# Patient Record
Sex: Female | Born: 1935
Health system: Southern US, Community
[De-identification: ages and names within clinical notes are randomized; demographics above are authoritative.]

## PROBLEM LIST (undated history)

## (undated) DIAGNOSIS — M810 Age-related osteoporosis without current pathological fracture: Secondary | ICD-10-CM

## (undated) DIAGNOSIS — E785 Hyperlipidemia, unspecified: Secondary | ICD-10-CM

## (undated) DIAGNOSIS — Z9889 Other specified postprocedural states: Secondary | ICD-10-CM

## (undated) DIAGNOSIS — E039 Hypothyroidism, unspecified: Secondary | ICD-10-CM

## (undated) DIAGNOSIS — E0590 Thyrotoxicosis, unspecified without thyrotoxic crisis or storm: Secondary | ICD-10-CM

## (undated) DIAGNOSIS — H3530 Unspecified macular degeneration: Secondary | ICD-10-CM

## (undated) DIAGNOSIS — I350 Nonrheumatic aortic (valve) stenosis: Secondary | ICD-10-CM

## (undated) DIAGNOSIS — B029 Zoster without complications: Secondary | ICD-10-CM

## (undated) DIAGNOSIS — M1990 Unspecified osteoarthritis, unspecified site: Secondary | ICD-10-CM

## (undated) DIAGNOSIS — R112 Nausea with vomiting, unspecified: Secondary | ICD-10-CM

## (undated) DIAGNOSIS — K219 Gastro-esophageal reflux disease without esophagitis: Secondary | ICD-10-CM

## (undated) DIAGNOSIS — J309 Allergic rhinitis, unspecified: Secondary | ICD-10-CM

## (undated) HISTORY — DX: Hyperlipidemia, unspecified: E78.5

## (undated) HISTORY — DX: Gastro-esophageal reflux disease without esophagitis: K21.9

## (undated) HISTORY — PX: TONSILLECTOMY: SUR1361

## (undated) HISTORY — DX: Age-related osteoporosis without current pathological fracture: M81.0

## (undated) HISTORY — DX: Unspecified macular degeneration: H35.30

## (undated) HISTORY — DX: Allergic rhinitis, unspecified: J30.9

## (undated) HISTORY — PX: OTHER SURGICAL HISTORY: SHX169

## (undated) HISTORY — DX: Nonrheumatic aortic (valve) stenosis: I35.0

## (undated) HISTORY — DX: Thyrotoxicosis, unspecified without thyrotoxic crisis or storm: E05.90

## (undated) HISTORY — PX: DILATION AND CURETTAGE OF UTERUS: SHX78

## (undated) HISTORY — DX: Hypothyroidism, unspecified: E03.9

---

## 1982-02-07 HISTORY — PX: ABDOMINAL HYSTERECTOMY: SHX81

## 1988-02-08 HISTORY — PX: JOINT REPLACEMENT: SHX530

## 1999-02-11 ENCOUNTER — Encounter: Admission: RE | Admit: 1999-02-11 | Discharge: 1999-02-11 | Payer: Self-pay | Admitting: Rheumatology

## 1999-02-11 ENCOUNTER — Encounter: Payer: Self-pay | Admitting: Rheumatology

## 2001-07-03 ENCOUNTER — Encounter: Admission: RE | Admit: 2001-07-03 | Discharge: 2001-07-03 | Payer: Self-pay | Admitting: Endocrinology

## 2001-07-03 ENCOUNTER — Encounter: Payer: Self-pay | Admitting: Surgery

## 2001-07-03 ENCOUNTER — Encounter: Payer: Self-pay | Admitting: Endocrinology

## 2001-07-03 ENCOUNTER — Encounter (INDEPENDENT_AMBULATORY_CARE_PROVIDER_SITE_OTHER): Payer: Self-pay | Admitting: Specialist

## 2001-07-03 ENCOUNTER — Inpatient Hospital Stay (HOSPITAL_COMMUNITY): Admission: AD | Admit: 2001-07-03 | Discharge: 2001-07-06 | Payer: Self-pay | Admitting: Surgery

## 2001-07-04 ENCOUNTER — Encounter: Payer: Self-pay | Admitting: Surgery

## 2003-02-08 HISTORY — PX: CHOLECYSTECTOMY: SHX55

## 2008-05-06 ENCOUNTER — Ambulatory Visit (HOSPITAL_COMMUNITY): Admission: RE | Admit: 2008-05-06 | Discharge: 2008-05-06 | Payer: Self-pay | Admitting: Pediatrics

## 2008-09-03 ENCOUNTER — Encounter: Admission: RE | Admit: 2008-09-03 | Discharge: 2008-09-03 | Payer: Self-pay | Admitting: Endocrinology

## 2009-12-14 ENCOUNTER — Inpatient Hospital Stay (HOSPITAL_COMMUNITY)
Admission: RE | Admit: 2009-12-14 | Discharge: 2009-12-18 | Payer: Self-pay | Source: Home / Self Care | Admitting: Orthopedic Surgery

## 2010-01-06 ENCOUNTER — Inpatient Hospital Stay (HOSPITAL_COMMUNITY)
Admission: EM | Admit: 2010-01-06 | Discharge: 2010-01-10 | Payer: Self-pay | Source: Home / Self Care | Admitting: Emergency Medicine

## 2010-02-07 HISTORY — PX: OTHER SURGICAL HISTORY: SHX169

## 2010-03-02 ENCOUNTER — Inpatient Hospital Stay (HOSPITAL_COMMUNITY)
Admission: RE | Admit: 2010-03-02 | Discharge: 2010-03-04 | Payer: Self-pay | Source: Home / Self Care | Attending: Orthopedic Surgery | Admitting: Orthopedic Surgery

## 2010-03-03 LAB — COMPREHENSIVE METABOLIC PANEL
ALT: 11 U/L (ref 0–35)
AST: 14 U/L (ref 0–37)
CO2: 27 mEq/L (ref 19–32)
Calcium: 8.9 mg/dL (ref 8.4–10.5)
Chloride: 104 mEq/L (ref 96–112)
Creatinine, Ser: 0.58 mg/dL (ref 0.4–1.2)
GFR calc non Af Amer: >60 mL/min (ref >60)
Glucose, Bld: 103 mg/dL — ABNORMAL HIGH (ref 70–99)
Total Bilirubin: 0.4 mg/dL (ref 0.3–1.2)

## 2010-03-03 LAB — CBC
MCH: 31.5 pg (ref 26.0–34.0)
MCHC: 32.4 g/dL (ref 30.0–36.0)
MCV: 97.3 fL (ref 78.0–100.0)
Platelets: 305 10*3/uL (ref 150–400)
RDW: 13.6 % (ref 11.5–15.5)

## 2010-03-03 LAB — URINALYSIS, ROUTINE W REFLEX MICROSCOPIC
Bilirubin Urine: NEGATIVE
Hgb urine dipstick: NEGATIVE
Ketones, ur: NEGATIVE mg/dL
Nitrite: NEGATIVE
Protein, ur: NEGATIVE mg/dL
Specific Gravity, Urine: 1.003 — ABNORMAL LOW (ref 1.005–1.030)
Urobilinogen, UA: 0.2 mg/dL (ref 0.0–1.0)

## 2010-03-03 LAB — SURGICAL PCR SCREEN: Staphylococcus aureus: NEGATIVE

## 2010-03-03 LAB — PROTIME-INR: Prothrombin Time: 13.7 seconds (ref 11.6–15.2)

## 2010-03-10 NOTE — Op Note (Signed)
[name], [name]              ACCOUNT NO.:  [id number]  MEDICAL RECORD NO.:  [id number]          PATIENT TYPE:  AMB  LOCATION:  DAY                          FACILITY:  [facility]  PHYSICIAN:  Ollen Gross, M.D.    DATE OF BIRTH:  May 01, 1935  DATE OF PROCEDURE:  03/02/2010 DATE OF DISCHARGE:                              OPERATIVE REPORT   PREOPERATIVE DIAGNOSIS:  Left patella periprosthetic fracture.  POSTOPERATIVE DIAGNOSIS:  Left patella periprosthetic fracture.  PROCEDURE:  Left patellar reconstruction.  SURGEON:  Ollen Gross, M.D.  ASSISTANT:  Avel Peace, PA-C  ANESTHESIA:  General.  ESTIMATED BLOOD LOSS:  Minimal.  DRAINS:  None.  TOURNIQUET TIME:  35 minutes for 300 mmHg.  COMPLICATIONS:  None.  CONDITION:  Stable to recovery.  BRIEF CLINICAL NOTE:  [name] is a 75 year old female, who had a left total knee arthroplasty done a couple of months ago.  She sustained a periprosthetic patella fracture about 2-3 weeks postoperative, history of open reduction and internal fixation, recently she noticed increased discomfort and inability to raise the leg.  It was noted that the repair disrupted and the superior fragment had migrated proximally. Unfortunately, this was a situation that could not the rectified without further operative treatment.  She presents now for repeat ORIF of the patella.  PROCEDURE IN DETAIL:  After successful administration of general anesthetic, a tourniquet was placed on her left thigh and left lower extremity was prepped and draped in the usual sterile fashion. Extremities were wrapped in Esmarch and tourniquet inflated to 300 mmHg. Previous incisions were reutilized, skin cut with 10 blade through subcutaneous tissue to the level of the extensor mechanism.  It was noted that there was a transverse tear in the retinaculum.  This was at the level of the fracture and coursing medial and lateral.  This was all tissue that had been  repaired previously.  We inspected the joint and thoroughly irrigated the joint to remove any hematoma.  I then removed the previous hardware.  We freshened up the edges of the bone on both sides.  I felt that due to the configuration of the fracture, any of the metal hardware was not going to stay given a small inferior fragment.  I decided to place suture anchors into the bone and then used the sutures to approximate the superior and inferior fragments.  We placed a Mitek super anchor in the superior fragment as well as on the inferior fragment.  We then passed the sutures through drill holes with the inferior sutures going through the holes in the superior fragment and the superior sutures going through the holes in the inferior fragment. We then reduced the two bony fragments.  There was fair amount of comminution involved, thus it was not perfectly imposed on the anterior side.  On the posterior aspect towards the joint, there is bone posing up and gap fully closed.  We then tied the sutures off and then tied them into each other with a very stable repair.  It was flexed down pass 90 and it was not gapping at all.  I then oversewed all of the  retinaculum with interrupted #1 Ethibond, again with flexion well pass 90 without this gapping at all.  The tourniquet was released total time of 35 minutes.  The subcutaneous tissues then closed with interrupted 2- 0 Vicryl subcuticular running 4-0 Monocryl.  The incision was then cleaned and dried and Steri-Strips and bulky sterile dressing were applied, placed in a knee immobilizer, awakened and transported to recovery in stable condition.     Ollen Gross, M.D.     FA/MEDQ  D:  03/02/2010  T:  03/02/2010  Job:  [id number]  Electronically Signed by Ollen Gross M.D. on 03/10/2010 03:36:55 PM

## 2010-04-20 LAB — CBC
HCT: 27 % — ABNORMAL LOW (ref 36.0–46.0)
HCT: 28.4 % — ABNORMAL LOW (ref 36.0–46.0)
HCT: 36.7 % (ref 36.0–46.0)
Hemoglobin: 11.5 g/dL — ABNORMAL LOW (ref 12.0–15.0)
Hemoglobin: 12.2 g/dL (ref 12.0–15.0)
MCH: 34.1 pg — ABNORMAL HIGH (ref 26.0–34.0)
MCH: 34.7 pg — ABNORMAL HIGH (ref 26.0–34.0)
MCH: 34.7 pg — ABNORMAL HIGH (ref 26.0–34.0)
MCHC: 33.5 g/dL (ref 30.0–36.0)
MCHC: 34.6 g/dL (ref 30.0–36.0)
MCV: 100.4 fL — ABNORMAL HIGH (ref 78.0–100.0)
MCV: 100.9 fL — ABNORMAL HIGH (ref 78.0–100.0)
MCV: 101.7 fL — ABNORMAL HIGH (ref 78.0–100.0)
RBC: 3.37 MIL/uL — ABNORMAL LOW (ref 3.87–5.11)
RDW: 13.1 % (ref 11.5–15.5)
RDW: 13.3 % (ref 11.5–15.5)
WBC: 7.8 10*3/uL (ref 4.0–10.5)
WBC: 8 10*3/uL (ref 4.0–10.5)
WBC: 9.1 10*3/uL (ref 4.0–10.5)

## 2010-04-20 LAB — DIFFERENTIAL
Lymphocytes Relative: 14 % (ref 12–46)
Monocytes Absolute: 0.7 10*3/uL (ref 0.1–1.0)
Monocytes Relative: 9 % (ref 3–12)
Neutro Abs: 6.2 10*3/uL (ref 1.7–7.7)

## 2010-04-20 LAB — BASIC METABOLIC PANEL
BUN: 8 mg/dL (ref 6–23)
BUN: 8 mg/dL (ref 6–23)
CO2: 25 mEq/L (ref 19–32)
CO2: 29 mEq/L (ref 19–32)
CO2: 30 mEq/L (ref 19–32)
Calcium: 8.3 mg/dL — ABNORMAL LOW (ref 8.4–10.5)
Chloride: 102 mEq/L (ref 96–112)
Chloride: 103 mEq/L (ref 96–112)
Chloride: 104 mEq/L (ref 96–112)
Chloride: 108 mEq/L (ref 96–112)
Creatinine, Ser: 0.56 mg/dL (ref 0.4–1.2)
GFR calc Af Amer: >60 mL/min (ref >60)
GFR calc non Af Amer: >60 mL/min (ref >60)
Glucose, Bld: 122 mg/dL — ABNORMAL HIGH (ref 70–99)
Glucose, Bld: 144 mg/dL — ABNORMAL HIGH (ref 70–99)
Glucose, Bld: 173 mg/dL — ABNORMAL HIGH (ref 70–99)
Glucose, Bld: 208 mg/dL — ABNORMAL HIGH (ref 70–99)
Potassium: 4 mEq/L (ref 3.5–5.1)
Potassium: 4.1 mEq/L (ref 3.5–5.1)
Potassium: 4.3 mEq/L (ref 3.5–5.1)
Sodium: 137 mEq/L (ref 135–145)
Sodium: 139 mEq/L (ref 135–145)
Sodium: 139 mEq/L (ref 135–145)

## 2010-04-20 LAB — TYPE AND SCREEN
ABO/RH(D): A POS
Antibody Screen: NEGATIVE

## 2010-04-20 LAB — PROTIME-INR: Prothrombin Time: 22.2 seconds — ABNORMAL HIGH (ref 11.6–15.2)

## 2010-04-21 LAB — CBC
HCT: 38.1 % (ref 36.0–46.0)
MCH: 34.4 pg — ABNORMAL HIGH (ref 26.0–34.0)
MCV: 100.7 fL — ABNORMAL HIGH (ref 78.0–100.0)
Platelets: 247 10*3/uL (ref 150–400)
RBC: 3.79 MIL/uL — ABNORMAL LOW (ref 3.87–5.11)
RDW: 12.9 % (ref 11.5–15.5)

## 2010-04-21 LAB — COMPREHENSIVE METABOLIC PANEL
ALT: 11 U/L (ref 0–35)
AST: 17 U/L (ref 0–37)
Calcium: 9.3 mg/dL (ref 8.4–10.5)
GFR calc Af Amer: >60 mL/min (ref >60)
Glucose, Bld: 101 mg/dL — ABNORMAL HIGH (ref 70–99)
Sodium: 143 mEq/L (ref 135–145)
Total Protein: 7 g/dL (ref 6.0–8.3)

## 2010-04-21 LAB — URINALYSIS, ROUTINE W REFLEX MICROSCOPIC
Glucose, UA: NEGATIVE mg/dL
Specific Gravity, Urine: 1.005 (ref 1.005–1.030)
pH: 6.5 (ref 5.0–8.0)

## 2010-04-21 LAB — SURGICAL PCR SCREEN
MRSA, PCR: NEGATIVE
Staphylococcus aureus: NEGATIVE

## 2010-04-21 LAB — PROTIME-INR: Prothrombin Time: 13.3 seconds (ref 11.6–15.2)

## 2010-06-25 NOTE — H&P (Signed)
[facility]  Patient:    [name], [name] Visit Number: [id number] MRN: [id number]          Service Type: MED Location: [phone] Attending Physician:  Katha Cabal Dictated by:   Thornton Park Daphine Deutscher, M.D. Admit Date:  07/03/2001   CC:         Alfonse Alpers. Dagoberto Ligas, M.D.  Aundra Dubin, M.D.   History and Physical  DATE OF BIRTH:  1935/05/02  CHIEF COMPLAINT:  Right upper quadrant pain.  HISTORY OF PRESENT ILLNESS:  The patient is a 75 year old lady who started having pain in the back on last Thursday before the [facility] Day weekend. This progressed to a stomach ache and abdominal pain.  She saw Dr. Dagoberto Ligas this morning, Tuesday, May 73, 2003, and was sent for a gallbladder ultrasound. This showed thickened gallbladder wall with a dilated gallbladder. Arrangements were made for her to be seen in the urgent office, and she is seen at about 6 p.m. today, and is felt to be in need for admission this evening for surgery in the morning.  At the present time, she is still having some tenderness in the right upper quadrant, but does not appear to be otherwise toxic.  A book explaining laparoscopic as well as open cholecystectomy was given to her.  Arrangements were made for her to be placed on the surgery schedule for in the morning at 8:30.  ALLERGIES:  The patient is allergic to SULFA DRUGS in childhood and cannot take CELEBREX.  CURRENT MEDICATIONS:  Premarin daily, Nexium daily, prednisone 2.5 mg twice a week, methotrexate seven pills once a week on Thursday.  PAST SURGICAL HISTORY:  Prior surgery includes a laparoscopy, vaginal hysterectomy, a right knee joint replacement, and wrist surgeries on both wrists.  PAST MEDICAL HISTORY:  Medical conditions include rheumatoid arthritis for which she occasionally takes steroids and is followed by Dr. Kellie Simmering.  She has a history of hyperthyroidism treated in 1983 with PTU and with  normal thyroid function studies.  Mild obesity with current weight of 211.  She has a dyslipidemia with an LDL of 167 for which she has been started on Lipitor.  PHYSICAL EXAMINATION:  GENERAL:  A pleasant lady in no acute distress, but is uncomfortable.  HEENT:  Unremarkable and there is no scleral icterus.  Pupils are equal, round and reactive to light.  NECK:  Supple.  CHEST:  Clear.  HEART:  Sinus rhythm without murmurs or gallops.  ABDOMEN:  Exam reveals the presence of a laparoscopy scar from the previous laparoscopy, but no prior laparotomy.  EXTREMITIES:  Full range of motion with prior knee surgery as mentioned.  LABORATORY DATA:  This morning showed a hemoglobin of 13.6 with a white count of 11,400.  Total bilirubin 0.5 with normal LFTs.  Cholesterol mildly elevated at 217.  IMPRESSION:  Cholecystitis in a patient with rheumatoid arthritis who also takes steroids.  PLAN:  Admit this evening for IV therapy including IV antibiotics, IV pain medications, hydration for laparoscopic cholecystectomy at 0830 hours on Wednesday, Jul 04, 2001. Dictated by:   Thornton Park Daphine Deutscher, M.D. Attending Physician:  Katha Cabal DD:  07/03/01 TD:  07/03/01 Job: [id number]

## 2010-06-25 NOTE — Discharge Summary (Signed)
[facility]  Patient:    [name], [name] Visit Number: [id number] MRN: [id number]          Service Type: MED Location: [phone] Attending Physician:  Katha Cabal Dictated by:   Thornton Park Daphine Deutscher, M.D. Admit Date:  07/03/2001 Discharge Date: 07/06/2001                             Discharge Summary  ADMITTING DIAGNOSIS:  Acute cholecystitis.  DISCHARGE DIAGNOSIS:  Acute and chronic cholecystitis.  PROCEDURE:  Laparoscopic cholecystectomy with intraoperative cholangiogram on Jul 04, 2001.  HOSPITAL COURSE:  [name] is a 75 year old lady with multiple comorbid conditions not limited to steroid-dependent rheumatoid arthritis.  She presented with a four-day history of abdominal pain and was felt to have acute and subacute cholecystitis.  She was admitted the evening before her surgery the next morning to the office with acute cholecystitis.  She was begun on IV antibiotics preop and carried on those postoperatively.  She underwent a successful laparoscopic cholecystectomy with intraoperative cholangiogram on May 28.  She had a modest amount of drainage postop that was all serosanguineous.  She was ready for discharge on Jul 06, 2001.  She was given Vicodin to take for pain and asked to return to the office in two to three weeks for routine follow-up.  FINAL DIAGNOSIS:  Acute and chronic cholecystitis. Dictated by:   Thornton Park Daphine Deutscher, M.D. Attending Physician:  Katha Cabal DD:  07/06/01 TD:  07/09/01 Job: [id number]

## 2010-06-25 NOTE — Op Note (Signed)
[facility]  Patient:    [name], [name] Visit Number: [id number] MRN: [id number]          Service Type: MED Location: [phone] Attending Physician:  Katha Cabal Dictated by:   Thornton Park Daphine Deutscher, M.D. Admit Date:  07/03/2001   CC:         Alfonse Alpers. Dagoberto Ligas, M.D.  Aundra Dubin, M.D.   Operative Report  PREOPERATIVE DIAGNOSIS:  Acute cholecystitis.  POSTOPERATIVE DIAGNOSIS:  Acute and subacute cholecystitis.  OPERATION:  Laparoscopic cholecystectomy with intraoperative cholangiogram.  SURGEON:  Thornton Park. Daphine Deutscher, M.D.  ASSISTANT:  Gita Kudo, M.D.  FINDINGS:  Markedly inflamed gallbladder with multiple stones and impacted stone in neck, cholangiogram showed good filling of the common bile duct and intrahepatic radicles with free flow into the duodenum.  Marked inflammatory changes and friable gallbladder and gallbladder bed.  DRAINS:  One Blake drain left in the subhepatic space.  INDICATION FOR PROCEDURE:   Mrs. Sallie is a 75 year old lady who takes steroids for rheumatoid arthritis who was admitted with a four day history of unrelenting right upper quadrant and abdominal pain and an ultrasound which showed gallbladder wall thickness.  She was placed on IV antibiotics and hydration and scheduled for cholecystectomy at the first available room on Jul 04, 2001.  The patient had an 8:30 time.  DESCRIPTION OF PROCEDURE:  She was taken to room one and given general anesthesia.  She received Rocephin preoperatively.  The abdomen was prepped with Betadine and draped sterilely.  A transverse incision was used going through an old laparoscopy incision in the infraumbilical area.  So we made a little longitudinal incision in the fascia and then through a pursestring suture inserted the Hasson cannula.  The abdomen was insufflated and three ports were placed in the upper abdomen.  A large mass of omentum was swept down  off the liver and was stuck to the gallbladder.  This was carefully peeled away from the gallbladder revealing a very ashen gray edematous gallbladder.  There was no frank necrosis or perforation and so I initially inserted the suction device into the fundus and decompressed this.  It had been quite tense and ungraspable before but with decompression I was able to grasp it and elevate it.  I then found the infundibulum to be fairly edematous and inflamed and somewhat fatty infiltrated.  I incised the peritoneum and was able to delineate the infundibulum, the cystic duct and the cystic duct junction with the gallbladder and the rest of Calots triangle.  Clipped was placed upon the gallbladder and I incised the cystic duct and milked out the impacted stone. These appeared to be cholesterol stones and the cystic duct was thus clear.  Reddick catheter was inserted percutaneously and placed into the common duct.  This was about 2 cm in the cystic duct and with the balloon up I did a dynamic cholangiogram which showed filling of the intrahepatic radicles as well as free flow into the duodenum.  The cystic duct was then triple clipped and divided. The cystic arteries were identified, doubly clipped and divided.  Then using a combination of the hook and the spatula, I took out this very edematous gallbladder.  The liver bed itself was quite viable and she did have probably more bleeding in that area than most but certainly went along with the inflammatory changes we encountered.  The gallbladder in the posterior wall was attenuated and entered and I used the scoop  to remove about 20 stones to prevent spillage.  We were then able to get the gallbladder out and place it in a bag which was hence brought out through the umbilicus without difficulty.  The gallbladder bed was reinspected and irrigated.  There was an oozing area up in the top of the fundus which I cauterized with the electrocautery and  then placed some Surgicel.  It was not actively bleeding.  No bowel leaks were noted.  The [place] drain was brought in through the lateral most port and placed in the gallbladder bed and along the liver.  Umbilical port was tied down under direct vision and a good secure closure of this was present. All the port sites were injected with 0.5% Sensorcaine and all irrigant was withdrawn.  The remaining portion of the abdominal contents appeared to be unaffected.  The omentum was placed back up in the gallbladder bed.  A Jackson-Pratt drain was secured with 3-0 Nylon and placed to drainage.  The patient seemed to tolerate the procedure well.  She did receive some Decadron preoperatively since she has been on chronic steroids.  Skin was closed with 4-0 Vicryl subcuticularly with Benzoin and Steri-Strips. The patient seemed to tolerate the procedure well and was taken to the recovery room prior to return back to her room.  FINAL DIAGNOSIS:  Acute and subacute cholecystitis, status post laparoscopic cholecystectomy with intraoperative cholangiogram. Dictated by:   Thornton Park Daphine Deutscher, M.D. Attending Physician:  Katha Cabal DD:  07/04/01 TD:  07/05/01 Job: [id number]

## 2010-08-20 ENCOUNTER — Other Ambulatory Visit: Payer: Self-pay | Admitting: Orthopedic Surgery

## 2010-08-20 ENCOUNTER — Encounter (HOSPITAL_COMMUNITY): Payer: BC Managed Care – PPO

## 2010-08-20 LAB — URINALYSIS, ROUTINE W REFLEX MICROSCOPIC
Glucose, UA: NEGATIVE mg/dL
Hgb urine dipstick: NEGATIVE
Ketones, ur: NEGATIVE mg/dL
Protein, ur: NEGATIVE mg/dL
Urobilinogen, UA: 0.2 mg/dL (ref 0.0–1.0)

## 2010-08-20 LAB — SURGICAL PCR SCREEN: MRSA, PCR: NEGATIVE

## 2010-08-20 LAB — CBC
HCT: 40 % (ref 36.0–46.0)
Hemoglobin: 12.6 g/dL (ref 12.0–15.0)
MCH: 31.7 pg (ref 26.0–34.0)
MCHC: 31.5 g/dL (ref 30.0–36.0)
RDW: 14.2 % (ref 11.5–15.5)

## 2010-08-20 LAB — COMPREHENSIVE METABOLIC PANEL
ALT: 8 U/L (ref 0–35)
Alkaline Phosphatase: 60 U/L (ref 39–117)
BUN: 10 mg/dL (ref 6–23)
CO2: 30 mEq/L (ref 19–32)
Chloride: 102 mEq/L (ref 96–112)
Glucose, Bld: 95 mg/dL (ref 70–99)
Potassium: 4.1 mEq/L (ref 3.5–5.1)
Sodium: 139 mEq/L (ref 135–145)
Total Bilirubin: 0.3 mg/dL (ref 0.3–1.2)

## 2010-08-20 LAB — PROTIME-INR: Prothrombin Time: 12.7 seconds (ref 11.6–15.2)

## 2010-08-27 ENCOUNTER — Inpatient Hospital Stay (HOSPITAL_COMMUNITY)
Admission: RE | Admit: 2010-08-27 | Discharge: 2010-08-29 | DRG: 227 | Disposition: A | Discharge status: Home Health | Payer: BC Managed Care – PPO | Source: Ambulatory Visit | Attending: Orthopedic Surgery | Admitting: Orthopedic Surgery

## 2010-08-27 DIAGNOSIS — M069 Rheumatoid arthritis, unspecified: Secondary | ICD-10-CM | POA: Diagnosis present

## 2010-08-27 DIAGNOSIS — Z01812 Encounter for preprocedural laboratory examination: Secondary | ICD-10-CM

## 2010-08-27 DIAGNOSIS — M66269 Spontaneous rupture of extensor tendons, unspecified lower leg: Principal | ICD-10-CM | POA: Diagnosis present

## 2010-08-27 DIAGNOSIS — Z96659 Presence of unspecified artificial knee joint: Secondary | ICD-10-CM

## 2010-08-27 DIAGNOSIS — K219 Gastro-esophageal reflux disease without esophagitis: Secondary | ICD-10-CM | POA: Diagnosis present

## 2010-08-30 NOTE — Op Note (Signed)
[name], [name]              ACCOUNT NO.:  [id number]  MEDICAL RECORD NO.:  [id number]  LOCATION:  1620                         FACILITY:  [facility]  PHYSICIAN:  Ollen Gross, M.D.    DATE OF BIRTH:  November 21, 1935  DATE OF PROCEDURE:  08/27/2010 DATE OF DISCHARGE:                              OPERATIVE REPORT   PREOPERATIVE DIAGNOSIS:  Chronic left patellar tendon rupture.  POSTOPERATIVE DIAGNOSIS:  Chronic left patellar tendon rupture.  PROCEDURE:  Left extensor mechanism repair.  SURGEON:  Ollen Gross, MD  ASSISTANT:  Alexzandrew L. Julien Girt, PAC  ANESTHESIA:  General.  ESTIMATED BLOOD LOSS:  Minimal.  DRAINS:  None.  TOURNIQUET TIME:  37 minutes at 300 mmHg.  COMPLICATIONS:  None.  CONDITION:  Stable to Recovery.  BRIEF CLINICAL NOTE:  [name] is a 75 year old female with long complex history in regard to left knee.  She had a primary total knee arthroplasty, then ended up having a patella fracture.  We attempted repair x2.  She went on to develop a secondary rupture of the extensor mechanism.  She presents now for attempted repair versus patellectomy.  PROCEDURE IN DETAIL:  After successful administration of general anesthetic, a tourniquet was placed high on the left thigh, and left lower extremity prepped and draped in usual sterile fashion.  Extremity was wrapped in an Esmarch, tourniquet inflated to 300 mmHg.  Previous midline incision was utilized to skin cut with #10 blade through subcutaneous tissue to the extensor mechanism.  There was obvious defect there.  Patella was riding high.  There was separation from a smaller inferior pole fragment.  I removed that smaller fragment.  There was a previous suture anchor in place which was also removed.  I then thoroughly irrigated the joint with saline.  With Metzenbaum scissors were used to lyse any adhesions in the joint.  I was then able to mobilize the patellar button down to its appropriate position in  the trochlear groove of the femoral component.  There was plenty of redundant tissue inferiorly overlying the tendon.  I mobilized the tendon and redundant tissue.  I felt that given the good mobility of the quadriceps mechanism and the patellar component that I will be able to repair this to the tissue inferiorly.  I felt there was enough tissue there also to do the primary repair and then reinforce it with tissue as opposed to repairing under tension.  We then took FiberWire suture #2 and did a Bunnell type weave, starting one suture from superomedial and one suture superolateral.  We took this through the end of the superior tissues and then sewed that into the inferior tissue applying tension, so that we would hold the patella down in a reduced position.  This effectively reduced it down to its anatomic position.  We tied the sutures then oversewed the tissue and then tied the remaining sutured to each other.  We then took redundant tissue and created a flap from inferior to superior.  This was sewed with the #2 Ethibond.  I felt we had a very stable repair.  I was able to flex against gravity 70 degrees without having any tension on the suture line.  I did not go beyond that given that this was a revision repair. We did further irrigation with saline.  The tourniquet was then released at total time of 37 minutes.  Subcutaneous was closed with interrupted 2- 0 Vicryl and subcuticular with running 4-0 Monocryl.  Incision was cleaned and dried and Steri-Strips and a bulky sterile dressing applied. She was then awakened and transported to Recovery in stable condition.     Ollen Gross, M.D.     FA/MEDQ  D:  08/27/2010  T:  08/28/2010  Job:  [id number]  Electronically Signed by Ollen Gross M.D. on 08/30/2010 04:42:28 PM

## 2010-09-20 NOTE — Discharge Summary (Signed)
  NAMEWILHELMINE, [name]              ACCOUNT NO.:  [id number]  MEDICAL RECORD NO.:  [id number]  LOCATION:  1620                         FACILITY:  [facility]  PHYSICIAN:  Ollen Gross, M.D.    DATE OF BIRTH:  [birth year]  DATE OF ADMISSION:  08/27/2010 DATE OF DISCHARGE:  08/29/2010                              DISCHARGE SUMMARY   ADMITTING DIAGNOSES: 1. Chronic left patellar tendon rupture. 2. History of tobacco use. 3. Hiatal hernia. 4. Reflux disease. 5. Chronic steroids. 6. Rheumatoid arthritis. 7. Postmenopausal. 8. Hyperlipidemia.  DISCHARGE DIAGNOSES: 1. Chronic left patellar tendon rupture, status post left extensor     mechanism repair. 2. History of tobacco use. 3. Hiatal hernia. 4. Reflux disease. 5. Chronic steroids. 6. Rheumatoid arthritis. 7. Postmenopausal. 8. Hyperlipidemia.  PROCEDURE:  On August 27, 2010, left knee extensor mechanism repair. Surgeon - Dr. Lequita Halt.  Assistant - Alexzandrew Julien Girt, P.A.C. Anesthesia - General.  Tourniquet Time - 37 minutes.  CONSULTS:  None.  BRIEF HISTORY:  Ms. [name] is a 75 year old female with a long complex history with regard to her left knee, had a primary total knee and then had a patellar fracture attempted repair twice.  Now, presents for a repair versus patellectomy.  LABORATORY DATA:  No labs taken.  HOSPITAL COURSE:  The patient was admitted to [facility], taken to OR, underwent above-stated procedure without complication.  The patient tolerated the procedure well.  Later, transferred to recovery room in orthopedic floor, started on p.o. and IV analgesics.  She has actually done pretty well on the morning of day #1, encouraged p.o. meds, and allowed to be up out of bed, weightbearing as tolerated with knee immobilizer at all times.  By day #2, she was doing well.  She had been up walking with PT.  We changed the dressing, incision looked good, and she did well with therapy and was later  discharged home that day.  DISCHARGE/PLAN: 1. The patient was discharged home on August 29, 2010. 2. Discharge diagnoses, please see above. 3. Discharge meds:  OxyIR, Robaxin.  Continue home meds. 4. Follow up in 2 weeks. 5. Activity:  Weightbearing as tolerated.  No motion.  Knee     immobilizer at all times.  DISPOSITION:  Home.  CONDITION UPON DISCHARGE:  Improved.     Alexzandrew L. Julien Girt, P.A.C.   ______________________________ Ollen Gross, M.D.    ALP/MEDQ  D:  09/09/2010  T:  09/10/2010  Job:  [id number]  Electronically Signed by Patrica Duel P.A.C. on 09/10/2010 09:41:29 AM Electronically Signed by Ollen Gross M.D. on 09/20/2010 11:36:01 AM

## 2010-09-20 NOTE — H&P (Signed)
  [name], [name]              ACCOUNT NO.:  [id number]  MEDICAL RECORD NO.:  [id number]  LOCATION:  1620                         FACILITY:  [facility]  PHYSICIAN:  Ollen Gross, M.D.    DATE OF BIRTH:  Jan 08, 1936  DATE OF ADMISSION:  08/27/2010 DATE OF DISCHARGE:  08/29/2010                             HISTORY & PHYSICAL   CHIEF COMPLAINT:  Left knee pain.  HISTORY OF PRESENT ILLNESS:  The patient is a 75 year old female with long complex history in regards to her left knee.  She had a primary total knee and had a patella fracture.  We attempted to repair twice. She has had a secondary rupture of the extensor mechanism and now is admitted for repair versus patellectomy.  ALLERGIES:  SULFA DRUGS, MORPHINE, DILAUDID; nausea, causes insomnia.  CURRENT MEDICATIONS:  Folic acid, Nexium, Crestor, methotrexate, Enbrel.  PAST MEDICAL HISTORY.:  Past history of tobacco use, hiatal hernia, reflux disease, chronic steroids, rheumatoid arthritis, postmenopausal hyperlipidemia.  PAST SURGICAL HISTORY:  Right total knee arthroplasty, hysterectomy, left total knee arthroplasty, right wrist surgery, left hand surgery, cholecystectomy, and attempted repair of patella tendon ruptures.  FAMILY HISTORY:  Father passed at 44 of colon cancer.  Mother passed at 47 of throat cancer.  SOCIAL HISTORY:  Divorced.  Previous smoker, infrequent alcohol, 3 children.  REVIEW OF SYSTEMS:  GENERAL:  No fevers, chills, or night sweats. NEURO:  No seizures or paralysis.  RESPIRATORY:  No shortness breath, productive cough, or hemoptysis.  CARDIOVASCULAR:  No chest pain or orthopnea.  GI: No nausea, vomiting, diarrhea, or constipation.  GU: No dysuria, hematuria, or discharge.  MUSCULOSKELETAL:  Knee pain.  PHYSICAL EXAM:  VITAL SIGNS:  Temperature 97.3, pulse 81, respirations 16, blood pressure 155/88. GENERAL:  The patient is 75 year old white female, well-nourished, well- developed, no acute distress,  alert, cooperative, but overweight. HEENT:  Normocephalic, atraumatic.  Pupils are reactive.  EOMs intact. NECK:  Supple. CHEST:  Clear. HEART:  Regular rate and rhythm. ABDOMEN:  Round, soft, protuberant abdomen. Bowel sounds present. RESPIRATORY EXAM:  Not done per the patient. EXTREMITIES:  Left knee; no effusion. Anterior incision well healed. Lacks extensor mechanism strength.  IMPRESSION:  Ruptured left knee extensor mechanism.  PLAN:  Admit for a patellectomy versus tendon repair.     Ollen Gross, M.D.     FA/MEDQ  D:  09/09/2010  T:  09/10/2010  Job:  [id number]  Electronically Signed by Ollen Gross M.D. on 09/20/2010 11:35:58 AM

## 2011-04-28 ENCOUNTER — Other Ambulatory Visit: Payer: Self-pay | Admitting: Orthopedic Surgery

## 2011-07-14 ENCOUNTER — Encounter (HOSPITAL_COMMUNITY): Payer: Self-pay | Admitting: Pharmacy Technician

## 2011-07-15 ENCOUNTER — Encounter (HOSPITAL_COMMUNITY)
Admission: RE | Admit: 2011-07-15 | Discharge: 2011-07-15 | Disposition: A | Discharge status: Home / Self Care | Payer: BC Managed Care – PPO | Source: Ambulatory Visit | Attending: Orthopedic Surgery | Admitting: Orthopedic Surgery

## 2011-07-15 ENCOUNTER — Encounter (HOSPITAL_COMMUNITY): Payer: Self-pay

## 2011-07-15 HISTORY — DX: Unspecified osteoarthritis, unspecified site: M19.90

## 2011-07-15 HISTORY — DX: Nausea with vomiting, unspecified: R11.2

## 2011-07-15 HISTORY — DX: Other specified postprocedural states: Z98.890

## 2011-07-15 LAB — URINALYSIS, ROUTINE W REFLEX MICROSCOPIC
Leukocytes, UA: NEGATIVE
Protein, ur: NEGATIVE mg/dL
Urobilinogen, UA: 0.2 mg/dL (ref 0.0–1.0)

## 2011-07-15 LAB — CBC
HCT: 40.9 % (ref 36.0–46.0)
Hemoglobin: 13.2 g/dL (ref 12.0–15.0)
MCH: 32.4 pg (ref 26.0–34.0)
MCHC: 32.3 g/dL (ref 30.0–36.0)

## 2011-07-15 LAB — COMPREHENSIVE METABOLIC PANEL
BUN: 14 mg/dL (ref 6–23)
Calcium: 9.7 mg/dL (ref 8.4–10.5)
GFR calc Af Amer: >90 mL/min (ref >90)
Glucose, Bld: 97 mg/dL (ref 70–99)
Sodium: 136 mEq/L (ref 135–145)
Total Protein: 7 g/dL (ref 6.0–8.3)

## 2011-07-15 LAB — PROTIME-INR: Prothrombin Time: 12.8 seconds (ref 11.6–15.2)

## 2011-07-15 LAB — SURGICAL PCR SCREEN: Staphylococcus aureus: NEGATIVE

## 2011-07-15 NOTE — Pre-Procedure Instructions (Signed)
Reviewed pre-op instructions using Teach back method. 

## 2011-07-15 NOTE — Patient Instructions (Signed)
[address] FE [place]  07/15/2011   Your procedure is scheduled on:  Thursday 07/21/2011 at 1230pm  Report to [facility] at 1000 AM.  Call this number if you have problems the morning of surgery: [phone]   Remember:   Do not eat food:After Midnight.  May have clear liquids:until Midnight .    Take these medicines the morning of surgery with A SIP OF WATER: Crestor   Do not wear jewelry, make-up or nail polish.  Do not wear lotions, powders, or perfumes.   Do not shave 48 hours prior to surgery. Men may shave face and neck.  Do not bring valuables to the hospital.  Contacts, dentures or bridgework may not be worn into surgery.  Leave suitcase in the car. After surgery it may be brought to your room.  For patients admitted to the hospital, checkout time is 11:00 AM the day of discharge.      Special Instructions: CHG Shower Use Special Wash: 1/2 bottle night before surgery and 1/2 bottle morning of surgery.   Please read over the following fact sheets that you were given: MRSA Information, sleep apnea sheet, incentive spirometry sheet Blood transfusion sheet                 IF you have any questions, please call Telford Nab.Davette Nugent,RN,BSN at [phone]

## 2011-07-21 ENCOUNTER — Inpatient Hospital Stay (HOSPITAL_COMMUNITY)
Admission: AD | Admit: 2011-07-21 | Discharge: 2011-07-23 | DRG: 222 | Disposition: A | Discharge status: Home Health | Payer: BC Managed Care – PPO | Source: Ambulatory Visit | Attending: Orthopedic Surgery | Admitting: Orthopedic Surgery

## 2011-07-21 ENCOUNTER — Encounter (HOSPITAL_COMMUNITY): Payer: Self-pay | Admitting: *Deleted

## 2011-07-21 ENCOUNTER — Ambulatory Visit (HOSPITAL_COMMUNITY): Payer: BC Managed Care – PPO | Admitting: Anesthesiology

## 2011-07-21 ENCOUNTER — Encounter (HOSPITAL_COMMUNITY): Payer: Self-pay | Admitting: Anesthesiology

## 2011-07-21 ENCOUNTER — Encounter (HOSPITAL_COMMUNITY)
Admission: AD | Disposition: A | Discharge status: Home Health | Payer: Self-pay | Source: Ambulatory Visit | Attending: Orthopedic Surgery

## 2011-07-21 DIAGNOSIS — Z79899 Other long term (current) drug therapy: Secondary | ICD-10-CM

## 2011-07-21 DIAGNOSIS — Z87891 Personal history of nicotine dependence: Secondary | ICD-10-CM

## 2011-07-21 DIAGNOSIS — M069 Rheumatoid arthritis, unspecified: Secondary | ICD-10-CM | POA: Diagnosis present

## 2011-07-21 DIAGNOSIS — S86819A Strain of other muscle(s) and tendon(s) at lower leg level, unspecified leg, initial encounter: Secondary | ICD-10-CM | POA: Diagnosis present

## 2011-07-21 DIAGNOSIS — Z01812 Encounter for preprocedural laboratory examination: Secondary | ICD-10-CM

## 2011-07-21 DIAGNOSIS — M66269 Spontaneous rupture of extensor tendons, unspecified lower leg: Secondary | ICD-10-CM | POA: Diagnosis present

## 2011-07-21 DIAGNOSIS — Z96659 Presence of unspecified artificial knee joint: Secondary | ICD-10-CM

## 2011-07-21 HISTORY — PX: PATELLAR TENDON REPAIR: SHX737

## 2011-07-21 LAB — TYPE AND SCREEN: ABO/RH(D): A POS

## 2011-07-21 SURGERY — REPAIR, TENDON, PATELLAR
Anesthesia: General | Site: Knee | Laterality: Left | Wound class: Clean

## 2011-07-21 MED ORDER — METHOCARBAMOL 500 MG PO TABS
500.0000 mg | ORAL_TABLET | Freq: Four times a day (QID) | ORAL | Status: DC | PRN
  Administered 2011-07-21 – 2011-07-23 (×5): 500 mg via ORAL
  Filled 2011-07-21 (×5): qty 1

## 2011-07-21 MED ORDER — CEFAZOLIN SODIUM 1-5 GM-% IV SOLN
1.0000 g | Freq: Four times a day (QID) | INTRAVENOUS | Status: AC
  Administered 2011-07-21 – 2011-07-22 (×3): 1 g via INTRAVENOUS
  Filled 2011-07-21 (×3): qty 50

## 2011-07-21 MED ORDER — CEFAZOLIN SODIUM-DEXTROSE 2-3 GM-% IV SOLR
2.0000 g | INTRAVENOUS | Status: AC
  Administered 2011-07-21: 2 g via INTRAVENOUS

## 2011-07-21 MED ORDER — ATORVASTATIN CALCIUM 20 MG PO TABS
20.0000 mg | ORAL_TABLET | Freq: Every day | ORAL | Status: DC
  Filled 2011-07-21 (×3): qty 1

## 2011-07-21 MED ORDER — PROMETHAZINE HCL 25 MG/ML IJ SOLN
INTRAMUSCULAR | Status: AC
  Filled 2011-07-21: qty 1

## 2011-07-21 MED ORDER — LACTATED RINGERS IV SOLN: INTRAVENOUS | Status: DC

## 2011-07-21 MED ORDER — ETOMIDATE 2 MG/ML IV SOLN
INTRAVENOUS | Status: DC | PRN
  Administered 2011-07-21: 10 mg via INTRAVENOUS

## 2011-07-21 MED ORDER — PROPOFOL 10 MG/ML IV EMUL
INTRAVENOUS | Status: DC | PRN
  Administered 2011-07-21: 100 mg via INTRAVENOUS

## 2011-07-21 MED ORDER — OXYMETAZOLINE HCL 0.05 % NA SOLN: 2.0000 | Freq: Two times a day (BID) | NASAL | Status: DC | PRN

## 2011-07-21 MED ORDER — TEMAZEPAM 15 MG PO CAPS: 15.0000 mg | ORAL_CAPSULE | Freq: Every evening | ORAL | Status: DC | PRN

## 2011-07-21 MED ORDER — PREDNISONE 20 MG PO TABS
20.0000 mg | ORAL_TABLET | Freq: Two times a day (BID) | ORAL | Status: AC
  Administered 2011-07-22 (×2): 20 mg via ORAL
  Filled 2011-07-21 (×2): qty 1

## 2011-07-21 MED ORDER — DOCUSATE SODIUM 100 MG PO CAPS
100.0000 mg | ORAL_CAPSULE | Freq: Two times a day (BID) | ORAL | Status: DC
  Administered 2011-07-21 – 2011-07-23 (×4): 100 mg via ORAL

## 2011-07-21 MED ORDER — PREDNISONE 5 MG PO TABS
5.0000 mg | ORAL_TABLET | Freq: Two times a day (BID) | ORAL | Status: DC
  Filled 2011-07-21: qty 1

## 2011-07-21 MED ORDER — FLEET ENEMA 7-19 GM/118ML RE ENEM: 1.0000 | ENEMA | Freq: Once | RECTAL | Status: AC | PRN

## 2011-07-21 MED ORDER — METOCLOPRAMIDE HCL 5 MG/ML IJ SOLN: 5.0000 mg | Freq: Three times a day (TID) | INTRAMUSCULAR | Status: DC | PRN

## 2011-07-21 MED ORDER — HYDROMORPHONE HCL PF 1 MG/ML IJ SOLN
0.2500 mg | INTRAMUSCULAR | Status: DC | PRN
  Administered 2011-07-21 (×4): 0.5 mg via INTRAVENOUS

## 2011-07-21 MED ORDER — BUPIVACAINE 0.25 % ON-Q PUMP SINGLE CATH 300ML
300.0000 mL | INJECTION | Status: DC
  Filled 2011-07-21: qty 300

## 2011-07-21 MED ORDER — ACETAMINOPHEN 10 MG/ML IV SOLN
INTRAVENOUS | Status: AC
  Filled 2011-07-21: qty 100

## 2011-07-21 MED ORDER — PROMETHAZINE HCL 25 MG/ML IJ SOLN
6.2500 mg | INTRAMUSCULAR | Status: DC | PRN
  Administered 2011-07-21: 6.25 mg via INTRAVENOUS

## 2011-07-21 MED ORDER — FENTANYL CITRATE 0.05 MG/ML IJ SOLN
INTRAMUSCULAR | Status: DC | PRN
  Administered 2011-07-21: 50 ug via INTRAVENOUS
  Administered 2011-07-21: 25 ug via INTRAVENOUS
  Administered 2011-07-21: 50 ug via INTRAVENOUS
  Administered 2011-07-21: 25 ug via INTRAVENOUS

## 2011-07-21 MED ORDER — METHOCARBAMOL 100 MG/ML IJ SOLN
500.0000 mg | Freq: Four times a day (QID) | INTRAMUSCULAR | Status: DC | PRN
  Administered 2011-07-21: 500 mg via INTRAVENOUS
  Filled 2011-07-21: qty 5

## 2011-07-21 MED ORDER — HYDROMORPHONE HCL PF 1 MG/ML IJ SOLN
INTRAMUSCULAR | Status: AC
  Filled 2011-07-21: qty 1

## 2011-07-21 MED ORDER — FENTANYL CITRATE 0.05 MG/ML IJ SOLN
INTRAMUSCULAR | Status: AC
  Filled 2011-07-21: qty 2

## 2011-07-21 MED ORDER — LACTATED RINGERS IV SOLN
INTRAVENOUS | Status: DC | PRN
  Administered 2011-07-21 (×2): via INTRAVENOUS

## 2011-07-21 MED ORDER — SODIUM CHLORIDE 0.9 % IV SOLN: INTRAVENOUS | Status: DC

## 2011-07-21 MED ORDER — BISACODYL 10 MG RE SUPP: 10.0000 mg | Freq: Every day | RECTAL | Status: DC | PRN

## 2011-07-21 MED ORDER — PREDNISONE 5 MG PO TABS: 5.0000 mg | ORAL_TABLET | Freq: Every day | ORAL | Status: DC

## 2011-07-21 MED ORDER — ACETAMINOPHEN 10 MG/ML IV SOLN
1000.0000 mg | Freq: Once | INTRAVENOUS | Status: AC
  Administered 2011-07-21: 1000 mg via INTRAVENOUS
  Filled 2011-07-21: qty 100

## 2011-07-21 MED ORDER — PREDNISONE 10 MG PO TABS
10.0000 mg | ORAL_TABLET | Freq: Two times a day (BID) | ORAL | Status: AC
  Administered 2011-07-23: 10 mg via ORAL
  Filled 2011-07-21 (×3): qty 1

## 2011-07-21 MED ORDER — HYDROMORPHONE HCL PF 1 MG/ML IJ SOLN
0.5000 mg | INTRAMUSCULAR | Status: DC | PRN
  Administered 2011-07-21: 0.5 mg via INTRAVENOUS
  Filled 2011-07-21: qty 1

## 2011-07-21 MED ORDER — POLYETHYLENE GLYCOL 3350 17 G PO PACK: 17.0000 g | PACK | Freq: Every day | ORAL | Status: DC | PRN

## 2011-07-21 MED ORDER — METOCLOPRAMIDE HCL 10 MG PO TABS: 5.0000 mg | ORAL_TABLET | Freq: Three times a day (TID) | ORAL | Status: DC | PRN

## 2011-07-21 MED ORDER — FENTANYL CITRATE 0.05 MG/ML IJ SOLN
25.0000 ug | INTRAMUSCULAR | Status: AC | PRN
  Administered 2011-07-21 (×6): 25 ug via INTRAVENOUS

## 2011-07-21 MED ORDER — ONDANSETRON HCL 4 MG/2ML IJ SOLN: 4.0000 mg | Freq: Four times a day (QID) | INTRAMUSCULAR | Status: DC | PRN

## 2011-07-21 MED ORDER — OXYCODONE HCL 5 MG PO TABS
5.0000 mg | ORAL_TABLET | ORAL | Status: DC | PRN
  Administered 2011-07-21 – 2011-07-23 (×13): 10 mg via ORAL
  Filled 2011-07-21 (×13): qty 2

## 2011-07-21 MED ORDER — DEXAMETHASONE SODIUM PHOSPHATE 10 MG/ML IJ SOLN
10.0000 mg | Freq: Once | INTRAMUSCULAR | Status: AC
  Administered 2011-07-21: 10 mg via INTRAVENOUS
  Filled 2011-07-21: qty 1

## 2011-07-21 MED ORDER — CEFAZOLIN SODIUM-DEXTROSE 2-3 GM-% IV SOLR
INTRAVENOUS | Status: AC
  Filled 2011-07-21: qty 50

## 2011-07-21 MED ORDER — ENOXAPARIN SODIUM 40 MG/0.4ML ~~LOC~~ SOLN
40.0000 mg | SUBCUTANEOUS | Status: DC
  Administered 2011-07-22 – 2011-07-23 (×2): 40 mg via SUBCUTANEOUS
  Filled 2011-07-21 (×3): qty 0.4

## 2011-07-21 MED ORDER — 0.9 % SODIUM CHLORIDE (POUR BTL) OPTIME
TOPICAL | Status: DC | PRN
  Administered 2011-07-21: 1000 mL

## 2011-07-21 MED ORDER — KCL IN DEXTROSE-NACL 20-5-0.9 MEQ/L-%-% IV SOLN
INTRAVENOUS | Status: DC
  Administered 2011-07-21: 18:00:00 via INTRAVENOUS
  Filled 2011-07-21 (×2): qty 1000

## 2011-07-21 MED ORDER — PREDNISONE 20 MG PO TABS
20.0000 mg | ORAL_TABLET | Freq: Every day | ORAL | Status: AC
  Administered 2011-07-21: 20 mg via ORAL
  Filled 2011-07-21: qty 1

## 2011-07-21 MED ORDER — ONDANSETRON HCL 4 MG PO TABS
4.0000 mg | ORAL_TABLET | Freq: Four times a day (QID) | ORAL | Status: DC | PRN
  Administered 2011-07-22: 4 mg via ORAL
  Filled 2011-07-21: qty 1

## 2011-07-21 MED ORDER — ACETAMINOPHEN 10 MG/ML IV SOLN
1000.0000 mg | Freq: Four times a day (QID) | INTRAVENOUS | Status: AC
  Administered 2011-07-21 – 2011-07-22 (×4): 1000 mg via INTRAVENOUS
  Filled 2011-07-21 (×6): qty 100

## 2011-07-21 MED ORDER — MEPERIDINE HCL 50 MG/ML IJ SOLN: 6.2500 mg | INTRAMUSCULAR | Status: DC | PRN

## 2011-07-21 SURGICAL SUPPLY — 56 items
BAG SPEC THK2 15X12 ZIP CLS (MISCELLANEOUS) ×1
BAG ZIPLOCK 12X15 (MISCELLANEOUS) ×2 IMPLANT
BANDAGE ELASTIC 6 VELCRO ST LF (GAUZE/BANDAGES/DRESSINGS) ×1 IMPLANT
BANDAGE ESMARK 6X9 LF (GAUZE/BANDAGES/DRESSINGS) ×1 IMPLANT
BANDAGE GAUZE ELAST BULKY 4 IN (GAUZE/BANDAGES/DRESSINGS) ×1 IMPLANT
BIT DRILL 2.0MX128MM (BIT) ×2 IMPLANT
BIT DRILL 2.8X128 (BIT) ×2 IMPLANT
BNDG CMPR 9X6 STRL LF SNTH (GAUZE/BANDAGES/DRESSINGS) ×1
BNDG COHESIVE 6X5 TAN STRL LF (GAUZE/BANDAGES/DRESSINGS) ×1 IMPLANT
BNDG ESMARK 6X9 LF (GAUZE/BANDAGES/DRESSINGS) ×2
CLOTH BEACON ORANGE TIMEOUT ST (SAFETY) ×2 IMPLANT
DECANTER SPIKE VIAL GLASS SM (MISCELLANEOUS) ×1 IMPLANT
DRAPE U-SHAPE 47X51 STRL (DRAPES) ×2 IMPLANT
DRILL BIT 7/64X5 (BIT) ×1 IMPLANT
DRSG ADAPTIC 3X8 NADH LF (GAUZE/BANDAGES/DRESSINGS) ×1 IMPLANT
DRSG EMULSION OIL 3X16 NADH (GAUZE/BANDAGES/DRESSINGS) ×2 IMPLANT
DRSG PAD ABDOMINAL 8X10 ST (GAUZE/BANDAGES/DRESSINGS) ×2 IMPLANT
DURAPREP 26ML APPLICATOR (WOUND CARE) ×2 IMPLANT
ELECT REM PT RETURN 9FT ADLT (ELECTROSURGICAL)
ELECTRODE REM PT RTRN 9FT ADLT (ELECTROSURGICAL) ×1 IMPLANT
GLOVE BIO SURGEON STRL SZ7.5 (GLOVE) ×2 IMPLANT
GLOVE BIO SURGEON STRL SZ8 (GLOVE) ×2 IMPLANT
GLOVE BIOGEL PI IND STRL 8 (GLOVE) ×2 IMPLANT
GLOVE BIOGEL PI INDICATOR 8 (GLOVE) ×2
GOWN STRL NON-REIN LRG LVL3 (GOWN DISPOSABLE) ×2 IMPLANT
GOWN STRL REIN XL XLG (GOWN DISPOSABLE) ×2 IMPLANT
GRAFT ACHILLES CALC BNE BLCK (Bone Implant) IMPLANT
GRAFT ACHILLES TENDON (Bone Implant) ×2 IMPLANT
IMMOBILIZER KNEE 20 (SOFTGOODS) ×2
IMMOBILIZER KNEE 20 THIGH 36 (SOFTGOODS) IMPLANT
MANIFOLD NEPTUNE II (INSTRUMENTS) ×2 IMPLANT
NDL MA TROC 1/2 (NEEDLE) ×1 IMPLANT
NEEDLE MA TROC 1/2 (NEEDLE) IMPLANT
PACK TOTAL JOINT (CUSTOM PROCEDURE TRAY) ×2 IMPLANT
PADDING CAST COTTON 6X4 STRL (CAST SUPPLIES) ×1 IMPLANT
PASSER SUT SWANSON 36MM LOOP (INSTRUMENTS) ×2 IMPLANT
POSITIONER SURGICAL ARM (MISCELLANEOUS) ×2 IMPLANT
SCREW CANC PT 4.0X28 (Screw) ×1 IMPLANT
SCREW CANC PT 4.0X35 (Screw) ×1 IMPLANT
SPONGE GAUZE 4X4 12PLY (GAUZE/BANDAGES/DRESSINGS) ×2 IMPLANT
SPONGE LAP 18X18 X RAY DECT (DISPOSABLE) ×3 IMPLANT
SPONGE LAP 4X18 X RAY DECT (DISPOSABLE) ×2 IMPLANT
STRIP CLOSURE SKIN 1/2X4 (GAUZE/BANDAGES/DRESSINGS) ×1 IMPLANT
SUT ETHIBOND NAB CT1 #1 30IN (SUTURE) ×6 IMPLANT
SUT FIBERWIRE #2 38 T-5 BLUE (SUTURE) ×2
SUT MNCRL AB 4-0 PS2 18 (SUTURE) ×2 IMPLANT
SUT VIC AB 0 CT1 27 (SUTURE)
SUT VIC AB 0 CT1 27XBRD ANTBC (SUTURE) ×2 IMPLANT
SUT VIC AB 1 CT1 27 (SUTURE) ×4
SUT VIC AB 1 CT1 27XBRD ANTBC (SUTURE) ×1 IMPLANT
SUT VIC AB 2-0 CT1 27 (SUTURE) ×10
SUT VIC AB 2-0 CT1 TAPERPNT 27 (SUTURE) ×1 IMPLANT
SUTURE FIBERWR #2 38 T-5 BLUE (SUTURE) ×1 IMPLANT
TOWEL OR 17X26 10 PK STRL BLUE (TOWEL DISPOSABLE) ×4 IMPLANT
WASHER 7MM DIA (Washer) ×2 IMPLANT
WATER STERILE IRR 1500ML POUR (IV SOLUTION) ×1 IMPLANT

## 2011-07-21 NOTE — Plan of Care (Signed)
Problem: Diagnosis - Type of Surgery Goal: General Surgical Patient Education (See Patient Education module for education specifics) Left patella tendon repair

## 2011-07-21 NOTE — Anesthesia Preprocedure Evaluation (Addendum)
Anesthesia Evaluation  Patient identified by MRN, date of birth, ID band Patient awake    Reviewed: Allergy & Precautions, H&P , NPO status , Patient's Chart, lab work & pertinent test results  History of Anesthesia Complications (+) PONV  Airway Mallampati: II TM Distance: >3 FB Neck ROM: Full    Dental No notable dental hx. (+) Dental Advisory Given   Pulmonary neg pulmonary ROS,  breath sounds clear to auscultation  Pulmonary exam normal       Cardiovascular negative cardio ROS  Rhythm:Regular Rate:Normal     Neuro/Psych negative neurological ROS  negative psych ROS   GI/Hepatic negative GI ROS, Neg liver ROS,   Endo/Other  negative endocrine ROS  Renal/GU negative Renal ROS  negative genitourinary   Musculoskeletal negative musculoskeletal ROS (+) Arthritis -, Rheumatoid disorders,    Abdominal   Peds negative pediatric ROS (+)  Hematology negative hematology ROS (+)   Anesthesia Other Findings 3 perm bridges. Front teeth bonded  Reproductive/Obstetrics negative OB ROS                          Anesthesia Physical Anesthesia Plan  ASA: II  Anesthesia Plan: General   Post-op Pain Management:    Induction: Intravenous  Airway Management Planned: LMA  Additional Equipment:   Intra-op Plan:   Post-operative Plan: Extubation in OR  Informed Consent: I have reviewed the patients History and Physical, chart, labs and discussed the procedure including the risks, benefits and alternatives for the proposed anesthesia with the patient or authorized representative who has indicated his/her understanding and acceptance.   Dental advisory given  Plan Discussed with: CRNA  Anesthesia Plan Comments:         Anesthesia Quick Evaluation

## 2011-07-21 NOTE — Brief Op Note (Signed)
07/21/2011  2:41 PM  PATIENT:  [name]  76 y.o. 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  Gus Rankin Floris Neuhaus, MD    07/21/2011, 2:47 PM

## 2011-07-21 NOTE — Transfer of Care (Signed)
Immediate Anesthesia Transfer of Care Note  Patient: [name]  Procedure(s) Performed: Procedure(s) (LRB): PATELLA TENDON REPAIR (Left)  Patient Location: PACU  Anesthesia Type: General  Level of Consciousness: awake, alert , sedated and patient cooperative  Airway & Oxygen Therapy: Patient Spontanous Breathing and Patient connected to face mask oxygen  Post-op Assessment: Report given to PACU RN and Post -op Vital signs reviewed and stable  Post vital signs: Reviewed and stable  Complications: No apparent anesthesia complications

## 2011-07-21 NOTE — Anesthesia Postprocedure Evaluation (Signed)
  Anesthesia Post-op Note  Patient: [name]  Procedure(s) Performed: Procedure(s) (LRB): PATELLA TENDON REPAIR (Left)  Patient Location: PACU  Anesthesia Type: General  Level of Consciousness: awake and alert   Airway and Oxygen Therapy: Patient Spontanous Breathing  Post-op Pain: mild  Post-op Assessment: Post-op Vital signs reviewed, Patient's Cardiovascular Status Stable, Respiratory Function Stable, Patent Airway and No signs of Nausea or vomiting  Post-op Vital Signs: stable  Complications: Redness of periorbital skin from eye taping during surgery.  Sclera clear. No vision problems. Should resolve with time. Discussed with patient.

## 2011-07-21 NOTE — Interval H&P Note (Signed)
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 After consideration of risks, benefits and other options for treatment, the patient has consented to  Procedure(s) (LRB): PATELLA TENDON REPAIR (Left) as a surgical intervention .  The patients' history has been reviewed, patient examined, no change in status, stable for surgery.  I have reviewed the patients' chart and labs.  Questions were answered to the patient's satisfaction.     Loanne Drilling

## 2011-07-21 NOTE — H&P (Signed)
Subjective:   Patient is a 76 y.o. female presents with patella tendon and extensor mechanism dysfunction. The pain is located left knee. Patient describes the pain as continuous and rated as mild and moderate. Pain has been associated with activity.  Symptoms are aggravated by weight bearing. Symptoms improve with rest. It is felt that the patient would best be served by surgical repair.  There are no active problems to display for this patient.  Past Medical History  Diagnosis Date  . PONV (postoperative nausea and vomiting)   . Arthritis     rheumatoid    Past Surgical History  Procedure Date  . Abdominal hysterectomy 1984    ovarys left in  . Tonsillectomy     as child  . Cholecystectomy 2005  . Joint replacement 1990    right knee  . Wrist bilateral [id number]    for arthritis  . Dilation and curettage of uterus   . Left knee replacement 2012    x2 surgeries-ruptures patella tendon, fractured patella    Prescriptions prior to admission  Medication Sig Dispense Refill  . acetaminophen (TYLENOL) 500 MG tablet Take 1,000 mg by mouth every 6 (six) hours as needed. For headache      . estrogens, conjugated, (PREMARIN) 0.3 MG tablet Take 0.3 mg by mouth daily with breakfast. Take daily for 21 days then do not take for 7 days.      Marland Kitchen etanercept (ENBREL) 50 MG/ML injection Inject 50 mg into the skin once a week. Every week on Tuesday.      . folic acid (FOLVITE) 1 MG tablet Take 1 mg by mouth daily with breakfast.      . methotrexate (RHEUMATREX) 2.5 MG tablet Take 10 mg by mouth See admin instructions. Patient takes 4 tablets by mouth on Friday and Saturday.      Marland Kitchen OVER THE COUNTER MEDICATION Take 1 tablet by mouth 2 (two) times daily. Algae-Cal=750mg  Calcium, 65mg  Magnesium, and 1000 units of Vitamin D      . oxymetazoline (AFRIN) 0.05 % nasal spray Place 2 sprays into the nose as needed. For congestion      . predniSONE (DELTASONE) 5 MG tablet Take 5 mg by mouth daily as needed.  For arthritis flare ups      . rosuvastatin (CRESTOR) 10 MG tablet Take 10 mg by mouth daily with breakfast.      . vitamin C (ASCORBIC ACID) 500 MG tablet Take 500 mg by mouth 2 (two) times daily.      Marland Kitchen VITAMIN D, ERGOCALCIFEROL, PO Take 10,000 Units by mouth once a week. On Wednesdays      . Zinc 50 MG CAPS Take 50 mg by mouth 2 (two) times daily.      . diphenhydrAMINE (BENADRYL) 12.5 MG chewable tablet Chew 12.5 mg by mouth every 4 (four) hours as needed. For allergies       Allergies  Allergen Reactions  . Sulfa Antibiotics     Hives as child-age 69    History  Substance Use Topics  . Smoking status: Former Smoker -- 0.5 packs/day for 15 years    Types: Cigarettes  . Smokeless tobacco: Former Neurosurgeon    Quit date: 07/14/2004  . Alcohol Use: No    History reviewed. No pertinent family history.  Review of Systems Constitutional: negative Respiratory: negative Cardiovascular: negative Gastrointestinal: negative Genitourinary:positive for dysuria but has been treated recently for UTI Musculoskeletal:negative except for knee dysfunction  Objective:   Patient Vitals for the  past 8 hrs:  BP Temp Temp src Pulse Resp SpO2  07/21/11 1019 152/86 mmHg 97.5 F (36.4 C) Oral 83  18  95 %          BP 152/86  Pulse 83  Temp 97.5 F (36.4 C) (Oral)  Resp 18  SpO2 95%  General Appearance:    Alert, cooperative, no distress, appears stated age  Head:    Normocephalic, without obvious abnormality, atraumatic  Eyes:    PER,  EOM's intact, , both eyes           Neck:   Supple no carotid   bruit or JVD     Lungs:     Clear to auscultation bilaterally, respirations unlabored      Heart:    Regular rate and rhythm, S1 and S2 normal, no murmur, rub   or gallop  Breast Exam:    No tenderness, masses, or nipple abnormality  Abdomen:     Soft, non-tender, bowel sounds active all four quadrants,    Round, soft protuberant  Genitalia:  Not done  Rectal:   not done  Extremities:    Extremities normal, atraumatic, no cyanosis or edema 40 degree extensor lag, no laxity, flex to 100 degrees.  Pulses:   2+ and symmetric all extremities  Skin:   Skin color, texture, turgor normal, no rashes or lesions           Assessment:  Left patella tendon and extensor mechanism dusfunction Active Problems:  * No active hospital problems. *    Plan:   Admit for surgical repair of the patella tendon and extensor mechanism dysfunction. Surgery to be performed by Dr. Lequita Halt.  Avel Peace, PA-C

## 2011-07-21 NOTE — Progress Notes (Signed)
Pt has reddened skin on eyelids and states they are painful; Dr. Council Mechanic, anesthesiologist, made aware

## 2011-07-22 ENCOUNTER — Encounter (HOSPITAL_COMMUNITY): Payer: Self-pay | Admitting: Orthopedic Surgery

## 2011-07-22 LAB — CBC
HCT: 35.2 % — ABNORMAL LOW (ref 36.0–46.0)
Hemoglobin: 11.3 g/dL — ABNORMAL LOW (ref 12.0–15.0)
MCH: 32.2 pg (ref 26.0–34.0)
MCHC: 32.1 g/dL (ref 30.0–36.0)
MCV: 100.3 fL — ABNORMAL HIGH (ref 78.0–100.0)

## 2011-07-22 LAB — BASIC METABOLIC PANEL
BUN: 6 mg/dL (ref 6–23)
Chloride: 104 mEq/L (ref 96–112)
Creatinine, Ser: 0.37 mg/dL — ABNORMAL LOW (ref 0.50–1.10)
Glucose, Bld: 192 mg/dL — ABNORMAL HIGH (ref 70–99)
Potassium: 4.1 mEq/L (ref 3.5–5.1)

## 2011-07-22 NOTE — Progress Notes (Signed)
OT Note:  Checked with pt.  She has had multiple knee surgeries, has assist and all DME.  She does not feel she needs OT at this time.  Will sign off; screen only.  [place], OTR/L [phone] 07/22/2011

## 2011-07-22 NOTE — Progress Notes (Signed)
Physical Therapy Treatment Patient Details Name: [name] MRN: [id number] DOB: [birth year] Today's Date: 07/22/2011 Time: 1478-2956 PT Time Calculation (min): 18 min  PT Assessment / Plan / Recommendation Comments on Treatment Session       Follow Up Recommendations  Home health PT    Barriers to Discharge        Equipment Recommendations  None recommended by PT    Recommendations for Other Services OT consult  Frequency 7X/week   Plan Discharge plan remains appropriate    Precautions / Restrictions Precautions Precautions: Other (comment);Fall Precaution Comments: NO ROM AT LEFT KNEE - KI AT ALL TIMES Required Braces or Orthoses: Knee Immobilizer - Left Knee Immobilizer - Left: On at all times Restrictions Weight Bearing Restrictions: No Other Position/Activity Restrictions: WBAT   Pertinent Vitals/Pain 6/10; pain meds requested    Mobility  Bed Mobility Bed Mobility: Sit to Supine Sit to Supine: 4: Min assist Details for Bed Mobility Assistance: cues for sequence and use of R LE to self assist, min assist for L LE Transfers Transfers: Sit to Stand;Stand to Sit Sit to Stand: 4: Min assist Stand to Sit: 4: Min assist Details for Transfer Assistance: cues for use of UEs and for LE management Ambulation/Gait Ambulation/Gait Assistance: 4: Min assist Ambulation Distance (Feet): 25 Feet (x2) Assistive device: Rolling walker Ambulation/Gait Assistance Details: cues for position from RW Gait Pattern: Decreased step length - left;Decreased step length - right;Step-through pattern    Exercises     PT Diagnosis:    PT Problem List:   PT Treatment Interventions:     PT Goals Acute Rehab PT Goals PT Goal Formulation: With patient Time For Goal Achievement: 07/26/11 Potential to Achieve Goals: Good Pt will go Supine/Side to Sit: with supervision PT Goal: Supine/Side to Sit - Progress: Progressing toward goal Pt will go Sit to Supine/Side: with  supervision PT Goal: Sit to Supine/Side - Progress: Progressing toward goal Pt will go Sit to Stand: with supervision PT Goal: Sit to Stand - Progress: Progressing toward goal Pt will go Stand to Sit: with supervision PT Goal: Stand to Sit - Progress: Progressing toward goal Pt will Ambulate: 51 - 150 feet;with supervision;with rolling walker PT Goal: Ambulate - Progress: Progressing toward goal  Visit Information  Last PT Received On: 07/22/11 Assistance Needed: +1    Subjective Data  Subjective: I need to use the bathroom and lie down Patient Stated Goal: Resume previous lifestyle "but I am not going to push it"   Cognition  Overall Cognitive Status: Appears within functional limits for tasks assessed/performed Arousal/Alertness: Awake/alert Orientation Level: Appears intact for tasks assessed Behavior During Session: [facility] for tasks performed    Balance     End of Session PT - End of Session Equipment Utilized During Treatment: Left knee immobilizer Activity Tolerance: Patient tolerated treatment well Patient left: in bed;with call bell/phone within reach;with family/visitor present Nurse Communication: Mobility status    [name] 07/22/2011, 1:07 PM

## 2011-07-22 NOTE — Anesthesia Postprocedure Evaluation (Signed)
  Anesthesia Post-op Note  Patient: [name]  Procedure(s) Performed: Procedure(s) (LRB): PATELLA TENDON REPAIR (Left)  Patient Location: PACU  Anesthesia Type: General  Level of Consciousness: awake and alert   Airway and Oxygen Therapy: Patient Spontanous Breathing  Post-op Pain: mild  Post-op Assessment: Post-op Vital signs reviewed, Patient's Cardiovascular Status Stable, Respiratory Function Stable, Patent Airway and No signs of Nausea or vomiting  Post-op Vital Signs: stable  Complications: No apparent anesthesia complications

## 2011-07-22 NOTE — Progress Notes (Signed)
Subjective: 1 Day Post-Op Procedure(s) (LRB): PATELLA TENDON REPAIR (Left) Patient reports pain as mild.   Patient seen in rounds with Dr. Lequita Halt. Briefly discussed surgical findings. Son in room with patient. Patient is well, and has had no acute complaints or problems We will start therapy today.  Plan is to go Home after hospital stay.  Objective: Vital signs in last 24 hours: Temp:  [97 F (36.1 C)-98.2 F (36.8 C)] 98.1 F (36.7 C) (06/14 0400) Pulse Rate:  [68-83] 69  (06/14 0400) Resp:  [10-18] 16  (06/14 0400) BP: (122-166)/(67-96) 125/78 mmHg (06/14 0400) SpO2:  [95 %-100 %] 96 % (06/14 0400) Weight:  [90.719 kg (200 lb)] 90.719 kg (200 lb) (06/13 1646)  Intake/Output from previous day:  Intake/Output Summary (Last 24 hours) at 07/22/11 0724 Last data filed at 07/22/11 0101  Gross per 24 hour  Intake 2011.25 ml  Output   1300 ml  Net 711.25 ml    Intake/Output this shift:    Labs:  Basename 07/22/11 0345  HGB 11.3*    Basename 07/22/11 0345  WBC 7.2  RBC 3.51*  HCT 35.2*  PLT 267    Basename 07/22/11 0345  NA 137  K 4.1  CL 104  CO2 25  BUN 6  CREATININE 0.37*  GLUCOSE 192*  CALCIUM 8.7   No results found for this basename: LABPT:2,INR:2 in the last 72 hours  EXAM General - Patient is Alert, Appropriate and Oriented Extremity - Neurovascular intact Sensation intact distally Dressing - dressing C/D/I Motor Function - intact, moving foot and toes well on exam.   Past Medical History  Diagnosis Date  . PONV (postoperative nausea and vomiting)   . Arthritis     rheumatoid    Assessment/Plan: 1 Day Post-Op Procedure(s) (LRB): PATELLA TENDON REPAIR (Left) Principal Problem:  *Patellar tendon rupture   Advance diet Up with therapy Discharge home with home health  DVT Prophylaxis - Lovenox Weight-Bearing as tolerated to left leg -  NO BENDING, NO MOTION, NO BENDING!!! No vaccines. DC O2 Probably home over the weekend depending  upon progress Will keep in Knee Immobilizer At All Times until she follows up in the office.  Rebecka Oelkers 07/22/2011, 7:24 AM

## 2011-07-22 NOTE — Progress Notes (Signed)
Physical Therapy Treatment Patient Details Name: [name] MRN: [id number] DOB: April 19, 1935 Today's Date: 07/22/2011 Time: 9562-1308 PT Time Calculation (min): 28 min  PT Assessment / Plan / Recommendation Comments on Treatment Session       Follow Up Recommendations  Home health PT    Barriers to Discharge        Equipment Recommendations  None recommended by PT    Recommendations for Other Services OT consult  Frequency 7X/week   Plan Discharge plan remains appropriate    Precautions / Restrictions Precautions Precaution Comments: NO ROM AT LEFT KNEE - KI AT ALL TIMES Required Braces or Orthoses: Knee Immobilizer - Left Knee Immobilizer - Left: On at all times Restrictions Weight Bearing Restrictions: No Other Position/Activity Restrictions: WBAT   Pertinent Vitals/Pain     Mobility  Bed Mobility Bed Mobility: Supine to Sit;Sit to Supine Supine to Sit: 4: Min assist Sit to Supine: 4: Min assist Details for Bed Mobility Assistance: cues for sequence and use of R LE to self assist, min assist for L LE Transfers Transfers: Sit to Stand;Stand to Sit Sit to Stand: 4: Min assist Stand to Sit: 4: Min assist Details for Transfer Assistance: cues for use of UEs and for LE management Ambulation/Gait Ambulation/Gait Assistance: 4: Min assist Ambulation Distance (Feet): 111 Feet (111 and 25') Assistive device: Rolling walker Ambulation/Gait Assistance Details: min cues for position from RW Gait Pattern: Decreased step length - left;Decreased step length - right;Step-through pattern    Exercises     PT Diagnosis:    PT Problem List:   PT Treatment Interventions:     PT Goals Acute Rehab PT Goals PT Goal Formulation: With patient Time For Goal Achievement: 07/26/11 Potential to Achieve Goals: Good Pt will go Supine/Side to Sit: with supervision PT Goal: Supine/Side to Sit - Progress: Progressing toward goal Pt will go Sit to Supine/Side: with supervision PT  Goal: Sit to Supine/Side - Progress: Progressing toward goal Pt will go Sit to Stand: with supervision PT Goal: Sit to Stand - Progress: Progressing toward goal Pt will go Stand to Sit: with supervision PT Goal: Stand to Sit - Progress: Progressing toward goal Pt will Ambulate: 51 - 150 feet;with supervision;with rolling walker PT Goal: Ambulate - Progress: Progressing toward goal  Visit Information  Last PT Received On: 07/22/11 Assistance Needed: +1    Subjective Data  Subjective: I don't need to do stairs, I know how Patient Stated Goal: Resume previous lifestyle "but I am not going to push it"   Cognition  Overall Cognitive Status: Appears within functional limits for tasks assessed/performed Arousal/Alertness: Awake/alert Orientation Level: Appears intact for tasks assessed Behavior During Session: [facility] for tasks performed    Balance     End of Session PT - End of Session Equipment Utilized During Treatment: Left knee immobilizer Activity Tolerance: Patient tolerated treatment well Patient left: in bed;with call bell/phone within reach;with family/visitor present Nurse Communication: Mobility status    [name] 07/22/2011, 4:55 PM

## 2011-07-22 NOTE — Op Note (Signed)
NAMEDEVANNY, PALECEK              ACCOUNT NO.:  [id number]  MEDICAL RECORD NO.:  [id number]  LOCATION:  1601                         FACILITY:  [facility]  PHYSICIAN:  Ollen Gross, M.D.    DATE OF BIRTH:  1935-04-11  DATE OF PROCEDURE: DATE OF DISCHARGE:                              OPERATIVE REPORT   PREOPERATIVE DIAGNOSIS:  Extensor mechanism disruption, left knee.  POSTOPERATIVE DIAGNOSIS:  Extensor mechanism disruption, left knee.  PROCEDURE:  Patellar tendon/extensor mechanism reconstruction, left knee.  SURGEON:  Ollen Gross, M.D.  ASSISTANT:  Alexzandrew L. Perkins, P.A.C.  ANESTHESIA:  General.  ESTIMATED BLOOD LOSS:  Minimal.  DRAINS:  None.  COMPLICATIONS:  None.  CONDITION:  Stable to recovery.  TOURNIQUET TIME:  70 minutes at 300 mmHg.  BRIEF CLINICAL NOTE:  Ms. Bartles is a 76 year old female with a long complex history in regards to her knee.  She had a total knee arthroplasty and then a patellar tendon/patellar fracture postoperatively and attempted repair on 2 to 3 other occasions. Unfortunately, she has left with extensor disruption and dysfunction. She presents now for reconstruction.  PROCEDURE IN DETAIL:  After successful administration of general anesthetic, a tourniquet was placed high on the left thigh and left lower extremity, prepped and draped in the usual sterile fashion. Extremities wrapped in an Esmarch, knee flexed, tourniquet inflated to 300 mmHg.  A midline incision was utilized.  Skin cut with a 10 blade through subcutaneous tissue to the extensor mechanism.  The patella is in a position far too superior on the femur.  The tissue between the tibial tubercle and the inferior pole of the patella is attenuated and very flimsy.  This was then incised and there is some fluid in the joint which appears clear.  Joint was thoroughly irrigated with saline.  I mobilized the tissue under the patella and mobilized any adhesions between the  clot and the femur.  I was able to get to mobilize the patella back to normal position.  We had discussed preoperatively the use of an allograft to reconstruct the patellar tendon.  An Achilles allograft was thawed and I configured a bone plug so that it would fit into a trough I created in the tibial tubercle.  The trough was created with small osteotomes.  We then laid the bone down into the trough and I used two 4.0 cancellous screws with a washer to secure it.  I tugged down the graft and it was found to be very stable.  We then pulled the patella back to its normal position and I placed tension on the graft and marked the spot where the inferior pole of the patella would meet the graft.  We then placed two #2 Ethibond sutures and weaved them through the tendon starting distally and going proximally with the Bunnell-type weave.  I created 3 holes in the patella from inferior to superior and then passed the sutures through those holes, tying them down to the patella and then tying them to each other.  This created excellent tension on the graft and created a very stable repair.  We then sewed the remaining tendon graft over top of the patella and weaved two #2  Ethibond sutures through the tendon and then weaved them down starting at the quad tendon and then along the edges of the patella and then pulled down further to keep the patella in the reduced position and tied these sutures to each other.  The patient's native tissue that was at the previous position of the tendon was then mobilized and sewn to each other and to the tendon as well as mobilized proximally to again placed more of a force keeping the patella down in its reduced position.  The remainder of the tendon graft was then folded over back distally and the edges of this was sewn down to the tissue to reinforce the repair.  I was very pleased with the repair.  The wound was then copiously irrigated with saline solution and  the tourniquet released, total time 70 minutes.  Subcu was then closed with interrupted 2-0 Vicryl and subcuticular running 4-0 Monocryl.  The incision was then cleaned and dried and Steri-Strips and a bulky sterile dressing applied. She was placed into a knee immobilizer, awakened, and transported to recovery in stable condition.  She is not to do any range of motion on this knee for the next 8 weeks.     Ollen Gross, M.D.     FA/MEDQ  D:  07/21/2011  T:  07/22/2011  Job:  [id number]

## 2011-07-22 NOTE — Evaluation (Signed)
Physical Therapy Evaluation Patient Details Name: [name] MRN: [id number] DOB: [birth year] Today's Date: 07/22/2011 Time: 0812-0855 PT Time Calculation (min): 43 min  PT Assessment / Plan / Recommendation Clinical Impression  Pt wtih L Patellar tendon reconstruction presnets with L LE in KI and with decreased strength causing significant limitations in functional mobility    PT Assessment  Patient needs continued PT services    Follow Up Recommendations  Home health PT    Barriers to Discharge        lEquipment Recommendations  None recommended by PT    Recommendations for Other Services OT consult   Frequency 7X/week    Precautions / Restrictions Precautions Precautions: Other (comment);Fall Precaution Comments: NO ROM AT LEFT KNEE - KI AT ALL TIMES Required Braces or Orthoses: Knee Immobilizer - Left Knee Immobilizer - Left: On at all times Restrictions Weight Bearing Restrictions: No Other Position/Activity Restrictions: WBAT   Pertinent Vitals/Pain 5/10 with activity; pt premedicated and provided with cold packs      Mobility  Bed Mobility Bed Mobility: Supine to Sit Supine to Sit: 4: Min assist Details for Bed Mobility Assistance: cues for sequence, min assist for L LE Transfers Transfers: Sit to Stand;Stand to Sit Sit to Stand: 4: Min assist Stand to Sit: 4: Min assist Details for Transfer Assistance: cues for use of UEs and for LE management Ambulation/Gait Ambulation/Gait Assistance: 4: Min assist Ambulation Distance (Feet): 75 Feet (75' and 25') Assistive device: Rolling walker Ambulation/Gait Assistance Details: cues for posture, position from RW and sequence Gait Pattern: Decreased step length - left;Decreased step length - right;Step-through pattern    Exercises     PT Diagnosis: Difficulty walking  PT Problem List: Decreased strength;Decreased range of motion;Decreased activity tolerance;Decreased balance;Decreased  mobility;Obesity;Pain;Decreased knowledge of use of DME PT Treatment Interventions: DME instruction;Gait training;Stair training;Functional mobility training;Therapeutic activities;Therapeutic exercise;Patient/family education   PT Goals Acute Rehab PT Goals PT Goal Formulation: With patient Time For Goal Achievement: 07/26/11 Potential to Achieve Goals: Good Pt will go Supine/Side to Sit: with supervision PT Goal: Supine/Side to Sit - Progress: Goal set today Pt will go Sit to Supine/Side: with supervision PT Goal: Sit to Supine/Side - Progress: Goal set today Pt will go Sit to Stand: with supervision PT Goal: Sit to Stand - Progress: Goal set today Pt will go Stand to Sit: with supervision PT Goal: Stand to Sit - Progress: Goal set today Pt will Ambulate: 51 - 150 feet;with supervision;with rolling walker PT Goal: Ambulate - Progress: Goal set today Pt will Go Up / Down Stairs: 1-2 stairs;with min assist;with least restrictive assistive device PT Goal: Up/Down Stairs - Progress: Goal set today  Visit Information  Last PT Received On: 07/22/11 Assistance Needed: +2    Subjective Data  Subjective: This is my fifth surgery on this knee and I am not to bend it at all Patient Stated Goal: Resume previous lifestyle "but I am not going to push it"   Prior Functioning  Home Living Lives With: Alone Available Help at Discharge: Friend(s);Family Type of Home: House Home Access: Stairs to enter Secretary/administrator of Steps: 2 Entrance Stairs-Rails: Can reach both Home Layout: One level Home Adaptive Equipment: Walker - rolling Prior Function Level of Independence: Independent with assistive device(s) Able to Take Stairs?: Yes Driving: Yes Communication Communication: No difficulties    Cognition  Overall Cognitive Status: Appears within functional limits for tasks assessed/performed Arousal/Alertness: Awake/alert Orientation Level: Appears intact for tasks  assessed Behavior During Session: [facility]  for tasks performed    Extremity/Trunk Assessment Right Upper Extremity Assessment RUE ROM/Strength/Tone: Within functional levels Left Upper Extremity Assessment LUE ROM/Strength/Tone: WFL for tasks assessed Right Lower Extremity Assessment RLE ROM/Strength/Tone: Deficits RLE ROM/Strength/Tone Deficits: KI on at all times - NO ROM at knee Left Lower Extremity Assessment LLE ROM/Strength/Tone: [facility] for tasks assessed   Balance    End of Session PT - End of Session Equipment Utilized During Treatment: Left knee immobilizer Activity Tolerance: Patient tolerated treatment well Patient left: in chair;with call bell/phone within reach;with family/visitor present Nurse Communication: Mobility status   Rishav Rockefeller 07/22/2011, 9:48 AM

## 2011-07-23 LAB — CBC
MCH: 33.6 pg (ref 26.0–34.0)
MCHC: 33.2 g/dL (ref 30.0–36.0)
MCV: 101.1 fL — ABNORMAL HIGH (ref 78.0–100.0)
Platelets: 327 10*3/uL (ref 150–400)
RBC: 3.48 MIL/uL — ABNORMAL LOW (ref 3.87–5.11)
RDW: 14.2 % (ref 11.5–15.5)

## 2011-07-23 MED ORDER — METHOCARBAMOL 500 MG PO TABS: 500.0000 mg | ORAL_TABLET | Freq: Four times a day (QID) | ORAL | Status: AC | PRN

## 2011-07-23 MED ORDER — ENOXAPARIN SODIUM 40 MG/0.4ML ~~LOC~~ SOLN: 40.0000 mg | SUBCUTANEOUS | Status: DC

## 2011-07-23 MED ORDER — OXYCODONE HCL 5 MG PO TABS: 5.0000 mg | ORAL_TABLET | ORAL | Status: AC | PRN

## 2011-07-23 NOTE — Progress Notes (Signed)
Subjective: Doing well wants to go home   Objective: Vital signs in last 24 hours: Temp:  [97.5 F (36.4 C)-98 F (36.7 C)] 97.5 F (36.4 C) (06/15 0620) Pulse Rate:  [66-76] 67  (06/15 0620) Resp:  [16] 16  (06/15 0620) BP: (125-151)/(76-84) 151/84 mmHg (06/15 0620) SpO2:  [94 %-98 %] 96 % (06/15 0620)  Intake/Output from previous day: 06/14 0701 - 06/15 0700 In: 1920 [P.O.:1920] Out: 1650 [Urine:1650] Intake/Output this shift: Total I/O In: 360 [P.O.:360] Out: 1650 [Urine:1650]   Basename 07/23/11 0535 07/22/11 0345  HGB 11.7* 11.3*    Basename 07/23/11 0535 07/22/11 0345  WBC PENDING 7.2  RBC 3.48* 3.51*  HCT 35.2* 35.2*  PLT 327 267    Basename 07/22/11 0345  NA 137  K 4.1  CL 104  CO2 25  BUN 6  CREATININE 0.37*  GLUCOSE 192*  CALCIUM 8.7   No results found for this basename: LABPT:2,INR:2 in the last 72 hours  Cons alert comfortable, dressing removed wound clean dry no erythema or d/c, calf soft NT  Assessment/Plan: POD#2 S/P Extensor mech allograft repair left TKA doing well Amb 100 ft yest, understands instructions, will d/c on lovenox till F/U in two weeks.  Jamelle Rushing 07/23/2011, 6:34 AM

## 2011-07-23 NOTE — Progress Notes (Signed)
Discharged from floor via w/c, sister with pt. No changes in assessment. [name]   

## 2011-07-23 NOTE — Progress Notes (Signed)
Physical Therapy Treatment Patient Details Name: [name] MRN: [id number] DOB: 04/06/1935 Today's Date: 07/23/2011 Time: 7829-5621 PT Time Calculation (min): 20 min  PT Assessment / Plan / Recommendation Comments on Treatment Session  Pt is eager to D/C to home. Very knowledgable havinh had multiple knee surgeries.    Follow Up Recommendations  Home health PT    Barriers to Discharge        Equipment Recommendations  None recommended by PT    Recommendations for Other Services    Frequency     Plan Discharge plan remains appropriate    Precautions / Restrictions Precautions Precautions: Fall Precaution Comments: NO ROM AT LEFT KNEE - KI AT ALL TIMES Required Braces or Orthoses: Knee Immobilizer - Left Knee Immobilizer - Left: On at all times Restrictions Weight Bearing Restrictions: No Other Position/Activity Restrictions: WBAT   Pertinent Vitals/Pain C/o "soreness"    Mobility  Bed Mobility Bed Mobility: Supine to Sit Supine to Sit: 4: Min guard Details for Bed Mobility Assistance: increased time to maneuver L LE off bed Transfers Transfers: Sit to Stand;Stand to Sit Sit to Stand: 5: Supervision;From bed;From toilet Stand to Sit: 5: Supervision;To toilet;To chair/3-in-1 Details for Transfer Assistance: increased time and one VC on hand palcement with stand to sit Ambulation/Gait Ambulation/Gait Assistance: 4: Min guard Ambulation Distance (Feet): 115 Feet Assistive device: Rolling walker Ambulation/Gait Assistance Details: 25% VC's for safety with turns and side stepping into Bathroom Gait Pattern: Step-to pattern;Decreased stride length Gait velocity: decreased Stairs: No (Pt declined one step practice) Wheelchair Mobility Wheelchair Mobility: No     PT Goals    Visit Information  Last PT Received On: 07/23/11 Assistance Needed: +1                   End of Session PT - End of Session Equipment Utilized During Treatment: Gait  belt Activity Tolerance: Patient tolerated treatment well Patient left: in chair;with call bell/phone within reach;with family/visitor present Nurse Communication: Other (comment) (Pt ready for D/C to home)    Felecia Shelling  PTA [facility]  Acute  Rehab Pager     [phone]

## 2011-07-23 NOTE — Progress Notes (Signed)
CM spoke with pt concerning dc planning. Per pt choice [facility] to provide [facility] services upon dc. Pt has access to Dme. No further HH or DME needs stated. Pt's sister present at bedside to assist in home care. Genevieve Norlander rep notified of discharge.   Leonie Green [phone]

## 2011-07-24 DIAGNOSIS — M069 Rheumatoid arthritis, unspecified: Secondary | ICD-10-CM | POA: Diagnosis not present

## 2011-07-24 DIAGNOSIS — IMO0001 Reserved for inherently not codable concepts without codable children: Secondary | ICD-10-CM | POA: Diagnosis not present

## 2011-07-24 DIAGNOSIS — Z4889 Encounter for other specified surgical aftercare: Secondary | ICD-10-CM | POA: Diagnosis not present

## 2011-08-01 DIAGNOSIS — M069 Rheumatoid arthritis, unspecified: Secondary | ICD-10-CM | POA: Diagnosis not present

## 2011-08-01 DIAGNOSIS — IMO0001 Reserved for inherently not codable concepts without codable children: Secondary | ICD-10-CM | POA: Diagnosis not present

## 2011-08-01 DIAGNOSIS — Z4889 Encounter for other specified surgical aftercare: Secondary | ICD-10-CM | POA: Diagnosis not present

## 2011-08-08 DIAGNOSIS — IMO0001 Reserved for inherently not codable concepts without codable children: Secondary | ICD-10-CM | POA: Diagnosis not present

## 2011-08-08 DIAGNOSIS — M069 Rheumatoid arthritis, unspecified: Secondary | ICD-10-CM | POA: Diagnosis not present

## 2011-08-08 DIAGNOSIS — Z4889 Encounter for other specified surgical aftercare: Secondary | ICD-10-CM | POA: Diagnosis not present

## 2011-08-15 NOTE — Discharge Summary (Signed)
Physician Discharge Summary   Patient ID: [name] MRN: [id number] DOB/AGE: May 06, 1935 76 y.o.  Admit date: 07/21/2011 Discharge date: 07/23/2011  Primary Diagnosis: Left patella tendon and extensor mechanism dusfunction   Admission Diagnoses:  Past Medical History  Diagnosis Date  . PONV (postoperative nausea and vomiting)   . Arthritis     rheumatoid   Discharge Diagnoses:   Principal Problem:  *Patellar tendon rupture  Procedure:  Procedure(s) (LRB): PATELLA TENDON REPAIR (Left)   Consults: None  HPI: Ms. Campa is a 76 year old female with a long complex history in regards to her knee. She had a total knee arthroplasty and then a patellar tendon/patellar fracture postoperatively and attempted repair on 2 to 3 other occasions. Unfortunately, she has left with extensor disruption and dysfunction. She presents now for reconstruction.   Laboratory Data: Hospital Outpatient Visit on 07/15/2011  Component Date Value Range Status  . MRSA, PCR 07/15/2011 NEGATIVE  NEGATIVE Final  . Staphylococcus aureus 07/15/2011 NEGATIVE  NEGATIVE Final   Comment:                                 The Xpert SA Assay (FDA                          approved for NASAL specimens                          only), is one component of                          a comprehensive surveillance                          program.  It is not intended                          to diagnose infection nor to                          guide or monitor treatment.  Marland Kitchen aPTT 07/15/2011 28  24 - 37 seconds Final  . WBC 07/15/2011 12.1* 4.0 - 10.5 K/uL Final  . RBC 07/15/2011 4.08  3.87 - 5.11 MIL/uL Final  . Hemoglobin 07/15/2011 13.2  12.0 - 15.0 g/dL Final  . HCT 30/86/5784 40.9  36.0 - 46.0 % Final  . MCV 07/15/2011 100.2* 78.0 - 100.0 fL Final  . MCH 07/15/2011 32.4  26.0 - 34.0 pg Final  . MCHC 07/15/2011 32.3  30.0 - 36.0 g/dL Final  . RDW 69/62/9528 14.5  11.5 - 15.5 % Final  . Platelets 07/15/2011  237  150 - 400 K/uL Final  . Sodium 07/15/2011 136  135 - 145 mEq/L Final  . Potassium 07/15/2011 4.0  3.5 - 5.1 mEq/L Final  . Chloride 07/15/2011 100  96 - 112 mEq/L Final  . CO2 07/15/2011 26  19 - 32 mEq/L Final  . Glucose, Bld 07/15/2011 97  70 - 99 mg/dL Final  . BUN 41/32/4401 14  6 - 23 mg/dL Final  . Creatinine, Ser 07/15/2011 0.42* 0.50 - 1.10 mg/dL Final  . Calcium 02/72/5366 9.7  8.4 - 10.5 mg/dL Final  . Total Protein 07/15/2011 7.0  6.0 - 8.3 g/dL Final  .  Albumin 07/15/2011 3.5  3.5 - 5.2 g/dL Final  . AST 57/84/6962 10  0 - 37 U/L Final  . ALT 07/15/2011 10  0 - 35 U/L Final  . Alkaline Phosphatase 07/15/2011 55  39 - 117 U/L Final  . Total Bilirubin 07/15/2011 0.4  0.3 - 1.2 mg/dL Final  . GFR calc non Af Amer 07/15/2011 >90  >90 mL/min Final  . GFR calc Af Amer 07/15/2011 >90  >90 mL/min Final   Comment:                                 The eGFR has been calculated                          using the CKD EPI equation.                          This calculation has not been                          validated in all clinical                          situations.                          eGFR's persistently                          <90 mL/min signify                          possible Chronic Kidney Disease.  Marland Kitchen Prothrombin Time 07/15/2011 12.8  11.6 - 15.2 seconds Final  . INR 07/15/2011 0.94  0.00 - 1.49 Final  . Color, Urine 07/15/2011 YELLOW  YELLOW Final  . APPearance 07/15/2011 CLEAR  CLEAR Final  . Specific Gravity, Urine 07/15/2011 1.012  1.005 - 1.030 Final  . pH 07/15/2011 6.0  5.0 - 8.0 Final  . Glucose, UA 07/15/2011 NEGATIVE  NEGATIVE mg/dL Final  . Hgb urine dipstick 07/15/2011 NEGATIVE  NEGATIVE Final  . Bilirubin Urine 07/15/2011 NEGATIVE  NEGATIVE Final  . Ketones, ur 07/15/2011 NEGATIVE  NEGATIVE mg/dL Final  . Protein, ur 95/28/4132 NEGATIVE  NEGATIVE mg/dL Final  . Urobilinogen, UA 07/15/2011 0.2  0.0 - 1.0 mg/dL Final  . Nitrite 44/02/270 NEGATIVE   NEGATIVE Final  . Leukocytes, UA 07/15/2011 NEGATIVE  NEGATIVE Final   MICROSCOPIC NOT DONE ON URINES WITH NEGATIVE PROTEIN, BLOOD, LEUKOCYTES, NITRITE, OR GLUCOSE <1000 mg/dL.   No results found for this basename: HGB:5 in the last 72 hours No results found for this basename: WBC:2,RBC:2,HCT:2,PLT:2 in the last 72 hours No results found for this basename: NA:2,K:2,CL:2,CO2:2,BUN:2,CREATININE:2,GLUCOSE:2,CALCIUM:2 in the last 72 hours No results found for this basename: LABPT:2,INR:2 in the last 72 hours  X-Rays:No results found.  EKG:No orders found for this or any previous visit.   Hospital Course: Patient was admitted to [facility] and taken to the OR and underwent the above state procedure without complications.  Patient tolerated the procedure well and was later transferred to the recovery room and then to the orthopaedic floor for postoperative care.  They were given PO and IV analgesics for pain control following their  surgery.  They were given 24 hours of postoperative antibiotics and started on DVT prophylaxis in the form of Lovenox.   PT and OT were ordered for mobility.  Discharge planning consulted to help with postop disposition and equipment needs.  Patient had a decent night on the evening of surgery and started to get up OOB with therapy on day one. Weight-Bearing as tolerated to left leg - NO BENDING, NO MOTION, NO BENDING!!!  Continued to work with therapy into day two.The patient had progressed with therapy and meeting their goals. Patient was seen in rounds and was ready to go home.  Discharge Medications: Prior to Admission medications   Medication Sig Start Date End Date Taking? Authorizing Provider  acetaminophen (TYLENOL) 500 MG tablet Take 1,000 mg by mouth every 6 (six) hours as needed. For headache   Yes Historical Provider, MD  estrogens, conjugated, (PREMARIN) 0.3 MG tablet Take 0.3 mg by mouth daily with breakfast. Take daily for 21 days then do not take for  7 days.   Yes Historical Provider, MD  etanercept (ENBREL) 50 MG/ML injection Inject 50 mg into the skin once a week. Every week on Tuesday.   Yes Historical Provider, MD  folic acid (FOLVITE) 1 MG tablet Take 1 mg by mouth daily with breakfast.   Yes Historical Provider, MD  methotrexate (RHEUMATREX) 2.5 MG tablet Take 10 mg by mouth See admin instructions. Patient takes 4 tablets by mouth on Friday and Saturday.   Yes Historical Provider, MD  OVER THE COUNTER MEDICATION Take 1 tablet by mouth 2 (two) times daily. Algae-Cal=750mg  Calcium, 65mg  Magnesium, and 1000 units of Vitamin D   Yes Historical Provider, MD  oxymetazoline (AFRIN) 0.05 % nasal spray Place 2 sprays into the nose as needed. For congestion   Yes Historical Provider, MD  predniSONE (DELTASONE) 5 MG tablet Take 5 mg by mouth daily as needed. For arthritis flare ups   Yes Historical Provider, MD  rosuvastatin (CRESTOR) 10 MG tablet Take 10 mg by mouth daily with breakfast.   Yes Historical Provider, MD  vitamin C (ASCORBIC ACID) 500 MG tablet Take 500 mg by mouth 2 (two) times daily.   Yes Historical Provider, MD  VITAMIN D, ERGOCALCIFEROL, PO Take 10,000 Units by mouth once a week. On Wednesdays   Yes Historical Provider, MD  Zinc 50 MG CAPS Take 50 mg by mouth 2 (two) times daily.   Yes Historical Provider, MD  diphenhydrAMINE (BENADRYL) 12.5 MG chewable tablet Chew 12.5 mg by mouth every 4 (four) hours as needed. For allergies    Historical Provider, MD    Diet: Regular diet Activity: Weight-Bearing as tolerated to left leg - NO BENDING, NO MOTION, NO BENDING!!!  Follow-up:in 2 weeks Disposition - Home Discharged Condition: good   Discharge Orders    Future Orders Please Complete By Expires   Diet general      Call MD / Call 911      Comments:   If you experience chest pain or shortness of breath, CALL 911 and be transported to the hospital emergency room.  If you develope a fever above 101 F, pus (white drainage) or  increased drainage or redness at the wound, or calf pain, call your surgeon's office.   Increase activity slowly as tolerated      Discharge instructions      Comments:   Call for a follow up appointment with Dr Lequita Halt in 2 wks. Call [phone]. Do Not Bend Knee, Keep in knee  immobilizer at all times, change dressing daily. Take one 325 Aspirin a day.     Medication List  As of 08/15/2011 10:20 AM   TAKE these medications         acetaminophen 500 MG tablet   Commonly known as: TYLENOL   Take 1,000 mg by mouth every 6 (six) hours as needed. For headache      diphenhydrAMINE 12.5 MG chewable tablet   Commonly known as: BENADRYL   Chew 12.5 mg by mouth every 4 (four) hours as needed. For allergies      estrogens (conjugated) 0.3 MG tablet   Commonly known as: PREMARIN   Take 0.3 mg by mouth daily with breakfast. Take daily for 21 days then do not take for 7 days.      etanercept 50 MG/ML injection   Commonly known as: ENBREL   Inject 50 mg into the skin once a week. Every week on Tuesday.      folic acid 1 MG tablet   Commonly known as: FOLVITE   Take 1 mg by mouth daily with breakfast.      methotrexate 2.5 MG tablet   Commonly known as: RHEUMATREX   Take 10 mg by mouth See admin instructions. Patient takes 4 tablets by mouth on Friday and Saturday.      OVER THE COUNTER MEDICATION   Take 1 tablet by mouth 2 (two) times daily. Algae-Cal=750mg  Calcium, 65mg  Magnesium, and 1000 units of Vitamin D      oxymetazoline 0.05 % nasal spray   Commonly known as: AFRIN   Place 2 sprays into the nose as needed. For congestion      predniSONE 5 MG tablet   Commonly known as: DELTASONE   Take 5 mg by mouth daily as needed. For arthritis flare ups      rosuvastatin 10 MG tablet   Commonly known as: CRESTOR   Take 10 mg by mouth daily with breakfast.      vitamin C 500 MG tablet   Commonly known as: ASCORBIC ACID   Take 500 mg by mouth 2 (two) times daily.      VITAMIN D  (ERGOCALCIFEROL) PO   Take 10,000 Units by mouth once a week. On Wednesdays      Zinc 50 MG Caps   Take 50 mg by mouth 2 (two) times daily.             SignedPatrica Duel 08/15/2011, 10:20 AM

## 2011-08-17 DIAGNOSIS — Z4889 Encounter for other specified surgical aftercare: Secondary | ICD-10-CM | POA: Diagnosis not present

## 2011-08-17 DIAGNOSIS — M069 Rheumatoid arthritis, unspecified: Secondary | ICD-10-CM | POA: Diagnosis not present

## 2011-08-17 DIAGNOSIS — IMO0001 Reserved for inherently not codable concepts without codable children: Secondary | ICD-10-CM | POA: Diagnosis not present

## 2012-09-04 DIAGNOSIS — E669 Obesity, unspecified: Secondary | ICD-10-CM | POA: Diagnosis not present

## 2012-09-04 DIAGNOSIS — R011 Cardiac murmur, unspecified: Secondary | ICD-10-CM | POA: Diagnosis not present

## 2012-09-04 DIAGNOSIS — Z124 Encounter for screening for malignant neoplasm of cervix: Secondary | ICD-10-CM | POA: Diagnosis not present

## 2012-09-04 DIAGNOSIS — Z0000 Encounter for general adult medical examination without abnormal findings: Secondary | ICD-10-CM | POA: Diagnosis not present

## 2012-09-04 DIAGNOSIS — R030 Elevated blood-pressure reading, without diagnosis of hypertension: Secondary | ICD-10-CM | POA: Diagnosis not present

## 2012-09-04 DIAGNOSIS — N390 Urinary tract infection, site not specified: Secondary | ICD-10-CM | POA: Diagnosis not present

## 2012-09-04 DIAGNOSIS — E785 Hyperlipidemia, unspecified: Secondary | ICD-10-CM | POA: Diagnosis not present

## 2012-09-04 DIAGNOSIS — M069 Rheumatoid arthritis, unspecified: Secondary | ICD-10-CM | POA: Diagnosis not present

## 2012-09-05 ENCOUNTER — Other Ambulatory Visit (HOSPITAL_COMMUNITY): Payer: Self-pay | Admitting: Internal Medicine

## 2012-09-05 DIAGNOSIS — R011 Cardiac murmur, unspecified: Secondary | ICD-10-CM

## 2012-09-07 ENCOUNTER — Ambulatory Visit (HOSPITAL_COMMUNITY): Payer: BC Managed Care – PPO

## 2012-09-10 NOTE — Progress Notes (Signed)
Need orders in EPIC.  Surgery scheduled for 8/20.  Thank You.  

## 2012-09-12 ENCOUNTER — Other Ambulatory Visit: Payer: Self-pay | Admitting: Dermatology

## 2012-09-12 DIAGNOSIS — C44319 Basal cell carcinoma of skin of other parts of face: Secondary | ICD-10-CM | POA: Diagnosis not present

## 2012-09-13 ENCOUNTER — Encounter (HOSPITAL_COMMUNITY): Payer: Self-pay | Admitting: Pharmacy Technician

## 2012-09-13 ENCOUNTER — Ambulatory Visit (HOSPITAL_COMMUNITY)
Admission: RE | Admit: 2012-09-13 | Discharge: 2012-09-13 | Disposition: A | Discharge status: Home / Self Care | Payer: Medicare Other | Source: Ambulatory Visit | Attending: Internal Medicine | Admitting: Internal Medicine

## 2012-09-13 DIAGNOSIS — I059 Rheumatic mitral valve disease, unspecified: Secondary | ICD-10-CM | POA: Insufficient documentation

## 2012-09-13 DIAGNOSIS — R011 Cardiac murmur, unspecified: Secondary | ICD-10-CM | POA: Diagnosis not present

## 2012-09-13 NOTE — Progress Notes (Signed)
[name]   2D echo completed 09/13/2012.   Cindy Lewellyn Fultz, RDCS  

## 2012-09-15 ENCOUNTER — Other Ambulatory Visit: Payer: Self-pay | Admitting: Orthopedic Surgery

## 2012-09-15 NOTE — Progress Notes (Signed)
Preoperative surgical orders have been place into the Epic hospital system for [name] on 09/15/2012, 9:50 AM  by Patrica Duel for surgery on 09/26/12.  Preop Knee orders including IV Tylenol, and IV Decadron as long as there are no contraindications to the above medications. Avel Peace, PA-C

## 2012-09-18 DIAGNOSIS — T84049A Periprosthetic fracture around unspecified internal prosthetic joint, initial encounter: Secondary | ICD-10-CM | POA: Diagnosis not present

## 2012-09-18 DIAGNOSIS — S838X9A Sprain of other specified parts of unspecified knee, initial encounter: Secondary | ICD-10-CM | POA: Diagnosis not present

## 2012-09-19 ENCOUNTER — Encounter (HOSPITAL_COMMUNITY): Payer: Self-pay

## 2012-09-19 ENCOUNTER — Encounter (HOSPITAL_COMMUNITY)
Admission: RE | Admit: 2012-09-19 | Discharge: 2012-09-19 | Disposition: A | Discharge status: Home / Self Care | Payer: Medicare Other | Source: Ambulatory Visit | Attending: Orthopedic Surgery | Admitting: Orthopedic Surgery

## 2012-09-19 ENCOUNTER — Other Ambulatory Visit: Payer: Self-pay | Admitting: Orthopedic Surgery

## 2012-09-19 DIAGNOSIS — Z01812 Encounter for preprocedural laboratory examination: Secondary | ICD-10-CM | POA: Diagnosis not present

## 2012-09-19 LAB — CBC
HCT: 39.8 % (ref 36.0–46.0)
MCH: 33.4 pg (ref 26.0–34.0)
MCV: 101.5 fL — ABNORMAL HIGH (ref 78.0–100.0)
Platelets: 281 10*3/uL (ref 150–400)
RBC: 3.92 MIL/uL (ref 3.87–5.11)
RDW: 12.9 % (ref 11.5–15.5)
WBC: 7.6 10*3/uL (ref 4.0–10.5)

## 2012-09-19 NOTE — H&P (Signed)
[name]  DOB: 05/04/1935 Divorced / Language: English / Race: White Female  Date of Admission:  09/26/2012  Chief Complaint:  Right Knee Recurrent Patella Tendon Rupture  History of Present Illness The patient is a 76 year old female who comes in today for a preoperative History and Physical. The patient is scheduled for a right knee extensor mechanism reconstruction to be performed by Dr. Frank V. Aluisio, MD at [facility] on 09/26/2012. Unfortunately, she has reruptured her tendon. She has had multiple reconstructions already. She is going to need an entire extensor mechanism allograft. This has been discussed all in detail with the patient. The other thing I am probably going to do is put a thicker poly as she is now starting to hyperextend a little. She is ready to proceed with surgery.  Problem List/Past Medical S/P total knee arthroplasty (V43.65) S/P Left total knee arthroplasty (V43.65) Patellar tendon rupture (844.8). repair Arthritis, rheumatoid (714.0). 07/12/1994 Osteoarthrosis NOS, lower leg (715.96). 10/15/2009 Fx, peri-prosthetic, around prosthetic joint (996.44). 06/18/2010 Shingles Heart murmur Rheumatoid Arthritis   Allergies Sulfanomides   Social History No alcohol use Tobacco use. Former smoker. Post-Surgical Plans. Plan is to go home. Advance Directives. Living Will, Healthcare POA Living situation. Lives alone.   Medication History Methotrexate (2.5MG Tablet, Oral) Active. Boniva (150MG Tablet, Oral) Active. Folic Acid Xtra ( Oral) Active. Premarin (0.3MG Tablet, Oral) Active. Crestor (10MG Tablet, Oral) Active. Flexeril (10MG Tablet, Oral) Active. Norco (5-325MG Tablet, 1 (one) Oral po bid prn pain, Taken starting 09/17/2012) Active. ([phone]) PredniSONE (5MG Tablet, Oral) Active. (PRN for flares) Omega-3 Fish Oil ( Oral) Specific dose unknown - Active. Centrum Silver ( Oral) Active. Algae  Extract Active. Vitamin D3 (10000UNIT Capsule, Oral) Active. Vitamin C (500MG Capsule, Oral) Active.   Past Surgical History Hysterectomy. Date: 02/1982. Total Knee Replacement - Right. Date: 1990. Wrist Surgery. Date: 2000. Cholecystectomy. Date: 2003. Total Knee Replacement - Left. Date: 12/2009. ORIF Left Patella Fracture. Date: 01/2010. Left Patella Reconstruction. Date: 02/2010. Left Extensor Mechanism Repair. Date: 08/2010. Patella Tendon / Extensor Mechanism Reconstruction Left Knee. Date: 07/2011.   Review of Systems General:Not Present- Chills, Fever, Night Sweats, Fatigue, Weight Gain, Weight Loss and Memory Loss. Skin:Not Present- Hives, Itching, Rash, Eczema and Lesions. HEENT:Not Present- Tinnitus, Headache, Double Vision, Visual Loss, Hearing Loss and Dentures. Respiratory:Not Present- Shortness of breath with exertion, Shortness of breath at rest, Allergies, Coughing up blood and Chronic Cough. Cardiovascular:Not Present- Chest Pain, Racing/skipping heartbeats, Difficulty Breathing Lying Down, Murmur, Swelling and Palpitations. Gastrointestinal:Not Present- Bloody Stool, Heartburn, Abdominal Pain, Vomiting, Nausea, Constipation, Diarrhea, Difficulty Swallowing, Jaundice and Loss of appetitie. Female Genitourinary:Not Present- Blood in Urine, Urinary frequency, Weak urinary stream, Discharge, Flank Pain, Incontinence, Painful Urination, Urgency, Urinary Retention and Urinating at Night. Musculoskeletal:Present- Muscle Weakness. Not Present- Muscle Pain, Joint Swelling, Joint Pain, Back Pain, Morning Stiffness and Spasms. Neurological:Not Present- Tremor, Dizziness, Blackout spells, Paralysis, Difficulty with balance and Weakness. Psychiatric:Not Present- Insomnia.   Vitals Pulse: 76 (Regular) Resp.: 12 (Unlabored) BP: 136/78 (Sitting, Right Arm, Standard)    Physical Exam The physical exam findings are as follows:   General Mental  Status - Alert, cooperative and good historian. General Appearance- pleasant. Not in acute distress. Orientation- Oriented X3. Build & Nutrition- Well nourished and Well developed.   Head and Neck Head- normocephalic, atraumatic . Neck Global Assessment- supple. no bruit auscultated on the right and no bruit auscultated on the left.   Eye Pupil- Bilateral- Regular and Round.   Motion- Bilateral- EOMI.   Chest and Lung Exam Auscultation: Breath sounds:- clear at anterior chest wall and - clear at posterior chest wall. Adventitious sounds:- No Adventitious sounds.   Cardiovascular Auscultation:Rhythm- Regular rate and rhythm. Heart Sounds- S1 WNL and S2 WNL. Murmurs & Other Heart Sounds: Murmur 1:Location- Aortic Area and Pulmonic Area. Timing- Early systolic. Grade- II/VI.   Abdomen Inspection:Contour- Generalized moderate distention. Palpation/Percussion:Tenderness- Abdomen is non-tender to palpation. Rigidity (guarding)- Abdomen is soft. Auscultation:Auscultation of the abdomen reveals - Bowel sounds normal.   Female Genitourinary  Not done, not pertinent to present illness  Musculoskeletal On exam she is in no distress. Her left knee shows no swelling. Her patella appears to be about an inch higher than it was last visit. She can no longer actively extend the knee. She has about a 45 degree extensor lag. She does not have any instability noted.  Assessment & Plan Patellar tendon rupture (844.8) Story: repair  Fx, peri-prosthetic, around prosthetic joint (996.44) Impression: Patella Fracture  Note: Plan is for a Right Knee Extensor Mechanism Reconstruction by Dr. Aluisio.  Plan is to go home.  PCP - Dr. Perini  Time spent ~ 30 minutes  Signed electronically by Tijah Hane L Ysabel Stankovich, III PA-C  

## 2012-09-19 NOTE — Progress Notes (Addendum)
ekg 09-04-2012 dr perrini on chart Echo 09-13-12 echo epic

## 2012-09-19 NOTE — Patient Instructions (Addendum)
20 [name]  09/19/2012   Your procedure is scheduled on: 09-26-2012  Report to [facility] at 1100 AM.  Call this number if you have problems the morning of surgery [phone]   Remember:   Do not eat food  :After Midnight.   clear liquids midnight until 730 am day of surgery, then nothing by mouth   Take these medicines the morning of surgery with A SIP OF WATER: eye drops, afrin nasal spray, hydrocodone if needed                                SEE [facility] PREPARING FOR SURGERY SHEET   Do not wear jewelry, make-up or nail polish.  Do not wear lotions, powders, or perfumes. You may wear deodorant.   Men may shave face and neck.  Do not bring valuables to the hospital. [facility] IS NOT RESPONSIBLE FOR VALUEABLES.  Contacts, dentures or bridgework may not be worn into surgery.  Leave suitcase in the car. After surgery it may be brought to your room.  For patients admitted to the hospital, checkout time is 11:00 AM the day of discharge.   Patients discharged the day of surgery will not be allowed to drive home.  Name and phone number of your driver:  Special Instructions: N/A   Please read over the following fact sheets that you were given: MRSA Information.  Call Cain Sieve RN pre op nurse if needed [phone]    FAILURE TO FOLLOW THESE INSTRUCTIONS MAY RESULT IN THE CANCELLATION OF YOUR SURGERY.  PATIENT SIGNATURE___________________________________________  NURSE SIGNATURE_____________________________________________

## 2012-09-20 ENCOUNTER — Other Ambulatory Visit: Payer: Self-pay | Admitting: Orthopedic Surgery

## 2012-09-26 ENCOUNTER — Inpatient Hospital Stay (HOSPITAL_COMMUNITY): Payer: Medicare Other | Admitting: Anesthesiology

## 2012-09-26 ENCOUNTER — Encounter (HOSPITAL_COMMUNITY): Payer: Self-pay | Admitting: *Deleted

## 2012-09-26 ENCOUNTER — Encounter (HOSPITAL_COMMUNITY)
Admission: RE | Disposition: A | Discharge status: Home / Self Care | Payer: Self-pay | Source: Ambulatory Visit | Attending: Orthopedic Surgery

## 2012-09-26 ENCOUNTER — Inpatient Hospital Stay (HOSPITAL_COMMUNITY)
Admission: RE | Admit: 2012-09-26 | Discharge: 2012-09-28 | DRG: 489 | Disposition: A | Discharge status: Home / Self Care | Payer: Medicare Other | Source: Ambulatory Visit | Attending: Orthopedic Surgery | Admitting: Orthopedic Surgery

## 2012-09-26 ENCOUNTER — Encounter (HOSPITAL_COMMUNITY): Payer: Self-pay | Admitting: Anesthesiology

## 2012-09-26 DIAGNOSIS — Z79899 Other long term (current) drug therapy: Secondary | ICD-10-CM | POA: Diagnosis not present

## 2012-09-26 DIAGNOSIS — M069 Rheumatoid arthritis, unspecified: Secondary | ICD-10-CM | POA: Diagnosis not present

## 2012-09-26 DIAGNOSIS — M66269 Spontaneous rupture of extensor tendons, unspecified lower leg: Secondary | ICD-10-CM | POA: Diagnosis not present

## 2012-09-26 DIAGNOSIS — Z96659 Presence of unspecified artificial knee joint: Secondary | ICD-10-CM | POA: Diagnosis not present

## 2012-09-26 DIAGNOSIS — S86812D Strain of other muscle(s) and tendon(s) at lower leg level, left leg, subsequent encounter: Secondary | ICD-10-CM

## 2012-09-26 HISTORY — PX: QUADRICEPS TENDON REPAIR: SHX756

## 2012-09-26 HISTORY — PX: KNEE RECONSTRUCTION: SHX5883

## 2012-09-26 SURGERY — REPAIR, TENDON, QUADRICEPS
Anesthesia: General | Site: Knee | Laterality: Left | Wound class: Clean

## 2012-09-26 MED ORDER — METOCLOPRAMIDE HCL 10 MG PO TABS: 5.0000 mg | ORAL_TABLET | Freq: Three times a day (TID) | ORAL | Status: DC | PRN

## 2012-09-26 MED ORDER — LACTATED RINGERS IV SOLN
INTRAVENOUS | Status: DC | PRN
  Administered 2012-09-26 (×2): via INTRAVENOUS

## 2012-09-26 MED ORDER — MEPERIDINE HCL 50 MG/ML IJ SOLN: 6.2500 mg | INTRAMUSCULAR | Status: DC | PRN

## 2012-09-26 MED ORDER — OXYCODONE HCL 5 MG PO TABS: 5.0000 mg | ORAL_TABLET | ORAL | Status: DC | PRN

## 2012-09-26 MED ORDER — ACETAMINOPHEN 10 MG/ML IV SOLN
1000.0000 mg | Freq: Once | INTRAVENOUS | Status: DC
  Filled 2012-09-26: qty 100

## 2012-09-26 MED ORDER — LIDOCAINE HCL (CARDIAC) 20 MG/ML IV SOLN
INTRAVENOUS | Status: DC | PRN
  Administered 2012-09-26: 40 mg via INTRAVENOUS

## 2012-09-26 MED ORDER — ACETAMINOPHEN 10 MG/ML IV SOLN
INTRAVENOUS | Status: DC | PRN
  Administered 2012-09-26: 1000 mg via INTRAVENOUS

## 2012-09-26 MED ORDER — TRAMADOL HCL 50 MG PO TABS: 50.0000 mg | ORAL_TABLET | Freq: Four times a day (QID) | ORAL | Status: DC | PRN

## 2012-09-26 MED ORDER — CEFAZOLIN SODIUM 1-5 GM-% IV SOLN
1.0000 g | Freq: Four times a day (QID) | INTRAVENOUS | Status: AC
  Administered 2012-09-26 – 2012-09-27 (×3): 1 g via INTRAVENOUS
  Filled 2012-09-26 (×3): qty 50

## 2012-09-26 MED ORDER — DEXAMETHASONE SODIUM PHOSPHATE 10 MG/ML IJ SOLN: 10.0000 mg | Freq: Once | INTRAMUSCULAR | Status: DC

## 2012-09-26 MED ORDER — HYDROMORPHONE HCL PF 1 MG/ML IJ SOLN
0.5000 mg | INTRAMUSCULAR | Status: DC | PRN
  Administered 2012-09-26: 1 mg via INTRAVENOUS
  Filled 2012-09-26: qty 1

## 2012-09-26 MED ORDER — METHOCARBAMOL 500 MG PO TABS
500.0000 mg | ORAL_TABLET | Freq: Four times a day (QID) | ORAL | Status: DC | PRN
  Administered 2012-09-26 – 2012-09-28 (×5): 500 mg via ORAL
  Filled 2012-09-26 (×5): qty 1

## 2012-09-26 MED ORDER — CEFAZOLIN SODIUM-DEXTROSE 2-3 GM-% IV SOLR
2.0000 g | INTRAVENOUS | Status: AC
Start: 2012-09-26 — End: 2012-09-26
  Administered 2012-09-26: 2 g via INTRAVENOUS

## 2012-09-26 MED ORDER — ENOXAPARIN SODIUM 40 MG/0.4ML ~~LOC~~ SOLN
40.0000 mg | SUBCUTANEOUS | Status: DC
  Administered 2012-09-26 – 2012-09-27 (×2): 40 mg via SUBCUTANEOUS
  Filled 2012-09-26 (×3): qty 0.4

## 2012-09-26 MED ORDER — OXYCODONE HCL 5 MG/5ML PO SOLN
5.0000 mg | Freq: Once | ORAL | Status: DC | PRN
  Filled 2012-09-26: qty 5

## 2012-09-26 MED ORDER — OXYCODONE HCL 5 MG PO TABS
5.0000 mg | ORAL_TABLET | ORAL | Status: DC | PRN
  Administered 2012-09-26: 10 mg via ORAL
  Filled 2012-09-26: qty 2

## 2012-09-26 MED ORDER — NAPHAZOLINE HCL 0.1 % OP SOLN
1.0000 [drp] | Freq: Four times a day (QID) | OPHTHALMIC | Status: DC | PRN
  Filled 2012-09-26: qty 15

## 2012-09-26 MED ORDER — OXYCODONE HCL 5 MG PO TABS
5.0000 mg | ORAL_TABLET | ORAL | Status: DC | PRN
  Administered 2012-09-27 – 2012-09-28 (×10): 10 mg via ORAL
  Filled 2012-09-26 (×10): qty 2

## 2012-09-26 MED ORDER — METHOCARBAMOL 100 MG/ML IJ SOLN
500.0000 mg | Freq: Four times a day (QID) | INTRAVENOUS | Status: DC | PRN
  Administered 2012-09-26: 500 mg via INTRAVENOUS
  Filled 2012-09-26 (×2): qty 5

## 2012-09-26 MED ORDER — BUPIVACAINE LIPOSOME 1.3 % IJ SUSP
20.0000 mL | Freq: Once | INTRAMUSCULAR | Status: DC
  Filled 2012-09-26: qty 20

## 2012-09-26 MED ORDER — SODIUM CHLORIDE 0.9 % IV SOLN
INTRAVENOUS | Status: DC
  Administered 2012-09-27: 02:00:00 via INTRAVENOUS

## 2012-09-26 MED ORDER — FENTANYL CITRATE 0.05 MG/ML IJ SOLN
25.0000 ug | INTRAMUSCULAR | Status: DC | PRN
  Administered 2012-09-26 (×4): 25 ug via INTRAVENOUS

## 2012-09-26 MED ORDER — HYDROMORPHONE HCL PF 1 MG/ML IJ SOLN
0.2500 mg | INTRAMUSCULAR | Status: DC | PRN
  Administered 2012-09-26 (×4): 0.5 mg via INTRAVENOUS

## 2012-09-26 MED ORDER — ONDANSETRON HCL 4 MG/2ML IJ SOLN
INTRAMUSCULAR | Status: DC | PRN
  Administered 2012-09-26: 4 mg via INTRAVENOUS

## 2012-09-26 MED ORDER — FENTANYL CITRATE 0.05 MG/ML IJ SOLN
INTRAMUSCULAR | Status: DC | PRN
  Administered 2012-09-26 (×2): 50 ug via INTRAVENOUS

## 2012-09-26 MED ORDER — ACETAMINOPHEN 500 MG PO TABS: 1000.0000 mg | ORAL_TABLET | Freq: Four times a day (QID) | ORAL | Status: DC | PRN

## 2012-09-26 MED ORDER — LACTATED RINGERS IV SOLN
INTRAVENOUS | Status: DC
  Administered 2012-09-26: 1000 mL via INTRAVENOUS

## 2012-09-26 MED ORDER — OXYCODONE HCL 5 MG PO TABS: 5.0000 mg | ORAL_TABLET | Freq: Once | ORAL | Status: DC | PRN

## 2012-09-26 MED ORDER — NAPHAZOLINE-GLYCERIN 0.012-0.2 % OP SOLN: 1.0000 [drp] | OPHTHALMIC | Status: DC | PRN

## 2012-09-26 MED ORDER — PROMETHAZINE HCL 25 MG/ML IJ SOLN: 6.2500 mg | INTRAMUSCULAR | Status: DC | PRN

## 2012-09-26 MED ORDER — METOCLOPRAMIDE HCL 5 MG/ML IJ SOLN
5.0000 mg | Freq: Three times a day (TID) | INTRAMUSCULAR | Status: DC | PRN
  Administered 2012-09-26: 10 mg via INTRAVENOUS
  Filled 2012-09-26: qty 2

## 2012-09-26 MED ORDER — 0.9 % SODIUM CHLORIDE (POUR BTL) OPTIME
TOPICAL | Status: DC | PRN
  Administered 2012-09-26: 1000 mL

## 2012-09-26 MED ORDER — SODIUM CHLORIDE 0.9 % IV SOLN: INTRAVENOUS | Status: DC

## 2012-09-26 MED ORDER — HYDROMORPHONE HCL PF 1 MG/ML IJ SOLN
0.5000 mg | INTRAMUSCULAR | Status: DC | PRN
  Administered 2012-09-26: 0.5 mg via INTRAVENOUS
  Filled 2012-09-26: qty 1

## 2012-09-26 MED ORDER — FOLIC ACID 1 MG PO TABS
1.0000 mg | ORAL_TABLET | Freq: Every day | ORAL | Status: DC
  Administered 2012-09-27 – 2012-09-28 (×2): 1 mg via ORAL
  Filled 2012-09-26 (×3): qty 1

## 2012-09-26 MED ORDER — ONDANSETRON HCL 4 MG PO TABS: 4.0000 mg | ORAL_TABLET | Freq: Four times a day (QID) | ORAL | Status: DC | PRN

## 2012-09-26 MED ORDER — ONDANSETRON HCL 4 MG/2ML IJ SOLN: 4.0000 mg | Freq: Four times a day (QID) | INTRAMUSCULAR | Status: DC | PRN

## 2012-09-26 MED ORDER — MIDAZOLAM HCL 5 MG/5ML IJ SOLN
INTRAMUSCULAR | Status: DC | PRN
  Administered 2012-09-26: 2 mg via INTRAVENOUS

## 2012-09-26 MED ORDER — PROPOFOL 10 MG/ML IV BOLUS
INTRAVENOUS | Status: DC | PRN
  Administered 2012-09-26: 150 mg via INTRAVENOUS

## 2012-09-26 MED ORDER — DEXAMETHASONE SODIUM PHOSPHATE 10 MG/ML IJ SOLN
INTRAMUSCULAR | Status: DC | PRN
  Administered 2012-09-26: 10 mg via INTRAVENOUS

## 2012-09-26 MED ORDER — CYCLOBENZAPRINE HCL 10 MG PO TABS: 10.0000 mg | ORAL_TABLET | Freq: Three times a day (TID) | ORAL | Status: DC | PRN

## 2012-09-26 SURGICAL SUPPLY — 64 items
BAG SPEC THK2 15X12 ZIP CLS (MISCELLANEOUS)
BAG ZIPLOCK 12X15 (MISCELLANEOUS) ×1 IMPLANT
BANDAGE ELASTIC 6 VELCRO ST LF (GAUZE/BANDAGES/DRESSINGS) ×2 IMPLANT
BANDAGE ESMARK 6X9 LF (GAUZE/BANDAGES/DRESSINGS) IMPLANT
BIT DRILL 2.5X2.75 QC CALB (BIT) ×1 IMPLANT
BIT DRILL 2.8X5 QR DISP (BIT) ×2 IMPLANT
BLADE MIC 41X13 (BLADE) ×1 IMPLANT
BNDG CMPR 9X6 STRL LF SNTH (GAUZE/BANDAGES/DRESSINGS) ×1
BNDG ESMARK 6X9 LF (GAUZE/BANDAGES/DRESSINGS) ×2
CLOTH BEACON ORANGE TIMEOUT ST (SAFETY) ×2 IMPLANT
DRAPE POUCH INSTRU U-SHP 10X18 (DRAPES) ×2 IMPLANT
DRAPE U-SHAPE 47X51 STRL (DRAPES) ×2 IMPLANT
DRSG EMULSION OIL 3X16 NADH (GAUZE/BANDAGES/DRESSINGS) ×1 IMPLANT
DRSG EMULSION OIL 3X3 NADH (GAUZE/BANDAGES/DRESSINGS) ×1 IMPLANT
DRSG PAD ABDOMINAL 8X10 ST (GAUZE/BANDAGES/DRESSINGS) ×1 IMPLANT
DURAPREP 26ML APPLICATOR (WOUND CARE) ×2 IMPLANT
ELECT REM PT RETURN 9FT ADLT (ELECTROSURGICAL) ×2
ELECTRODE REM PT RTRN 9FT ADLT (ELECTROSURGICAL) ×1 IMPLANT
GAUZE SPONGE 4X4 16PLY XRAY LF (GAUZE/BANDAGES/DRESSINGS) ×1 IMPLANT
GLOVE BIO SURGEON STRL SZ7.5 (GLOVE) ×2 IMPLANT
GLOVE BIO SURGEON STRL SZ8 (GLOVE) ×2 IMPLANT
GLOVE BIOGEL PI IND STRL 8 (GLOVE) ×3 IMPLANT
GLOVE BIOGEL PI INDICATOR 8 (GLOVE) ×3
GLOVE SURG SS PI 7.5 STRL IVOR (GLOVE) ×2 IMPLANT
GOWN SRG XL XLNG 56XLVL 4 (GOWN DISPOSABLE) IMPLANT
GOWN STRL NON-REIN LRG LVL3 (GOWN DISPOSABLE) ×2 IMPLANT
GOWN STRL NON-REIN XL XLG LVL4 (GOWN DISPOSABLE) ×2
GOWN STRL REIN 2XL XLG LVL4 (GOWN DISPOSABLE) ×1 IMPLANT
GOWN STRL REIN XL XLG (GOWN DISPOSABLE) ×2 IMPLANT
GRAFT ACHILLES CALC BNE BLCK (Bone Implant) IMPLANT
GRAFT ACHILLES TENDON (Bone Implant) ×2 IMPLANT
IMMOBILIZER KNEE 20 (SOFTGOODS) ×2
IMMOBILIZER KNEE 20 THIGH 36 (SOFTGOODS) ×1 IMPLANT
INSERT PFC SIG STB SZ 2.5 20.0 (Knees) ×1 IMPLANT
KIT BASIN OR (CUSTOM PROCEDURE TRAY) ×2 IMPLANT
MANIFOLD NEPTUNE II (INSTRUMENTS) ×2 IMPLANT
NDL MA TROC 1/2 CIR (NEEDLE) ×1 IMPLANT
NEEDLE MA TROC 1/2 CIR (NEEDLE) ×2 IMPLANT
NS IRRIG 1000ML POUR BTL (IV SOLUTION) ×2 IMPLANT
PACK ICE MAXI GEL EZY WRAP (MISCELLANEOUS) ×2 IMPLANT
PACK TOTAL JOINT (CUSTOM PROCEDURE TRAY) ×2 IMPLANT
PAD ABD 7.5X8 STRL (GAUZE/BANDAGES/DRESSINGS) ×3 IMPLANT
PADDING CAST COTTON 6X4 STRL (CAST SUPPLIES) ×2 IMPLANT
PASSER SUT SWANSON 36MM LOOP (INSTRUMENTS) ×2 IMPLANT
POSITIONER SURGICAL ARM (MISCELLANEOUS) ×2 IMPLANT
SCREW NLOCK CANC HEX 4X30 (Screw) ×2 IMPLANT
SCREW NLOCK T15 FT 30X4XST TIP (Screw) IMPLANT
SPONGE GAUZE 4X4 12PLY (GAUZE/BANDAGES/DRESSINGS) ×2 IMPLANT
STAPLER VISISTAT 35W (STAPLE) ×1 IMPLANT
STRIP CLOSURE SKIN 1/2X4 (GAUZE/BANDAGES/DRESSINGS) ×2 IMPLANT
SUT ETHIBOND 2 OS 4 DA (SUTURE) ×13 IMPLANT
SUT FIBERWIRE #2 38 T-5 BLUE (SUTURE) ×6
SUT FIBERWIRE 2-0 18 17.9 3/8 (SUTURE) ×4
SUT MNCRL AB 4-0 PS2 18 (SUTURE) ×2 IMPLANT
SUT VIC AB 0 CT1 27 (SUTURE) ×4
SUT VIC AB 0 CT1 27XBRD ANTBC (SUTURE) ×2 IMPLANT
SUT VIC AB 2-0 CT1 27 (SUTURE) ×6
SUT VIC AB 2-0 CT1 TAPERPNT 27 (SUTURE) ×2 IMPLANT
SUTURE FIBERWR #2 38 T-5 BLUE (SUTURE) IMPLANT
SUTURE FIBERWR 2-0 18 17.9 3/8 (SUTURE) IMPLANT
TOWEL OR 17X26 10 PK STRL BLUE (TOWEL DISPOSABLE) ×4 IMPLANT
WASHER CUP TIMAX (Trauma) ×1 IMPLANT
WASHER FLAT ACE (Orthopedic Implant) ×1 IMPLANT
WASHER PLAIN FLAT ACE NS 3PK (Orthopedic Implant) IMPLANT

## 2012-09-26 NOTE — Transfer of Care (Signed)
Immediate Anesthesia Transfer of Care Note  Patient: [name]  Procedure(s) Performed: Procedure(s) (LRB): LEFT EXTENSOR MECHANISM RECONSTRUCTION/WITH GRAFT (Left)  Patient Location: PACU  Anesthesia Type: General  Level of Consciousness: sedated, patient cooperative and responds to stimulaton  Airway & Oxygen Therapy: Patient Spontanous Breathing and Patient connected to face mask oxgen  Post-op Assessment: Report given to PACU RN and Post -op Vital signs reviewed and stable  Post vital signs: Reviewed and stable  Complications: No apparent anesthesia complications

## 2012-09-26 NOTE — Interval H&P Note (Signed)
History and Physical Interval Note:  09/26/2012 1:44 PM  [name]  has presented today for surgery, with the diagnosis of Recurrent rupture of the left patella  The various methods of treatment have been discussed with the patient and family. After consideration of risks, benefits and other options for treatment, the patient has consented to  Procedure(s): LEFT EXTENSOR MECHANISM RECONSTRUCTION (Left) as a surgical intervention .  The patient's history has been reviewed, patient examined, no change in status, stable for surgery.  I have reviewed the patient's chart and labs.  Questions were answered to the patient's satisfaction.     Loanne Drilling

## 2012-09-26 NOTE — Anesthesia Preprocedure Evaluation (Addendum)
Anesthesia Evaluation  Patient identified by MRN, date of birth, ID band Patient awake    Reviewed: Allergy & Precautions, H&P , NPO status , Patient's Chart, lab work & pertinent test results  History of Anesthesia Complications (+) PONV  Airway Mallampati: II TM Distance: >3 FB Neck ROM: Full    Dental no notable dental hx. (+) Dental Advisory Given   Pulmonary neg pulmonary ROS,  breath sounds clear to auscultation  Pulmonary exam normal       Cardiovascular negative cardio ROS  Rhythm:Regular Rate:Normal     Neuro/Psych negative neurological ROS  negative psych ROS   GI/Hepatic negative GI ROS, Neg liver ROS,   Endo/Other  negative endocrine ROS  Renal/GU negative Renal ROS     Musculoskeletal negative musculoskeletal ROS (+) Arthritis -, Rheumatoid disorders,    Abdominal   Peds  Hematology negative hematology ROS (+)   Anesthesia Other Findings 3 perm bridges. Front teeth bonded  Reproductive/Obstetrics negative OB ROS                           Anesthesia Physical  Anesthesia Plan  ASA: II  Anesthesia Plan: General   Post-op Pain Management:    Induction: Intravenous  Airway Management Planned: LMA  Additional Equipment:   Intra-op Plan:   Post-operative Plan: Extubation in OR  Informed Consent: I have reviewed the patients History and Physical, chart, labs and discussed the procedure including the risks, benefits and alternatives for the proposed anesthesia with the patient or authorized representative who has indicated his/her understanding and acceptance.   Dental advisory given  Plan Discussed with: CRNA  Anesthesia Plan Comments:         Anesthesia Quick Evaluation

## 2012-09-26 NOTE — Brief Op Note (Signed)
09/26/2012  3:52 PM  PATIENT:  [name]  77 y.o. female  PRE-OPERATIVE DIAGNOSIS:  Recurrent rupture of the left patella tendon  POST-OPERATIVE DIAGNOSIS:  Recurrent rupture of the left patella tendon  PROCEDURE:  Procedure(s): LEFT EXTENSOR MECHANISM RECONSTRUCTION (Left)  SURGEON:  Surgeon(s) and Role:    * Loanne Drilling, MD - Primary  PHYSICIAN ASSISTANT:   ASSISTANTS: Leilani Able, PA-C   ANESTHESIA:   general  EBL:  Total I/O In: -  Out: 100 [Urine:100]  DRAINS: none   LOCAL MEDICATIONS USED:  NONE  COUNTS:  YES  TOURNIQUET:   Total Tourniquet Time Documented: Upper Arm (Left) - 77 minutes Total: Upper Arm (Left) - 77 minutes   DICTATION: .Other Dictation: Dictation Number [id number]  PLAN OF CARE: Admit to inpatient   PATIENT DISPOSITION:  PACU - hemodynamically stable.

## 2012-09-26 NOTE — Preoperative (Signed)
Beta Blockers   Reason not to administer Beta Blockers:Not Applicable 

## 2012-09-26 NOTE — Anesthesia Postprocedure Evaluation (Signed)
Anesthesia Post Note  Patient: [name]  Procedure(s) Performed: Procedure(s) (LRB): LEFT EXTENSOR MECHANISM RECONSTRUCTION/WITH GRAFT (Left)  Anesthesia type: General  Patient location: PACU  Post pain: Pain level controlled  Post assessment: Post-op Vital signs reviewed  Last Vitals: BP 160/98  Pulse 100  Temp(Src) 37.3 C (Oral)  Resp 16  Ht 5\' 4"  (1.626 m)  Wt 212 lb (96.163 kg)  BMI 36.37 kg/m2  SpO2 96%  Post vital signs: Reviewed  Level of consciousness: sedated  Complications: No apparent anesthesia complications

## 2012-09-26 NOTE — H&P (View-Only) (Signed)
[name]  DOB: Oct 09, 1935 Divorced / Language: [place] / Race: White Female  Date of Admission:  09/26/2012  Chief Complaint:  Right Knee Recurrent Patella Tendon Rupture  History of Present Illness The patient is a 77 year old female who comes in today for a preoperative History and Physical. The patient is scheduled for a right knee extensor mechanism reconstruction to be performed by Dr. Gus Rankin. Aluisio, MD at [facility] on 09/26/2012. Unfortunately, she has reruptured her tendon. She has had multiple reconstructions already. She is going to need an entire extensor mechanism allograft. This has been discussed all in detail with the patient. The other thing I am probably going to do is put a thicker poly as she is now starting to hyperextend a little. She is ready to proceed with surgery.  Problem List/Past Medical S/P total knee arthroplasty (V43.65) S/P Left total knee arthroplasty (V43.65) Patellar tendon rupture (844.8). repair Arthritis, rheumatoid (714.0). 07/12/1994 Osteoarthrosis NOS, lower leg (715.96). 10/15/2009 Fx, peri-prosthetic, around prosthetic joint (996.44). 06/18/2010 Shingles Heart murmur Rheumatoid Arthritis   Allergies Sulfanomides   Social History No alcohol use Tobacco use. Former smoker. Post-Surgical Plans. Plan is to go home. Advance Directives. Living Will, Healthcare POA Living situation. Lives alone.   Medication History Methotrexate (2.5MG  Tablet, Oral) Active. Boniva (150MG  Tablet, Oral) Active. Folic Acid Xtra ( Oral) Active. Premarin (0.3MG  Tablet, Oral) Active. Crestor (10MG  Tablet, Oral) Active. Flexeril (10MG  Tablet, Oral) Active. Norco (5-325MG  Tablet, 1 (one) Oral po bid prn pain, Taken starting 09/17/2012) Active. ([phone]) PredniSONE (5MG  Tablet, Oral) Active. (PRN for flares) Omega-3 Fish Oil ( Oral) Specific dose unknown - Active. Centrum Silver ( Oral) Active. Algae  Extract Active. Vitamin D3 (10000UNIT Capsule, Oral) Active. Vitamin C (500MG  Capsule, Oral) Active.   Past Surgical History Hysterectomy. Date: 02/1982. Total Knee Replacement - Right. Date: 80. Wrist Surgery. Date: 2000. Cholecystectomy. Date: 2003. Total Knee Replacement - Left. Date: 12/2009. ORIF Left Patella Fracture. Date: 01/2010. Left Patella Reconstruction. Date: 02/2010. Left Extensor Mechanism Repair. Date: 08/2010. Patella Tendon / Extensor Mechanism Reconstruction Left Knee. Date: 07/2011.   Review of Systems General:Not Present- Chills, Fever, Night Sweats, Fatigue, Weight Gain, Weight Loss and Memory Loss. Skin:Not Present- Hives, Itching, Rash, Eczema and Lesions. HEENT:Not Present- Tinnitus, Headache, Double Vision, Visual Loss, Hearing Loss and Dentures. Respiratory:Not Present- Shortness of breath with exertion, Shortness of breath at rest, Allergies, Coughing up blood and Chronic Cough. Cardiovascular:Not Present- Chest Pain, Racing/skipping heartbeats, Difficulty Breathing Lying Down, Murmur, Swelling and Palpitations. Gastrointestinal:Not Present- Bloody Stool, Heartburn, Abdominal Pain, Vomiting, Nausea, Constipation, Diarrhea, Difficulty Swallowing, Jaundice and Loss of appetitie. Female Genitourinary:Not Present- Blood in Urine, Urinary frequency, Weak urinary stream, Discharge, Flank Pain, Incontinence, Painful Urination, Urgency, Urinary Retention and Urinating at Night. Musculoskeletal:Present- Muscle Weakness. Not Present- Muscle Pain, Joint Swelling, Joint Pain, Back Pain, Morning Stiffness and Spasms. Neurological:Not Present- Tremor, Dizziness, Blackout spells, Paralysis, Difficulty with balance and Weakness. Psychiatric:Not Present- Insomnia.   Vitals Pulse: 76 (Regular) Resp.: 12 (Unlabored) BP: 136/78 (Sitting, Right Arm, Standard)    Physical Exam The physical exam findings are as follows:   General Mental  Status - Alert, cooperative and good historian. General Appearance- pleasant. Not in acute distress. Orientation- Oriented X3. Build & Nutrition- Well nourished and Well developed.   Head and Neck Head- normocephalic, atraumatic . Neck Global Assessment- supple. no bruit auscultated on the right and no bruit auscultated on the left.   Eye Pupil- Bilateral- Regular and Round.  Motion- Bilateral- EOMI.   Chest and Lung Exam Auscultation: Breath sounds:- clear at anterior chest wall and - clear at posterior chest wall. Adventitious sounds:- No Adventitious sounds.   Cardiovascular Auscultation:Rhythm- Regular rate and rhythm. Heart Sounds- S1 WNL and S2 WNL. Murmurs & Other Heart Sounds: Murmur 1:Location- Aortic Area and Pulmonic Area. Timing- Early systolic. Grade- II/VI.   Abdomen Inspection:Contour- Generalized moderate distention. Palpation/Percussion:Tenderness- Abdomen is non-tender to palpation. Rigidity (guarding)- Abdomen is soft. Auscultation:Auscultation of the abdomen reveals - Bowel sounds normal.   Female Genitourinary  Not done, not pertinent to present illness  Musculoskeletal On exam she is in no distress. Her left knee shows no swelling. Her patella appears to be about an inch higher than it was last visit. She can no longer actively extend the knee. She has about a 45 degree extensor lag. She does not have any instability noted.  Assessment & Plan Patellar tendon rupture (844.8) Story: repair  Fx, peri-prosthetic, around prosthetic joint (996.44) Impression: Patella Fracture  Note: Plan is for a Right Knee Extensor Mechanism Reconstruction by Dr. Lequita Halt.  Plan is to go home.  PCP - Dr. Waynard Edwards  Time spent ~ 30 minutes  Signed electronically by Lauraine Rinne, III PA-C

## 2012-09-27 ENCOUNTER — Encounter (HOSPITAL_COMMUNITY): Payer: Self-pay | Admitting: Orthopedic Surgery

## 2012-09-27 MED ORDER — OXYCODONE HCL 5 MG PO TABS: 5.0000 mg | ORAL_TABLET | ORAL | Status: DC | PRN

## 2012-09-27 MED ORDER — TRAMADOL HCL 50 MG PO TABS: 50.0000 mg | ORAL_TABLET | Freq: Four times a day (QID) | ORAL | Status: DC | PRN

## 2012-09-27 NOTE — Op Note (Signed)
[name], [name]              ACCOUNT NO.:  [id number]  MEDICAL RECORD NO.:  [id number]  LOCATION:  1602                         FACILITY:  [facility]  PHYSICIAN:  Ollen Gross, M.D.    DATE OF BIRTH:  05-04-1935  DATE OF PROCEDURE:  09/26/2012 DATE OF DISCHARGE:                              OPERATIVE REPORT   PREOPERATIVE DIAGNOSIS:  Recurrent left patellar tendon rupture.  POSTOPERATIVE DIAGNOSIS:  Recurrent left patellar tendon rupture.  PROCEDURE:  Repair of extensor mechanism with extensor mechanism reconstruction, left knee.  SURGEON:  Ollen Gross, M.D.  ASSISTANT:  Jaquelyn Bitter. Chabon, PA-C  ANESTHESIA:  General.  ESTIMATED BLOOD LOSS:  Minimal.  DRAINS:  None.  TOURNIQUET TIME:  75 minutes at 300 mmHg.  COMPLICATIONS:  None.  CONDITION:  Stable to recovery.  BRIEF CLINICAL NOTE:  Ms. [name] is a 77 year old female with very complex history in regards to her left knee.  She had total knee arthroplasty and then postoperatively sustained a patellar tendon disruption.  She has had multiple repairs including an allograft reconstruction.  Unfortunately, she want onto have failure of the recent reconstruction.  She presents now for repeat reconstruction.  We had discussed in detail that perhaps patellectomy or complete extensor mechanism allograft, we provide another option, but she wanted to attempt to save her patella.  We felt that we could get adequate reconstruction with another allograft.  She presents now for extensor mechanism reconstruction.  PROCEDURE IN DETAIL:  After successful administration of general anesthetic, a tourniquet was placed on her left thigh.  Left lower extremity was prepped and draped in the usual sterile fashion. Extremity was wrapped in Esmarch.  Tourniquet was inflated to 300 mmHg. A midline incision was made with 10-blade through the subcutaneous tissue to the extensor mechanism.  The bony aspect of the graft was intact.  The  graft itself was intact, but had apparently stretched. There was healthy tissue overlying the graft and this was peeled away from the graft and safe for later reconstruction.  I then resected the graft from the tibial attachment.  I was then able to gain access to the joint.  She was having issues of hyperextension and some varus-valgus laxity.  I removed the tibial polyethylene, which was a size 2.5, 15-mm thick, and we went to a 20-mm thick poly, size 2.5, which corrected all the varus-valgus, anterior-posterior plane in the knee.  We then prepared the tibia and patella for the allograft reconstruction.  A frozen Achilles allograft with a bone plug was utilized.  We split the distal aspect of the tendon graft to allow an opportunity to weave it through the native quad tendon to pull the patella down.  I effectively mobilized all adhesions from the undersurface of the quadriceps mechanism and fortunately, there were not many adhesions at all.  This allowed to advance the patella down well within the trochlear groove. The bony aspect of patella was rather thin, so I did not feel it will be safe to drill holes and reattached the graft through the patella itself for fear of fracturing the patella.  We thus weaved the distal limbs of the allograft utilizing #2 FiberWire suture in banal type  fashion to create essentially two limbs of the graft.  I then anchored the bony plug down into the tibia tubercle.  First, I removed the previous graft and surprisingly, it had fully revascularized.  The screws were removed first, then the graft removed.  We created the bad time for the new graft.  It was impacted down into the bed and then two cancellous screws 4.0 were used to anchor it.  This was a very stable repair.  We then tensioned the patella down into the appropriate spot on the trochlea and weaved the allograft through.  Vertical split was made in the vastus medialis and rectus tendon.  We then  pulled the limbs of the graft to tension them and sewed them back to the quad tendon.  This effectively stabilized the patella in the appropriate position.  We then oversewed the limbs of the graft back to native quad tendon to enhance the repair. The medial and lateral retinaculum was then repaired to the allograft to ensure rupture repair even further.  The healthy tissue that we had saved previously was then wrapped over the graft and retinaculum and sewn to both.  I was very pleased with the stability of the repair. Prior to fully closing this, we thoroughly irrigated the joint with a liter of saline.  After completing the repair, we let the tourniquet down, total time of 75 minutes.  Minimal bleeding was encountered. Subcu was then closed with interrupted 2-0 Vicryl and skin closed with staples.  The incision was cleaned and dried and a bulky sterile dressing applied.  She was then placed into a knee immobilizer, awakened and transported to the recovery in stable condition.  Please note that a surgical assistant was a medical necessity for this procedure in order to allow for appropriate exposure of the knee joint for revision of the polyethylene component as well as to hold the graft in appropriate tension while it was being repaired.  Also assistance is necessary for further suturing of the graft as simultaneous suturing was necessary to get the appropriate tension.     Ollen Gross, M.D.     FA/MEDQ  D:  09/26/2012  T:  09/27/2012  Job:  [id number]

## 2012-09-27 NOTE — Progress Notes (Signed)
Subjective: 1 Day Post-Op Procedure(s) (LRB): LEFT EXTENSOR MECHANISM RECONSTRUCTION/WITH GRAFT (Left) Patient reports pain as moderate.    Objective: Vital signs in last 24 hours: Temp:  [98 F (36.7 C)-99.4 F (37.4 C)] 98.4 F (36.9 C) (08/21 0616) Pulse Rate:  [89-109] 100 (08/21 0616) Resp:  [9-23] 14 (08/21 0616) BP: (132-174)/(80-101) 142/81 mmHg (08/21 0616) SpO2:  [94 %-100 %] 97 % (08/21 0616) Weight:  [212 lb (96.163 kg)] 212 lb (96.163 kg) (08/20 1750)  Intake/Output from previous day: 08/20 0701 - 08/21 0700 In: 2290 [P.O.:240; I.V.:2050] Out: 3160 [Urine:3160] Intake/Output this shift:    No results found for this basename: HGB,  in the last 72 hours No results found for this basename: WBC, RBC, HCT, PLT,  in the last 72 hours No results found for this basename: NA, K, CL, CO2, BUN, CREATININE, GLUCOSE, CALCIUM,  in the last 72 hours No results found for this basename: LABPT, INR,  in the last 72 hours  Neurologically intact Neurovascular intact No cellulitis present Compartment soft  Assessment/Plan: 1 Day Post-Op Procedure(s) (LRB): LEFT EXTENSOR MECHANISM RECONSTRUCTION/WITH GRAFT (Left) Advance diet Up with therapy D/C IV fluids Plan for discharge tomorrow  [name] 09/27/2012, 7:28 AM

## 2012-09-27 NOTE — Evaluation (Signed)
Physical Therapy Evaluation Patient Details Name: [name] MRN: [id number] DOB: 1935-05-24 Today's Date: 09/27/2012 Time: 4098-1191 PT Time Calculation (min): 36 min  PT Assessment / Plan / Recommendation History of Present Illness     Clinical Impression  Pt s/p extensor mechanism repair of L knee presents with decreased L LE strength/ROM, post op pain and premorbid deconditioning limiting functional mobility    PT Assessment  Patient needs continued PT services    Follow Up Recommendations  Home health PT    Does the patient have the potential to tolerate intense rehabilitation      Barriers to Discharge        Equipment Recommendations  None recommended by PT    Recommendations for Other Services     Frequency 7X/week    Precautions / Restrictions Precautions Precautions: Fall Required Braces or Orthoses: Knee Immobilizer - Left Restrictions Weight Bearing Restrictions: No Other Position/Activity Restrictions: WBAT   Pertinent Vitals/Pain 6/10 with activity, premed, ice packs provided      Mobility  Bed Mobility Bed Mobility: Supine to Sit Supine to Sit: 4: Min assist;3: Mod assist;With rails Details for Bed Mobility Assistance: cues for sequence and use of R LE to self assist Transfers Transfers: Sit to Stand;Stand to Sit Sit to Stand: 4: Min assist Stand to Sit: 4: Min assist Details for Transfer Assistance: cues for LE management and use of UEs to self assist Ambulation/Gait Ambulation/Gait Assistance: 4: Min assist Ambulation Distance (Feet): 53 Feet Assistive device: Rolling walker Ambulation/Gait Assistance Details: cues for posture, sequence, position from RW and stride length Gait Pattern: Step-to pattern;Decreased step length - right;Decreased step length - left;Trunk flexed    Exercises     PT Diagnosis: Difficulty walking  PT Problem List: Decreased strength;Decreased range of motion;Decreased activity tolerance;Decreased  mobility;Pain;Obesity;Decreased knowledge of precautions;Decreased knowledge of use of DME PT Treatment Interventions: DME instruction;Gait training;Stair training;Functional mobility training;Therapeutic activities;Therapeutic exercise;Patient/family education     PT Goals(Current goals can be found in the care plan section) Acute Rehab PT Goals Patient Stated Goal: Home  PT Goal Formulation: With patient Time For Goal Achievement: 10/04/12 Potential to Achieve Goals: Good  Visit Information  Last PT Received On: 09/27/12 Assistance Needed: +1       Prior Functioning  Home Living Family/patient expects to be discharged to:: Private residence Living Arrangements: Alone Available Help at Discharge: Family Type of Home: House Home Access: Stairs to enter [employer] of Steps: 2 (2 low stairs) Entrance Stairs-Rails: None Home Layout: One level Home Equipment: Environmental consultant - 2 wheels;Bedside commode Prior Function Level of Independence: Independent with assistive device(s) Comments: hx of falls at home Communication Communication: No difficulties    Cognition  Cognition Arousal/Alertness: Awake/alert Behavior During Therapy: WFL for tasks assessed/performed Overall Cognitive Status: Within Functional Limits for tasks assessed    Extremity/Trunk Assessment Upper Extremity Assessment Upper Extremity Assessment: RUE deficits/detail;LUE deficits/detail RUE Deficits / Details: Bil arthritic shoulders Lower Extremity Assessment Lower Extremity Assessment: LLE deficits/detail LLE Deficits / Details: KI on at all times   Balance    End of Session PT - End of Session Equipment Utilized During Treatment: Gait belt;Left knee immobilizer Activity Tolerance: Patient tolerated treatment well Patient left: in chair;with call bell/phone within reach;with family/visitor present Nurse Communication: Mobility status  GP     Shawn Carattini 09/27/2012, 12:55 PM

## 2012-09-27 NOTE — Care Management Note (Signed)
    Page 1 of 1   09/27/2012     5:27:19 PM   CARE MANAGEMENT NOTE 09/27/2012  Patient:  [name], [name]   Account Number:  [id number]  Date Initiated:  09/27/2012  Documentation initiated by:  Colleen Can  Subjective/Objective Assessment:   dx recurrent rupture left patella tendon; tendon repair     Action/Plan:   Cm spoke with patient. Plans arefor her to return to her home where sister will be caregiver. She already has DME. NO HH needs.   Anticipated DC Date:  09/28/2012   Anticipated DC Plan:  HOME/SELF CARE      DC Planning Services  CM consult      Choice offered to / List presented to:             Status of service:  Completed, signed off Medicare Important Message given?  NA - LOS <3 / Initial given by admissions (If response is "NO", the following Medicare IM given date fields will be blank) Date Medicare IM given:   Date Additional Medicare IM given:    Discharge Disposition:    Per UR Regulation:    If discussed at Long Length of Stay Meetings, dates discussed:    Comments:

## 2012-09-27 NOTE — Progress Notes (Signed)
Utilization review completed.  

## 2012-09-27 NOTE — Progress Notes (Signed)
Physical Therapy Treatment Patient Details Name: [name] MRN: [id number] DOB: 1935/04/20 Today's Date: 09/27/2012 Time: 1410-1447 PT Time Calculation (min): 37 min  PT Assessment / Plan / Recommendation  History of Present Illness     PT Comments     Follow Up Recommendations  Home health PT     Does the patient have the potential to tolerate intense rehabilitation     Barriers to Discharge        Equipment Recommendations  None recommended by PT    Recommendations for Other Services    Frequency 7X/week   Progress towards PT Goals Progress towards PT goals: Progressing toward goals  Plan Current plan remains appropriate    Precautions / Restrictions Precautions Precautions: Fall Required Braces or Orthoses: Knee Immobilizer - Left Knee Immobilizer - Left: On at all times Restrictions Weight Bearing Restrictions: No Other Position/Activity Restrictions: WBAT   Pertinent Vitals/Pain 5/10; pain meds requested    Mobility  Bed Mobility Bed Mobility: Sit to Supine Sit to Supine: 3: Mod assist;4: Min assist Details for Bed Mobility Assistance: cues for sequence and use of R LE to self assist Transfers Transfers: Sit to Stand;Stand to Sit Sit to Stand: 4: Min assist;From chair/3-in-1 Stand to Sit: 4: Min assist;To bed Details for Transfer Assistance: cues for LE management and use of UEs to self assist Ambulation/Gait Ambulation/Gait Assistance: 4: Min assist Ambulation Distance (Feet): 85 Feet Assistive device: Rolling walker Ambulation/Gait Assistance Details: cues for posture and position from RW Gait Pattern: Step-to pattern;Decreased step length - right;Decreased step length - left;Trunk flexed;Step-through pattern Stairs: No    Exercises     PT Diagnosis:    PT Problem List:   PT Treatment Interventions:     PT Goals (current goals can now be found in the care plan section) Acute Rehab PT Goals Patient Stated Goal: Home  PT Goal Formulation:  With patient Time For Goal Achievement: 10/04/12 Potential to Achieve Goals: Good  Visit Information  Last PT Received On: 09/27/12 Assistance Needed: +1    Subjective Data  Patient Stated Goal: Home    Cognition  Cognition Arousal/Alertness: Awake/alert Behavior During Therapy: WFL for tasks assessed/performed Overall Cognitive Status: Within Functional Limits for tasks assessed    Balance     End of Session PT - End of Session Equipment Utilized During Treatment: Gait belt;Left knee immobilizer Activity Tolerance: Patient tolerated treatment well Patient left: in bed;with call bell/phone within reach;with family/visitor present Nurse Communication: Mobility status   GP     [name] 09/27/2012, 3:53 PM

## 2012-09-28 MED ORDER — OXYCODONE HCL 5 MG PO TABS: 5.0000 mg | ORAL_TABLET | ORAL | Status: DC | PRN

## 2012-09-28 MED ORDER — TRAMADOL HCL 50 MG PO TABS: 50.0000 mg | ORAL_TABLET | Freq: Four times a day (QID) | ORAL | Status: AC | PRN

## 2012-09-28 NOTE — Discharge Summary (Signed)
Physician Discharge Summary   Patient ID: [name] MRN: [id number] DOB/AGE: 1935-12-08 77 y.o.  Admit date: 09/26/2012 Discharge date: 09/28/2012  Primary Diagnosis: Patellar tendon rupture, left knee  Admission Diagnoses:  Past Medical History  Diagnosis Date  . PONV (postoperative nausea and vomiting)     none recently  . Arthritis     rheumatoid   Discharge Diagnoses:   Active Problems:   * No active hospital problems. *  Estimated body mass index is 36.37 kg/(m^2) as calculated from the following:   Height as of this encounter: 5\' 4"  (1.626 m).   Weight as of this encounter: 96.163 kg (212 lb).  Procedure:  Procedure(s) (LRB): LEFT EXTENSOR MECHANISM RECONSTRUCTION/WITH GRAFT (Left)   Consults: None  HPI: The patient is a 77 year old female who has a history of multiple patellar tendon ruptures. Unfortunately, she has reruptured her tendon. She has had multiple reconstructions already. She needed an entire extensor mechanism allograft.   Laboratory Data: Hospital Outpatient Visit on 09/19/2012  Component Date Value Range Status  . WBC 09/19/2012 7.6  4.0 - 10.5 K/uL Final  . RBC 09/19/2012 3.92  3.87 - 5.11 MIL/uL Final  . Hemoglobin 09/19/2012 13.1  12.0 - 15.0 g/dL Final  . HCT 40/98/1191 39.8  36.0 - 46.0 % Final  . MCV 09/19/2012 101.5* 78.0 - 100.0 fL Final  . MCH 09/19/2012 33.4  26.0 - 34.0 pg Final  . MCHC 09/19/2012 32.9  30.0 - 36.0 g/dL Final  . RDW 47/82/9562 12.9  11.5 - 15.5 % Final  . Platelets 09/19/2012 281  150 - 400 K/uL Final       Hospital Course: Katherine GODSIL is a 77 y.o. who was admitted to [facility]. They were brought to the operating room on 09/26/2012 and underwent Procedure(s): LEFT EXTENSOR MECHANISM RECONSTRUCTION/WITH GRAFT.  Patient tolerated the procedure well and was later transferred to the recovery room and then to the orthopaedic floor for postoperative care.  They were given PO and IV analgesics for pain  control following their surgery.  They were given 24 hours of postoperative antibiotics of  Anti-infectives   Start     Dose/Rate Route Frequency Ordered Stop   09/26/12 2000  ceFAZolin (ANCEF) IVPB 1 g/50 mL premix     1 g 100 mL/hr over 30 Minutes Intravenous Every 6 hours 09/26/12 1739 09/27/12 0819   09/26/12 1130  ceFAZolin (ANCEF) IVPB 2 g/50 mL premix     2 g 100 mL/hr over 30 Minutes Intravenous On call to O.R. 09/26/12 1115 09/26/12 1356     and started on DVT prophylaxis in the form of Lovenox.   PT and OT were ordered for total joint protocol.  Discharge planning consulted to help with postop disposition and equipment needs.  Patient had a decent night on the evening of surgery.  They started to get up OOB with therapy on day one. Hemovac drain was pulled without difficulty.  Continued to work with therapy into day two.  Dressing was changed on day two and the incision was clean and dry. The patient had progressed with therapy and meeting their goals.  Incision was healing well.  Patient was seen in rounds and was ready to go home.   Discharge Medications: Prior to Admission medications   Medication Sig Start Date End Date Taking? Authorizing Provider  acetaminophen (TYLENOL) 500 MG tablet Take 1,000 mg by mouth every 6 (six) hours as needed. For headache   Yes  Historical Provider, MD  fish oil-omega-3 fatty acids 1000 MG capsule Take 1 g by mouth 2 (two) times daily.   Yes Historical Provider, MD  ibandronate (BONIVA) 150 MG tablet Take 150 mg by mouth every 30 (thirty) days. Take in the morning with a full glass of water, on an empty stomach, and do not take anything else by mouth or lie down for the next 30 min.   Yes Historical Provider, MD  Multiple Vitamin (MULTIVITAMIN WITH MINERALS) TABS tablet Take 1 tablet by mouth daily.   Yes Historical Provider, MD  naphazoline-glycerin (CLEAR EYES) 0.012-0.2 % SOLN Place 1-2 drops into both eyes every 4 (four) hours as needed (dry eyes).    Yes Historical Provider, MD  OVER THE COUNTER MEDICATION Take 1 tablet by mouth 2 (two) times daily. Algae-Cal=750mg  Calcium, 65mg  Magnesium, and 1000 units of Vitamin D   Yes Historical Provider, MD  oxymetazoline (AFRIN) 0.05 % nasal spray Place 2 sprays into the nose as needed. For congestion   Yes Historical Provider, MD  vitamin C (ASCORBIC ACID) 500 MG tablet Take 500 mg by mouth daily.    Yes Historical Provider, MD  VITAMIN D, ERGOCALCIFEROL, PO Take 10,000 Units by mouth once a week. On Wednesdays   Yes Historical Provider, MD  cyclobenzaprine (FLEXERIL) 10 MG tablet Take 10 mg by mouth 3 (three) times daily as needed for muscle spasms.    Historical Provider, MD  diphenhydrAMINE (BENADRYL) 12.5 MG chewable tablet Chew 12.5 mg by mouth every 4 (four) hours as needed. For allergies    Historical Provider, MD  estrogens, conjugated, (PREMARIN) 0.3 MG tablet Take 0.3 mg by mouth daily with breakfast.     Historical Provider, MD  folic acid (FOLVITE) 1 MG tablet Take 1 mg by mouth daily with breakfast.    Historical Provider, MD  oxyCODONE (OXY IR/ROXICODONE) 5 MG immediate release tablet Take 1-4 tablets (5-20 mg total) by mouth every 3 (three) hours as needed. 09/28/12   Morton Simson Tamala Ser, PA-C  rosuvastatin (CRESTOR) 10 MG tablet Take 10 mg by mouth at bedtime.     Historical Provider, MD  traMADol (ULTRAM) 50 MG tablet Take 1-2 tablets (50-100 mg total) by mouth every 6 (six) hours as needed. 09/28/12   Leldon Steege Tamala Ser, PA-C    Diet: Cardiac diet Activity:WBAT Follow-up:in 2 weeks Disposition - Home Discharged Condition: stable   Discharge Orders   Future Orders Complete By Expires   Call MD / Call 911  As directed    Comments:     If you experience chest pain or shortness of breath, CALL 911 and be transported to the hospital emergency room.  If you develope a fever above 101 F, pus (white drainage) or increased drainage or redness at the wound, or calf pain, call your  surgeon's office.   Change dressing  As directed    Comments:     Change dressing daily with sterile 4 x 4 inch gauze dressing and apply TED hose.   Constipation Prevention  As directed    Comments:     Drink plenty of fluids.  Prune juice may be helpful.  You may use a stool softener, such as Colace (over the counter) 100 mg twice a day.  Use MiraLax (over the counter) for constipation as needed.   Diet - low sodium heart healthy  As directed    Do not put a pillow under the knee. Place it under the heel.  As directed  Driving restrictions  As directed    Comments:     No driving f   Increase activity slowly as tolerated  As directed   Take a 325 mg aspirin daily     Medication List    STOP taking these medications       HYDROcodone-acetaminophen 5-325 MG per tablet  Commonly known as:  NORCO/VICODIN     methotrexate 2.5 MG tablet  Commonly known as:  RHEUMATREX     predniSONE 5 MG tablet  Commonly known as:  DELTASONE      TAKE these medications       acetaminophen 500 MG tablet  Commonly known as:  TYLENOL  Take 1,000 mg by mouth every 6 (six) hours as needed. For headache     cyclobenzaprine 10 MG tablet  Commonly known as:  FLEXERIL  Take 10 mg by mouth 3 (three) times daily as needed for muscle spasms.     diphenhydrAMINE 12.5 MG chewable tablet  Commonly known as:  BENADRYL  Chew 12.5 mg by mouth every 4 (four) hours as needed. For allergies     estrogens (conjugated) 0.3 MG tablet  Commonly known as:  PREMARIN  Take 0.3 mg by mouth daily with breakfast.     fish oil-omega-3 fatty acids 1000 MG capsule  Take 1 g by mouth 2 (two) times daily.     folic acid 1 MG tablet  Commonly known as:  FOLVITE  Take 1 mg by mouth daily with breakfast.     ibandronate 150 MG tablet  Commonly known as:  BONIVA  Take 150 mg by mouth every 30 (thirty) days. Take in the morning with a full glass of water, on an empty stomach, and do not take anything else by mouth or  lie down for the next 30 min.     multivitamin with minerals Tabs tablet  Take 1 tablet by mouth daily.     naphazoline-glycerin 0.012-0.2 % Soln  Commonly known as:  CLEAR EYES  Place 1-2 drops into both eyes every 4 (four) hours as needed (dry eyes).     OVER THE COUNTER MEDICATION  Take 1 tablet by mouth 2 (two) times daily. Algae-Cal=750mg  Calcium, 65mg  Magnesium, and 1000 units of Vitamin D     oxyCODONE 5 MG immediate release tablet  Commonly known as:  Oxy IR/ROXICODONE  Take 1-4 tablets (5-20 mg total) by mouth every 3 (three) hours as needed.     oxymetazoline 0.05 % nasal spray  Commonly known as:  AFRIN  Place 2 sprays into the nose as needed. For congestion     rosuvastatin 10 MG tablet  Commonly known as:  CRESTOR  Take 10 mg by mouth at bedtime.     traMADol 50 MG tablet  Commonly known as:  ULTRAM  Take 1-2 tablets (50-100 mg total) by mouth every 6 (six) hours as needed.     vitamin C 500 MG tablet  Commonly known as:  ASCORBIC ACID  Take 500 mg by mouth daily.     VITAMIN D (ERGOCALCIFEROL) PO  Take 10,000 Units by mouth once a week. On Wednesdays           Follow-up Information   Follow up with Loanne Drilling, MD. Schedule an appointment as soon as possible for a visit on 10/09/2012. (Call [phone] Monday to make the appointment)    Specialty:  Orthopedic Surgery   Contact information:   [address] [place] [address] [phone]  Signed: Philbert Ocallaghan LAUREN 09/28/2012, 7:59 AM

## 2012-09-28 NOTE — Progress Notes (Signed)
Physical Therapy Treatment Patient Details Name: [name] MRN: [id number] DOB: 1936/01/02 Today's Date: 09/28/2012 Time: 1324-4010 PT Time Calculation (min): 31 min  PT Assessment / Plan / Recommendation  History of Present Illness     PT Comments     Follow Up Recommendations  Home health PT     Does the patient have the potential to tolerate intense rehabilitation     Barriers to Discharge        Equipment Recommendations  None recommended by PT    Recommendations for Other Services    Frequency 7X/week   Progress towards PT Goals Progress towards PT goals: Progressing toward goals  Plan Current plan remains appropriate    Precautions / Restrictions Precautions Precautions: Fall Required Braces or Orthoses: Knee Immobilizer - Left Knee Immobilizer - Left: On at all times Restrictions Weight Bearing Restrictions: No Other Position/Activity Restrictions: WBAT   Pertinent Vitals/Pain 4/10; premed, cold packs provided    Mobility  Bed Mobility Bed Mobility: Sit to Supine Sit to Supine: 4: Min assist Details for Bed Mobility Assistance: cues for sequence and use of R LE to self assist Transfers Transfers: Sit to Stand;Stand to Sit Sit to Stand: 4: Min assist;From chair/3-in-1 Stand to Sit: 4: Min assist;To bed Details for Transfer Assistance: cues for LE management and use of UEs to self assist Ambulation/Gait Ambulation/Gait Assistance: 4: Min guard Ambulation Distance (Feet): 280 Feet Assistive device: Rolling walker Ambulation/Gait Assistance Details: cues for posture and position from RW Gait Pattern: Step-to pattern;Step-through pattern Stairs: No    Exercises     PT Diagnosis:    PT Problem List:   PT Treatment Interventions:     PT Goals (current goals can now be found in the care plan section) Acute Rehab PT Goals Patient Stated Goal: Home  PT Goal Formulation: With patient Time For Goal Achievement: 10/04/12 Potential to Achieve Goals:  Good  Visit Information  Last PT Received On: 09/28/12 Assistance Needed: +1    Subjective Data  Patient Stated Goal: Home    Cognition  Cognition Arousal/Alertness: Awake/alert Behavior During Therapy: WFL for tasks assessed/performed Overall Cognitive Status: Within Functional Limits for tasks assessed    Balance     End of Session PT - End of Session Equipment Utilized During Treatment: Left knee immobilizer Activity Tolerance: Patient tolerated treatment well Patient left: in bed;with call bell/phone within reach;with family/visitor present Nurse Communication: Mobility status   GP     [name] 09/28/2012, 12:18 PM

## 2012-11-21 DIAGNOSIS — N390 Urinary tract infection, site not specified: Secondary | ICD-10-CM | POA: Diagnosis not present

## 2012-11-28 DIAGNOSIS — M069 Rheumatoid arthritis, unspecified: Secondary | ICD-10-CM | POA: Diagnosis not present

## 2012-11-28 DIAGNOSIS — Z79899 Other long term (current) drug therapy: Secondary | ICD-10-CM | POA: Diagnosis not present

## 2012-11-28 DIAGNOSIS — M25569 Pain in unspecified knee: Secondary | ICD-10-CM | POA: Diagnosis not present

## 2012-12-05 DIAGNOSIS — C44319 Basal cell carcinoma of skin of other parts of face: Secondary | ICD-10-CM | POA: Diagnosis not present

## 2012-12-11 DIAGNOSIS — Z79899 Other long term (current) drug therapy: Secondary | ICD-10-CM | POA: Diagnosis not present

## 2012-12-11 DIAGNOSIS — M069 Rheumatoid arthritis, unspecified: Secondary | ICD-10-CM | POA: Diagnosis not present

## 2013-01-11 DIAGNOSIS — S838X9A Sprain of other specified parts of unspecified knee, initial encounter: Secondary | ICD-10-CM | POA: Diagnosis not present

## 2013-02-12 DIAGNOSIS — S838X9A Sprain of other specified parts of unspecified knee, initial encounter: Secondary | ICD-10-CM | POA: Diagnosis not present

## 2013-02-20 DIAGNOSIS — M6281 Muscle weakness (generalized): Secondary | ICD-10-CM | POA: Diagnosis not present

## 2013-02-20 DIAGNOSIS — S838X9A Sprain of other specified parts of unspecified knee, initial encounter: Secondary | ICD-10-CM | POA: Diagnosis not present

## 2013-02-20 DIAGNOSIS — IMO0001 Reserved for inherently not codable concepts without codable children: Secondary | ICD-10-CM | POA: Diagnosis not present

## 2013-02-20 DIAGNOSIS — S86819A Strain of other muscle(s) and tendon(s) at lower leg level, unspecified leg, initial encounter: Secondary | ICD-10-CM | POA: Diagnosis not present

## 2013-02-20 DIAGNOSIS — M069 Rheumatoid arthritis, unspecified: Secondary | ICD-10-CM | POA: Diagnosis not present

## 2013-02-20 DIAGNOSIS — R269 Unspecified abnormalities of gait and mobility: Secondary | ICD-10-CM | POA: Diagnosis not present

## 2013-03-05 DIAGNOSIS — Z79899 Other long term (current) drug therapy: Secondary | ICD-10-CM | POA: Diagnosis not present

## 2013-03-05 DIAGNOSIS — M069 Rheumatoid arthritis, unspecified: Secondary | ICD-10-CM | POA: Diagnosis not present

## 2013-03-07 DIAGNOSIS — E785 Hyperlipidemia, unspecified: Secondary | ICD-10-CM | POA: Diagnosis not present

## 2013-03-07 DIAGNOSIS — R030 Elevated blood-pressure reading, without diagnosis of hypertension: Secondary | ICD-10-CM | POA: Diagnosis not present

## 2013-03-07 DIAGNOSIS — Z6837 Body mass index (BMI) 37.0-37.9, adult: Secondary | ICD-10-CM | POA: Diagnosis not present

## 2013-03-07 DIAGNOSIS — M069 Rheumatoid arthritis, unspecified: Secondary | ICD-10-CM | POA: Diagnosis not present

## 2013-04-03 DIAGNOSIS — M069 Rheumatoid arthritis, unspecified: Secondary | ICD-10-CM | POA: Diagnosis not present

## 2013-04-03 DIAGNOSIS — Z79899 Other long term (current) drug therapy: Secondary | ICD-10-CM | POA: Diagnosis not present

## 2013-04-03 DIAGNOSIS — M25569 Pain in unspecified knee: Secondary | ICD-10-CM | POA: Diagnosis not present

## 2013-04-18 DIAGNOSIS — S86819A Strain of other muscle(s) and tendon(s) at lower leg level, unspecified leg, initial encounter: Secondary | ICD-10-CM | POA: Diagnosis not present

## 2013-04-18 DIAGNOSIS — S838X9A Sprain of other specified parts of unspecified knee, initial encounter: Secondary | ICD-10-CM | POA: Diagnosis not present

## 2013-04-21 DIAGNOSIS — R269 Unspecified abnormalities of gait and mobility: Secondary | ICD-10-CM | POA: Diagnosis not present

## 2013-04-21 DIAGNOSIS — M6281 Muscle weakness (generalized): Secondary | ICD-10-CM | POA: Diagnosis not present

## 2013-04-21 DIAGNOSIS — M069 Rheumatoid arthritis, unspecified: Secondary | ICD-10-CM | POA: Diagnosis not present

## 2013-04-21 DIAGNOSIS — IMO0001 Reserved for inherently not codable concepts without codable children: Secondary | ICD-10-CM | POA: Diagnosis not present

## 2013-04-22 DIAGNOSIS — M6281 Muscle weakness (generalized): Secondary | ICD-10-CM | POA: Diagnosis not present

## 2013-04-22 DIAGNOSIS — R269 Unspecified abnormalities of gait and mobility: Secondary | ICD-10-CM | POA: Diagnosis not present

## 2013-04-22 DIAGNOSIS — M069 Rheumatoid arthritis, unspecified: Secondary | ICD-10-CM | POA: Diagnosis not present

## 2013-04-22 DIAGNOSIS — IMO0001 Reserved for inherently not codable concepts without codable children: Secondary | ICD-10-CM | POA: Diagnosis not present

## 2013-04-25 DIAGNOSIS — R269 Unspecified abnormalities of gait and mobility: Secondary | ICD-10-CM | POA: Diagnosis not present

## 2013-04-25 DIAGNOSIS — M069 Rheumatoid arthritis, unspecified: Secondary | ICD-10-CM | POA: Diagnosis not present

## 2013-04-25 DIAGNOSIS — M6281 Muscle weakness (generalized): Secondary | ICD-10-CM | POA: Diagnosis not present

## 2013-04-25 DIAGNOSIS — IMO0001 Reserved for inherently not codable concepts without codable children: Secondary | ICD-10-CM | POA: Diagnosis not present

## 2013-04-30 DIAGNOSIS — IMO0001 Reserved for inherently not codable concepts without codable children: Secondary | ICD-10-CM | POA: Diagnosis not present

## 2013-04-30 DIAGNOSIS — M6281 Muscle weakness (generalized): Secondary | ICD-10-CM | POA: Diagnosis not present

## 2013-04-30 DIAGNOSIS — M069 Rheumatoid arthritis, unspecified: Secondary | ICD-10-CM | POA: Diagnosis not present

## 2013-04-30 DIAGNOSIS — R269 Unspecified abnormalities of gait and mobility: Secondary | ICD-10-CM | POA: Diagnosis not present

## 2013-05-02 DIAGNOSIS — M069 Rheumatoid arthritis, unspecified: Secondary | ICD-10-CM | POA: Diagnosis not present

## 2013-05-02 DIAGNOSIS — M6281 Muscle weakness (generalized): Secondary | ICD-10-CM | POA: Diagnosis not present

## 2013-05-02 DIAGNOSIS — IMO0001 Reserved for inherently not codable concepts without codable children: Secondary | ICD-10-CM | POA: Diagnosis not present

## 2013-05-02 DIAGNOSIS — R269 Unspecified abnormalities of gait and mobility: Secondary | ICD-10-CM | POA: Diagnosis not present

## 2013-05-06 DIAGNOSIS — R269 Unspecified abnormalities of gait and mobility: Secondary | ICD-10-CM | POA: Diagnosis not present

## 2013-05-06 DIAGNOSIS — M6281 Muscle weakness (generalized): Secondary | ICD-10-CM | POA: Diagnosis not present

## 2013-05-06 DIAGNOSIS — M069 Rheumatoid arthritis, unspecified: Secondary | ICD-10-CM | POA: Diagnosis not present

## 2013-05-06 DIAGNOSIS — IMO0001 Reserved for inherently not codable concepts without codable children: Secondary | ICD-10-CM | POA: Diagnosis not present

## 2013-05-08 DIAGNOSIS — R269 Unspecified abnormalities of gait and mobility: Secondary | ICD-10-CM | POA: Diagnosis not present

## 2013-05-08 DIAGNOSIS — M069 Rheumatoid arthritis, unspecified: Secondary | ICD-10-CM | POA: Diagnosis not present

## 2013-05-08 DIAGNOSIS — M6281 Muscle weakness (generalized): Secondary | ICD-10-CM | POA: Diagnosis not present

## 2013-05-08 DIAGNOSIS — IMO0001 Reserved for inherently not codable concepts without codable children: Secondary | ICD-10-CM | POA: Diagnosis not present

## 2013-05-13 DIAGNOSIS — R269 Unspecified abnormalities of gait and mobility: Secondary | ICD-10-CM | POA: Diagnosis not present

## 2013-05-13 DIAGNOSIS — M069 Rheumatoid arthritis, unspecified: Secondary | ICD-10-CM | POA: Diagnosis not present

## 2013-05-13 DIAGNOSIS — M6281 Muscle weakness (generalized): Secondary | ICD-10-CM | POA: Diagnosis not present

## 2013-05-13 DIAGNOSIS — IMO0001 Reserved for inherently not codable concepts without codable children: Secondary | ICD-10-CM | POA: Diagnosis not present

## 2013-05-15 DIAGNOSIS — M6281 Muscle weakness (generalized): Secondary | ICD-10-CM | POA: Diagnosis not present

## 2013-05-15 DIAGNOSIS — R269 Unspecified abnormalities of gait and mobility: Secondary | ICD-10-CM | POA: Diagnosis not present

## 2013-05-15 DIAGNOSIS — M069 Rheumatoid arthritis, unspecified: Secondary | ICD-10-CM | POA: Diagnosis not present

## 2013-05-15 DIAGNOSIS — IMO0001 Reserved for inherently not codable concepts without codable children: Secondary | ICD-10-CM | POA: Diagnosis not present

## 2013-05-30 DIAGNOSIS — Z96659 Presence of unspecified artificial knee joint: Secondary | ICD-10-CM | POA: Diagnosis not present

## 2013-05-30 DIAGNOSIS — Z4889 Encounter for other specified surgical aftercare: Secondary | ICD-10-CM | POA: Diagnosis not present

## 2013-05-30 DIAGNOSIS — S838X9A Sprain of other specified parts of unspecified knee, initial encounter: Secondary | ICD-10-CM | POA: Diagnosis not present

## 2013-05-30 DIAGNOSIS — S86819A Strain of other muscle(s) and tendon(s) at lower leg level, unspecified leg, initial encounter: Secondary | ICD-10-CM | POA: Diagnosis not present

## 2013-06-04 DIAGNOSIS — M069 Rheumatoid arthritis, unspecified: Secondary | ICD-10-CM | POA: Diagnosis not present

## 2013-06-04 DIAGNOSIS — Z79899 Other long term (current) drug therapy: Secondary | ICD-10-CM | POA: Diagnosis not present

## 2013-07-18 DIAGNOSIS — Z79899 Other long term (current) drug therapy: Secondary | ICD-10-CM | POA: Diagnosis not present

## 2013-07-18 DIAGNOSIS — M25569 Pain in unspecified knee: Secondary | ICD-10-CM | POA: Diagnosis not present

## 2013-07-18 DIAGNOSIS — M069 Rheumatoid arthritis, unspecified: Secondary | ICD-10-CM | POA: Diagnosis not present

## 2013-07-18 DIAGNOSIS — M25549 Pain in joints of unspecified hand: Secondary | ICD-10-CM | POA: Diagnosis not present

## 2013-07-25 DIAGNOSIS — M069 Rheumatoid arthritis, unspecified: Secondary | ICD-10-CM | POA: Diagnosis not present

## 2013-07-25 DIAGNOSIS — Z79899 Other long term (current) drug therapy: Secondary | ICD-10-CM | POA: Diagnosis not present

## 2013-07-30 DIAGNOSIS — Z96659 Presence of unspecified artificial knee joint: Secondary | ICD-10-CM | POA: Diagnosis not present

## 2013-07-30 DIAGNOSIS — Z4789 Encounter for other orthopedic aftercare: Secondary | ICD-10-CM | POA: Diagnosis not present

## 2013-08-13 DIAGNOSIS — B372 Candidiasis of skin and nail: Secondary | ICD-10-CM | POA: Diagnosis not present

## 2013-08-13 DIAGNOSIS — Z79899 Other long term (current) drug therapy: Secondary | ICD-10-CM | POA: Diagnosis not present

## 2013-08-13 DIAGNOSIS — M25549 Pain in joints of unspecified hand: Secondary | ICD-10-CM | POA: Diagnosis not present

## 2013-08-13 DIAGNOSIS — M069 Rheumatoid arthritis, unspecified: Secondary | ICD-10-CM | POA: Diagnosis not present

## 2013-09-05 ENCOUNTER — Other Ambulatory Visit (HOSPITAL_COMMUNITY): Payer: Self-pay | Admitting: Rheumatology

## 2013-09-05 ENCOUNTER — Encounter (HOSPITAL_COMMUNITY): Payer: Self-pay

## 2013-09-05 ENCOUNTER — Encounter (HOSPITAL_COMMUNITY)
Admission: RE | Admit: 2013-09-05 | Discharge: 2013-09-05 | Disposition: A | Discharge status: Home / Self Care | Payer: Medicare Other | Source: Ambulatory Visit | Attending: Rheumatology | Admitting: Rheumatology

## 2013-09-05 DIAGNOSIS — M069 Rheumatoid arthritis, unspecified: Secondary | ICD-10-CM | POA: Insufficient documentation

## 2013-09-05 MED ORDER — TOCILIZUMAB 400 MG/20ML IV SOLN
400.0000 mg | INTRAVENOUS | Status: DC
  Administered 2013-09-05: 400 mg via INTRAVENOUS
  Filled 2013-09-05: qty 20

## 2013-09-05 MED ORDER — DIPHENHYDRAMINE HCL 50 MG/ML IJ SOLN
50.0000 mg | INTRAMUSCULAR | Status: AC
  Administered 2013-09-05 (×2): 25 mg via INTRAVENOUS
  Filled 2013-09-05: qty 1

## 2013-09-05 MED ORDER — SODIUM CHLORIDE 0.9 % IV SOLN
INTRAVENOUS | Status: DC
  Administered 2013-09-05: 250 mL via INTRAVENOUS

## 2013-09-05 NOTE — Discharge Instructions (Signed)
Tocilizumab injection What is this medicine? TOCILIZUMAB (TOE si LIZ ue mab) is used to treat rheumatoid arthritis (RA) and certain types of arthritis in children. This medicine helps reduce joint pain and swelling. This medicine is often used with other medicines. This medicine may be used for other purposes; ask your health care provider or pharmacist if you have questions. COMMON BRAND NAME(S): Actemra What should I tell my health care provider before I take this medicine? They need to know if you have any of these conditions: -cancer -diabetes -diverticulitis -heart disease -hepatitis B or history of hepatitis B infection -high blood pressure -high cholesterol -history of pancreatitis -immune system problems -infection (especially a virus infection such as chickenpox, cold sores, or herpes) -liver disease -low blood counts, like low white cell, platelet, or red cell counts -multiple sclerosis -recently received or scheduled to receive a vaccine -scheduled to have surgery -stomach ulcer -stroke -tuberculosis, a positive skin test for tuberculosis or have recently been in close contact with someone who has tuberculosis -an unusual or allergic reaction to tocilizumab, other medicines, foods, dyes, or preservatives -pregnant or trying to get pregnant -breast-feeding How should I use this medicine? This medicine is for infusion into a vein or for injection under the skin. It is usually given by a health care professional in a hospital or clinic setting. If you get this medicine at home, you will be taught how to prepare and give this medicine. Use exactly as directed. Take your medicine at regular intervals. Do not take your medicine more often than directed. It is important that you put your used needles and syringes in a special sharps container. Do not put them in a trash can. If you do not have a sharps container, call your pharmacist or healthcare provider to get one. A special  MedGuide will be given to you by the pharmacist with each prescription and refill. Be sure to read this information carefully each time. Talk to your pediatrician regarding the use of this medicine in children. While the drug may be prescribed for children as young as 2 years for selected conditions, precautions do apply. Overdosage: If you think you've taken too much of this medicine contact a poison control center or emergency room at once. Overdosage: If you think you have taken too much of this medicine contact a poison control center or emergency room at once. NOTE: This medicine is only for you. Do not share this medicine with others. What if I miss a dose? It is important not to miss your dose. Call your doctor or health care professional if you are unable to keep an appointment. If you give yourself the medicine and you miss a dose, take it as soon as you can. If it is almost time for your next dose, take only that dose. Do not take double or extra doses. What may interact with this medicine? Do not take this medicine with any of the following medications: -live virus vaccines This medicine may also interact with the following medications: -abatacept -adalimumab -anakinra -atorvastatin -certolizumab -cyclosporine -dextromethorphan -etanercept -golimumab -infliximab -lovastatin -medicines that lower the immune system -ofatumumab -omeprazole -oral contraceptives -rituximab -simvastatin -steroid medicines like prednisone or cortisone -theophylline -tositumomab -vaccines -warfarin This list may not describe all possible interactions. Give your health care provider a list of all the medicines, herbs, non-prescription drugs, or dietary supplements you use. Also tell them if you smoke, drink alcohol, or use illegal drugs. Some items may interact with your medicine. What should I  watch for while using this medicine? Tell your doctor or healthcare professional if your symptoms do  not start to get better or if they get worse. Your condition will be monitored carefully while you are receiving this medicine. You will be tested for tuberculosis (TB) before you start this medicine. If your doctor prescribes any medicine for TB, you should start taking the TB medicine before starting this medicine. Make sure to finish the full course of TB medicine. Talk to your doctor about your risk of cancer. You may be more at risk for certain types of cancers if you take this medicine. Call your doctor or health care professional for advice if you get a fever, chills or sore throat, or other symptoms of a cold or flu. Do not treat yourself. This drug decreases your body's ability to fight infections. Try to avoid being around people who are sick. You may need blood work done while you are taking this medicine. What side effects may I notice from receiving this medicine? Side effects that you should report to your doctor or health care professional as soon as possible: -allergic reactions like skin rash, itching or hives, swelling of the face, lips, or tongue -breathing problems -feeling faint or lightheaded, falls -fever or chills, sore throat -stomach pain -unusual bleeding or bruising -yellowing of the eyes or skin Side effects that usually do not require medical attention (Report these to your doctor or health care professional if they continue or are bothersome.): -dizziness -headache -pain, redness, or irritation at site where injected This list may not describe all possible side effects. Call your doctor for medical advice about side effects. You may report side effects to FDA at 1-800-FDA-1088. Where should I keep my medicine? Keep out of the reach of children. If you are using this medicine at home, you will be instructed on how to store this medicine. Throw away any unused medicine after the expiration date on the label. NOTE: This sheet is a summary. It may not cover all  possible information. If you have questions about this medicine, talk to your doctor, pharmacist, or health care provider.  2015, [employer]. (2011-12-02 13:59:59)

## 2013-09-05 NOTE — Progress Notes (Signed)
Uneventful 1st infusion of ACTEMRA. Pt was discharged accompanied by friend to main lobby for Park parking

## 2013-09-16 DIAGNOSIS — Z79899 Other long term (current) drug therapy: Secondary | ICD-10-CM | POA: Diagnosis not present

## 2013-09-16 DIAGNOSIS — E785 Hyperlipidemia, unspecified: Secondary | ICD-10-CM | POA: Diagnosis not present

## 2013-09-23 DIAGNOSIS — M069 Rheumatoid arthritis, unspecified: Secondary | ICD-10-CM | POA: Diagnosis not present

## 2013-09-23 DIAGNOSIS — E785 Hyperlipidemia, unspecified: Secondary | ICD-10-CM | POA: Diagnosis not present

## 2013-09-23 DIAGNOSIS — Z6834 Body mass index (BMI) 34.0-34.9, adult: Secondary | ICD-10-CM | POA: Diagnosis not present

## 2013-09-23 DIAGNOSIS — Z0000 Encounter for general adult medical examination without abnormal findings: Secondary | ICD-10-CM | POA: Diagnosis not present

## 2013-09-23 DIAGNOSIS — R011 Cardiac murmur, unspecified: Secondary | ICD-10-CM | POA: Diagnosis not present

## 2013-09-23 DIAGNOSIS — E669 Obesity, unspecified: Secondary | ICD-10-CM | POA: Diagnosis not present

## 2013-09-23 DIAGNOSIS — Z79899 Other long term (current) drug therapy: Secondary | ICD-10-CM | POA: Diagnosis not present

## 2013-09-23 DIAGNOSIS — Z1331 Encounter for screening for depression: Secondary | ICD-10-CM | POA: Diagnosis not present

## 2013-09-23 DIAGNOSIS — Z124 Encounter for screening for malignant neoplasm of cervix: Secondary | ICD-10-CM | POA: Diagnosis not present

## 2013-09-24 DIAGNOSIS — Z1212 Encounter for screening for malignant neoplasm of rectum: Secondary | ICD-10-CM | POA: Diagnosis not present

## 2013-10-01 DIAGNOSIS — L821 Other seborrheic keratosis: Secondary | ICD-10-CM | POA: Diagnosis not present

## 2013-10-01 DIAGNOSIS — Z85828 Personal history of other malignant neoplasm of skin: Secondary | ICD-10-CM | POA: Diagnosis not present

## 2013-10-03 ENCOUNTER — Encounter (HOSPITAL_COMMUNITY)
Admission: RE | Admit: 2013-10-03 | Discharge: 2013-10-03 | Disposition: A | Discharge status: Home / Self Care | Payer: Medicare Other | Source: Ambulatory Visit | Attending: Rheumatology | Admitting: Rheumatology

## 2013-10-03 DIAGNOSIS — M069 Rheumatoid arthritis, unspecified: Secondary | ICD-10-CM | POA: Diagnosis not present

## 2013-10-03 MED ORDER — ACETAMINOPHEN 500 MG PO TABS: 1000.0000 mg | ORAL_TABLET | Freq: Once | ORAL | Status: DC | PRN

## 2013-10-03 MED ORDER — TOCILIZUMAB 400 MG/20ML IV SOLN
400.0000 mg | INTRAVENOUS | Status: DC
  Administered 2013-10-03: 400 mg via INTRAVENOUS
  Filled 2013-10-03: qty 20

## 2013-10-03 MED ORDER — DIPHENHYDRAMINE HCL 50 MG/ML IJ SOLN
50.0000 mg | INTRAMUSCULAR | Status: DC
  Administered 2013-10-03: 50 mg via INTRAVENOUS
  Filled 2013-10-03: qty 1

## 2013-10-03 MED ORDER — SODIUM CHLORIDE 0.9 % IV SOLN
INTRAVENOUS | Status: DC
  Administered 2013-10-03: 13:00:00 via INTRAVENOUS

## 2013-10-10 DIAGNOSIS — L259 Unspecified contact dermatitis, unspecified cause: Secondary | ICD-10-CM | POA: Diagnosis not present

## 2013-10-10 DIAGNOSIS — M069 Rheumatoid arthritis, unspecified: Secondary | ICD-10-CM | POA: Diagnosis not present

## 2013-10-22 DIAGNOSIS — Z96659 Presence of unspecified artificial knee joint: Secondary | ICD-10-CM | POA: Diagnosis not present

## 2013-10-22 DIAGNOSIS — H04129 Dry eye syndrome of unspecified lacrimal gland: Secondary | ICD-10-CM | POA: Diagnosis not present

## 2013-10-22 DIAGNOSIS — H3530 Unspecified macular degeneration: Secondary | ICD-10-CM | POA: Diagnosis not present

## 2013-10-22 DIAGNOSIS — H2510 Age-related nuclear cataract, unspecified eye: Secondary | ICD-10-CM | POA: Diagnosis not present

## 2013-10-22 DIAGNOSIS — Z5189 Encounter for other specified aftercare: Secondary | ICD-10-CM | POA: Diagnosis not present

## 2013-10-31 ENCOUNTER — Encounter (HOSPITAL_COMMUNITY)
Admission: RE | Admit: 2013-10-31 | Discharge: 2013-10-31 | Disposition: A | Discharge status: Home / Self Care | Payer: Medicare Other | Source: Ambulatory Visit | Attending: Rheumatology | Admitting: Rheumatology

## 2013-10-31 ENCOUNTER — Encounter (HOSPITAL_COMMUNITY): Payer: Self-pay

## 2013-10-31 DIAGNOSIS — M069 Rheumatoid arthritis, unspecified: Secondary | ICD-10-CM | POA: Insufficient documentation

## 2013-10-31 MED ORDER — TOCILIZUMAB 400 MG/20ML IV SOLN
400.0000 mg | INTRAVENOUS | Status: AC
  Administered 2013-10-31: 400 mg via INTRAVENOUS
  Filled 2013-10-31: qty 20

## 2013-10-31 MED ORDER — ACETAMINOPHEN 500 MG PO TABS
1000.0000 mg | ORAL_TABLET | Freq: Once | ORAL | Status: AC | PRN
  Filled 2013-10-31: qty 2

## 2013-10-31 MED ORDER — DIPHENHYDRAMINE HCL 50 MG/ML IJ SOLN
50.0000 mg | INTRAMUSCULAR | Status: DC
Start: 2013-11-02 — End: 2013-11-01

## 2013-10-31 MED ORDER — DIPHENHYDRAMINE HCL 50 MG/ML IJ SOLN
INTRAMUSCULAR | Status: AC
  Administered 2013-10-31: 50 mg
  Filled 2013-10-31: qty 1

## 2013-10-31 MED ORDER — SODIUM CHLORIDE 0.9 % IV SOLN
INTRAVENOUS | Status: DC
  Administered 2013-10-31: 250 mL via INTRAVENOUS

## 2013-11-26 DIAGNOSIS — L570 Actinic keratosis: Secondary | ICD-10-CM | POA: Diagnosis not present

## 2013-11-26 DIAGNOSIS — Z23 Encounter for immunization: Secondary | ICD-10-CM | POA: Diagnosis not present

## 2013-12-02 ENCOUNTER — Encounter (HOSPITAL_COMMUNITY): Payer: Self-pay

## 2013-12-02 ENCOUNTER — Encounter (HOSPITAL_COMMUNITY)
Admission: RE | Admit: 2013-12-02 | Discharge: 2013-12-02 | Disposition: A | Discharge status: Home / Self Care | Payer: Medicare Other | Source: Ambulatory Visit | Attending: Rheumatology | Admitting: Rheumatology

## 2013-12-02 DIAGNOSIS — M069 Rheumatoid arthritis, unspecified: Secondary | ICD-10-CM | POA: Insufficient documentation

## 2013-12-02 MED ORDER — SODIUM CHLORIDE 0.9 % IV SOLN
Freq: Once | INTRAVENOUS | Status: AC
  Administered 2013-12-02: 12:00:00 via INTRAVENOUS

## 2013-12-02 MED ORDER — ACETAMINOPHEN 500 MG PO TABS: 1000.0000 mg | ORAL_TABLET | ORAL | Status: DC | PRN

## 2013-12-02 MED ORDER — TOCILIZUMAB 400 MG/20ML IV SOLN
400.0000 mg | INTRAVENOUS | Status: DC
  Administered 2013-12-02: 400 mg via INTRAVENOUS
  Filled 2013-12-02: qty 20

## 2013-12-02 MED ORDER — DIPHENHYDRAMINE HCL 25 MG PO CAPS
50.0000 mg | ORAL_CAPSULE | ORAL | Status: DC | PRN
  Filled 2013-12-02: qty 2

## 2013-12-31 DIAGNOSIS — M069 Rheumatoid arthritis, unspecified: Secondary | ICD-10-CM | POA: Diagnosis not present

## 2013-12-31 DIAGNOSIS — Z79899 Other long term (current) drug therapy: Secondary | ICD-10-CM | POA: Diagnosis not present

## 2014-01-01 ENCOUNTER — Encounter (HOSPITAL_COMMUNITY): Payer: Self-pay

## 2014-01-01 ENCOUNTER — Encounter (HOSPITAL_COMMUNITY)
Admission: RE | Admit: 2014-01-01 | Discharge: 2014-01-01 | Disposition: A | Discharge status: Home / Self Care | Payer: Medicare Other | Source: Ambulatory Visit | Attending: Rheumatology | Admitting: Rheumatology

## 2014-01-01 DIAGNOSIS — M069 Rheumatoid arthritis, unspecified: Secondary | ICD-10-CM | POA: Insufficient documentation

## 2014-01-01 MED ORDER — TOCILIZUMAB 400 MG/20ML IV SOLN
400.0000 mg | INTRAVENOUS | Status: DC
  Administered 2014-01-01: 400 mg via INTRAVENOUS
  Filled 2014-01-01: qty 20

## 2014-01-01 MED ORDER — SODIUM CHLORIDE 0.9 % IV SOLN
Freq: Once | INTRAVENOUS | Status: AC
  Administered 2014-01-01: 250 mL via INTRAVENOUS

## 2014-01-07 DIAGNOSIS — M25561 Pain in right knee: Secondary | ICD-10-CM | POA: Diagnosis not present

## 2014-01-07 DIAGNOSIS — Z7952 Long term (current) use of systemic steroids: Secondary | ICD-10-CM | POA: Diagnosis not present

## 2014-01-07 DIAGNOSIS — M069 Rheumatoid arthritis, unspecified: Secondary | ICD-10-CM | POA: Diagnosis not present

## 2014-01-07 DIAGNOSIS — M25562 Pain in left knee: Secondary | ICD-10-CM | POA: Diagnosis not present

## 2014-01-23 DIAGNOSIS — S86812D Strain of other muscle(s) and tendon(s) at lower leg level, left leg, subsequent encounter: Secondary | ICD-10-CM | POA: Diagnosis not present

## 2014-01-23 DIAGNOSIS — Z96652 Presence of left artificial knee joint: Secondary | ICD-10-CM | POA: Diagnosis not present

## 2014-02-03 ENCOUNTER — Encounter (HOSPITAL_COMMUNITY)
Admission: RE | Admit: 2014-02-03 | Discharge: 2014-02-03 | Disposition: A | Discharge status: Home / Self Care | Payer: Medicare Other | Source: Ambulatory Visit | Attending: Rheumatology | Admitting: Rheumatology

## 2014-02-03 ENCOUNTER — Encounter (HOSPITAL_COMMUNITY): Payer: Self-pay

## 2014-02-03 DIAGNOSIS — M069 Rheumatoid arthritis, unspecified: Secondary | ICD-10-CM | POA: Insufficient documentation

## 2014-02-03 MED ORDER — DIPHENHYDRAMINE HCL 25 MG PO CAPS
50.0000 mg | ORAL_CAPSULE | ORAL | Status: DC | PRN
  Administered 2014-02-03: 50 mg via ORAL
  Filled 2014-02-03: qty 2

## 2014-02-03 MED ORDER — SODIUM CHLORIDE 0.9 % IV SOLN
400.0000 mg | INTRAVENOUS | Status: AC
  Administered 2014-02-03: 400 mg via INTRAVENOUS
  Filled 2014-02-03: qty 20

## 2014-02-03 MED ORDER — SODIUM CHLORIDE 0.9 % IV SOLN
Freq: Once | INTRAVENOUS | Status: AC
  Administered 2014-02-03: 10:00:00 via INTRAVENOUS

## 2014-02-25 DIAGNOSIS — M810 Age-related osteoporosis without current pathological fracture: Secondary | ICD-10-CM | POA: Diagnosis not present

## 2014-03-06 ENCOUNTER — Encounter (HOSPITAL_COMMUNITY): Payer: Medicare Other

## 2014-03-11 ENCOUNTER — Other Ambulatory Visit (HOSPITAL_COMMUNITY): Payer: Self-pay | Admitting: Rheumatology

## 2014-03-11 ENCOUNTER — Encounter (HOSPITAL_COMMUNITY): Payer: Self-pay

## 2014-03-11 ENCOUNTER — Encounter (HOSPITAL_COMMUNITY)
Admission: RE | Admit: 2014-03-11 | Discharge: 2014-03-11 | Disposition: A | Discharge status: Home / Self Care | Payer: Medicare Other | Source: Ambulatory Visit | Attending: Rheumatology | Admitting: Rheumatology

## 2014-03-11 DIAGNOSIS — M069 Rheumatoid arthritis, unspecified: Secondary | ICD-10-CM | POA: Diagnosis not present

## 2014-03-11 MED ORDER — DIPHENHYDRAMINE HCL 50 MG/ML IJ SOLN
50.0000 mg | INTRAMUSCULAR | Status: DC | PRN
  Administered 2014-03-11: 50 mg via INTRAVENOUS
  Filled 2014-03-11: qty 1

## 2014-03-11 MED ORDER — SODIUM CHLORIDE 0.9 % IV SOLN
INTRAVENOUS | Status: DC
  Administered 2014-03-11: 250 mL via INTRAVENOUS

## 2014-03-11 MED ORDER — TOCILIZUMAB 400 MG/20ML IV SOLN
400.0000 mg | INTRAVENOUS | Status: DC
  Administered 2014-03-11: 400 mg via INTRAVENOUS
  Filled 2014-03-11: qty 20

## 2014-03-11 NOTE — Discharge Instructions (Signed)
ACTEMRA Tocilizumab injection What is this medicine? TOCILIZUMAB (TOE si LIZ ue mab) is used to treat rheumatoid arthritis (RA) and certain types of arthritis in children. This medicine helps reduce joint pain and swelling. This medicine is often used with other medicines. This medicine may be used for other purposes; ask your health care provider or pharmacist if you have questions. COMMON BRAND NAME(S): Actemra What should I tell my health care provider before I take this medicine? They need to know if you have any of these conditions: -cancer -diabetes -diverticulitis -heart disease -hepatitis B or history of hepatitis B infection -high blood pressure -high cholesterol -history of pancreatitis -immune system problems -infection (especially a virus infection such as chickenpox, cold sores, or herpes) -liver disease -low blood counts, like low white cell, platelet, or red cell counts -multiple sclerosis -recently received or scheduled to receive a vaccine -scheduled to have surgery -stomach ulcer -stroke -tuberculosis, a positive skin test for tuberculosis or have recently been in close contact with someone who has tuberculosis -an unusual or allergic reaction to tocilizumab, other medicines, foods, dyes, or preservatives -pregnant or trying to get pregnant -breast-feeding How should I use this medicine? This medicine is for infusion into a vein or for injection under the skin. It is usually given by a health care professional in a hospital or clinic setting. If you get this medicine at home, you will be taught how to prepare and give this medicine. Use exactly as directed. Take your medicine at regular intervals. Do not take your medicine more often than directed. It is important that you put your used needles and syringes in a special sharps container. Do not put them in a trash can. If you do not have a sharps container, call your pharmacist or healthcare provider to get one. A  special MedGuide will be given to you by the pharmacist with each prescription and refill. Be sure to read this information carefully each time. Talk to your pediatrician regarding the use of this medicine in children. While the drug may be prescribed for children as young as 2 years for selected conditions, precautions do apply. Overdosage: If you think you've taken too much of this medicine contact a poison control center or emergency room at once. Overdosage: If you think you have taken too much of this medicine contact a poison control center or emergency room at once. NOTE: This medicine is only for you. Do not share this medicine with others. What if I miss a dose? It is important not to miss your dose. Call your doctor or health care professional if you are unable to keep an appointment. If you give yourself the medicine and you miss a dose, take it as soon as you can. If it is almost time for your next dose, take only that dose. Do not take double or extra doses. What may interact with this medicine? Do not take this medicine with any of the following medications: -live virus vaccines This medicine may also interact with the following medications: -abatacept -adalimumab -anakinra -atorvastatin -certolizumab -cyclosporine -dextromethorphan -etanercept -golimumab -infliximab -lovastatin -medicines that lower the immune system -ofatumumab -omeprazole -oral contraceptives -rituximab -simvastatin -steroid medicines like prednisone or cortisone -theophylline -tositumomab -vaccines -warfarin This list may not describe all possible interactions. Give your health care provider a list of all the medicines, herbs, non-prescription drugs, or dietary supplements you use. Also tell them if you smoke, drink alcohol, or use illegal drugs. Some items may interact with your medicine. What should  I watch for while using this medicine? Tell your doctor or healthcare professional if your  symptoms do not start to get better or if they get worse. Your condition will be monitored carefully while you are receiving this medicine. You will be tested for tuberculosis (TB) before you start this medicine. If your doctor prescribes any medicine for TB, you should start taking the TB medicine before starting this medicine. Make sure to finish the full course of TB medicine. Talk to your doctor about your risk of cancer. You may be more at risk for certain types of cancers if you take this medicine. Call your doctor or health care professional for advice if you get a fever, chills or sore throat, or other symptoms of a cold or flu. Do not treat yourself. This drug decreases your body's ability to fight infections. Try to avoid being around people who are sick. You may need blood work done while you are taking this medicine. What side effects may I notice from receiving this medicine? Side effects that you should report to your doctor or health care professional as soon as possible: -allergic reactions like skin rash, itching or hives, swelling of the face, lips, or tongue -breathing problems -feeling faint or lightheaded, falls -fever or chills, sore throat -stomach pain -unusual bleeding or bruising -yellowing of the eyes or skin Side effects that usually do not require medical attention (Report these to your doctor or health care professional if they continue or are bothersome.): -dizziness -headache -pain, redness, or irritation at site where injected This list may not describe all possible side effects. Call your doctor for medical advice about side effects. You may report side effects to FDA at 1-800-FDA-1088. Where should I keep my medicine? Keep out of the reach of children. If you are using this medicine at home, you will be instructed on how to store this medicine. Throw away any unused medicine after the expiration date on the label. NOTE: This sheet is a summary. It may not  cover all possible information. If you have questions about this medicine, talk to your doctor, pharmacist, or health care provider.  2015, [employer]. (2011-12-02 13:59:59)

## 2014-03-28 DIAGNOSIS — M25562 Pain in left knee: Secondary | ICD-10-CM | POA: Diagnosis not present

## 2014-03-28 DIAGNOSIS — M069 Rheumatoid arthritis, unspecified: Secondary | ICD-10-CM | POA: Diagnosis not present

## 2014-03-28 DIAGNOSIS — M810 Age-related osteoporosis without current pathological fracture: Secondary | ICD-10-CM | POA: Diagnosis not present

## 2014-03-28 DIAGNOSIS — Z6836 Body mass index (BMI) 36.0-36.9, adult: Secondary | ICD-10-CM | POA: Diagnosis not present

## 2014-03-28 DIAGNOSIS — J309 Allergic rhinitis, unspecified: Secondary | ICD-10-CM | POA: Diagnosis not present

## 2014-04-02 DIAGNOSIS — Z7952 Long term (current) use of systemic steroids: Secondary | ICD-10-CM | POA: Diagnosis not present

## 2014-04-02 DIAGNOSIS — M069 Rheumatoid arthritis, unspecified: Secondary | ICD-10-CM | POA: Diagnosis not present

## 2014-04-07 ENCOUNTER — Encounter (HOSPITAL_COMMUNITY): Payer: Medicare Other

## 2014-04-16 ENCOUNTER — Encounter (HOSPITAL_COMMUNITY): Payer: Self-pay

## 2014-04-16 ENCOUNTER — Encounter (HOSPITAL_COMMUNITY)
Admission: RE | Admit: 2014-04-16 | Discharge: 2014-04-16 | Disposition: A | Discharge status: Home / Self Care | Payer: Medicare Other | Source: Ambulatory Visit | Attending: Rheumatology | Admitting: Rheumatology

## 2014-04-16 DIAGNOSIS — M069 Rheumatoid arthritis, unspecified: Secondary | ICD-10-CM | POA: Insufficient documentation

## 2014-04-16 MED ORDER — DIPHENHYDRAMINE HCL 50 MG/ML IJ SOLN
50.0000 mg | INTRAMUSCULAR | Status: DC | PRN
  Administered 2014-04-16: 50 mg via INTRAVENOUS
  Filled 2014-04-16: qty 1

## 2014-04-16 MED ORDER — SODIUM CHLORIDE 0.9 % IV SOLN
400.0000 mg | INTRAVENOUS | Status: DC
  Administered 2014-04-16: 400 mg via INTRAVENOUS
  Filled 2014-04-16: qty 20

## 2014-04-16 MED ORDER — ACETAMINOPHEN 500 MG PO TABS
1000.0000 mg | ORAL_TABLET | ORAL | Status: DC | PRN
Start: 2014-04-16 — End: 2014-04-17
  Administered 2014-04-16: 500 mg via ORAL
  Filled 2014-04-16: qty 2

## 2014-04-16 MED ORDER — SODIUM CHLORIDE 0.9 % IV SOLN
INTRAVENOUS | Status: DC
  Administered 2014-04-16: 10:00:00 via INTRAVENOUS

## 2014-04-16 NOTE — Progress Notes (Signed)
Pt arrived today at 0850, sent order for IV team consult at 0855, iv team in room to start iv at 0945.  Started pt's actemera at [phone].  The med is a 1 hour infusion.

## 2014-04-16 NOTE — Progress Notes (Signed)
Uneventful infusion of actemera.  Iv team started pt's iv site today.  Vss, afebrile.  Once infusion finished, iv site was d/c and pressure dressing applied with gauze and coban.  Pt d/c ambulatory with friend.

## 2014-04-21 DIAGNOSIS — Z6836 Body mass index (BMI) 36.0-36.9, adult: Secondary | ICD-10-CM | POA: Diagnosis not present

## 2014-04-21 DIAGNOSIS — J309 Allergic rhinitis, unspecified: Secondary | ICD-10-CM | POA: Diagnosis not present

## 2014-04-21 DIAGNOSIS — B0089 Other herpesviral infection: Secondary | ICD-10-CM | POA: Diagnosis not present

## 2014-04-21 DIAGNOSIS — B029 Zoster without complications: Secondary | ICD-10-CM | POA: Diagnosis not present

## 2014-04-21 DIAGNOSIS — J019 Acute sinusitis, unspecified: Secondary | ICD-10-CM | POA: Diagnosis not present

## 2014-04-21 HISTORY — DX: Zoster without complications: B02.9

## 2014-04-24 DIAGNOSIS — Z96652 Presence of left artificial knee joint: Secondary | ICD-10-CM | POA: Diagnosis not present

## 2014-04-24 DIAGNOSIS — S86812D Strain of other muscle(s) and tendon(s) at lower leg level, left leg, subsequent encounter: Secondary | ICD-10-CM | POA: Diagnosis not present

## 2014-04-24 DIAGNOSIS — Z471 Aftercare following joint replacement surgery: Secondary | ICD-10-CM | POA: Diagnosis not present

## 2014-05-16 ENCOUNTER — Encounter (HOSPITAL_COMMUNITY): Payer: Self-pay

## 2014-05-16 ENCOUNTER — Encounter (HOSPITAL_COMMUNITY)
Admission: RE | Admit: 2014-05-16 | Discharge: 2014-05-16 | Disposition: A | Discharge status: Home / Self Care | Payer: Medicare Other | Source: Ambulatory Visit | Attending: Rheumatology | Admitting: Rheumatology

## 2014-05-16 DIAGNOSIS — M069 Rheumatoid arthritis, unspecified: Secondary | ICD-10-CM | POA: Insufficient documentation

## 2014-05-16 HISTORY — DX: Zoster without complications: B02.9

## 2014-05-16 MED ORDER — ACETAMINOPHEN 500 MG PO TABS
1000.0000 mg | ORAL_TABLET | ORAL | Status: DC | PRN
  Administered 2014-05-16: 1000 mg via ORAL
  Filled 2014-05-16: qty 2

## 2014-05-16 MED ORDER — DIPHENHYDRAMINE HCL 50 MG/ML IJ SOLN
50.0000 mg | INTRAMUSCULAR | Status: DC | PRN
  Administered 2014-05-16: 50 mg via INTRAVENOUS
  Filled 2014-05-16: qty 1

## 2014-05-16 MED ORDER — SODIUM CHLORIDE 0.9 % IV SOLN
400.0000 mg | INTRAVENOUS | Status: AC
  Administered 2014-05-16: 400 mg via INTRAVENOUS
  Filled 2014-05-16: qty 20

## 2014-05-16 MED ORDER — SODIUM CHLORIDE 0.9 % IV SOLN
INTRAVENOUS | Status: AC
  Administered 2014-05-16: 250 mL via INTRAVENOUS

## 2014-05-29 DIAGNOSIS — M0570 Rheumatoid arthritis with rheumatoid factor of unspecified site without organ or systems involvement: Secondary | ICD-10-CM | POA: Diagnosis not present

## 2014-05-29 DIAGNOSIS — M79642 Pain in left hand: Secondary | ICD-10-CM | POA: Diagnosis not present

## 2014-05-29 DIAGNOSIS — M25562 Pain in left knee: Secondary | ICD-10-CM | POA: Diagnosis not present

## 2014-05-29 DIAGNOSIS — M79641 Pain in right hand: Secondary | ICD-10-CM | POA: Diagnosis not present

## 2014-06-13 ENCOUNTER — Other Ambulatory Visit (HOSPITAL_COMMUNITY): Payer: Self-pay | Admitting: Rheumatology

## 2014-06-16 ENCOUNTER — Encounter (HOSPITAL_COMMUNITY): Payer: Medicare Other

## 2014-07-15 DIAGNOSIS — M059 Rheumatoid arthritis with rheumatoid factor, unspecified: Secondary | ICD-10-CM | POA: Diagnosis not present

## 2014-07-15 DIAGNOSIS — Z79899 Other long term (current) drug therapy: Secondary | ICD-10-CM | POA: Diagnosis not present

## 2014-07-16 ENCOUNTER — Encounter (HOSPITAL_COMMUNITY): Payer: Self-pay

## 2014-07-16 ENCOUNTER — Encounter (HOSPITAL_COMMUNITY)
Admission: RE | Admit: 2014-07-16 | Discharge: 2014-07-16 | Disposition: A | Discharge status: Home / Self Care | Payer: Medicare Other | Source: Ambulatory Visit | Attending: Rheumatology | Admitting: Rheumatology

## 2014-07-16 DIAGNOSIS — M069 Rheumatoid arthritis, unspecified: Secondary | ICD-10-CM | POA: Insufficient documentation

## 2014-07-16 MED ORDER — SODIUM CHLORIDE 0.9 % IV SOLN
400.0000 mg | INTRAVENOUS | Status: DC
  Administered 2014-07-16: 400 mg via INTRAVENOUS
  Filled 2014-07-16: qty 20

## 2014-07-16 MED ORDER — ACETAMINOPHEN 500 MG PO TABS
1000.0000 mg | ORAL_TABLET | ORAL | Status: DC | PRN
  Administered 2014-07-16: 1000 mg via ORAL
  Filled 2014-07-16: qty 2

## 2014-07-16 MED ORDER — DIPHENHYDRAMINE HCL 50 MG/ML IJ SOLN
50.0000 mg | INTRAMUSCULAR | Status: DC | PRN
  Administered 2014-07-16: 25 mg via INTRAVENOUS
  Filled 2014-07-16: qty 1

## 2014-07-16 MED ORDER — SODIUM CHLORIDE 0.9 % IV SOLN
Freq: Once | INTRAVENOUS | Status: AC
  Administered 2014-07-16: 250 mL via INTRAVENOUS

## 2014-07-16 NOTE — Discharge Instructions (Signed)
ATTENTION:  If you are going to be 15 or more minutes late for your appointment, please call [phone] to make other arrangements for your treatment.  If you arrive early for your schedule appointment, you may have to wait until your scheduled time.Tocilizumab injection What is this medicine? TOCILIZUMAB (TOE si LIZ ue mab) is used to treat rheumatoid arthritis (RA) and certain types of arthritis in children. This medicine helps reduce joint pain and swelling. This medicine is often used with other medicines. This medicine may be used for other purposes; ask your health care provider or pharmacist if you have questions. COMMON BRAND NAME(S): Actemra What should I tell my health care provider before I take this medicine? They need to know if you have any of these conditions: -cancer -diabetes -diverticulitis -heart disease -hepatitis B or history of hepatitis B infection -high blood pressure -high cholesterol -history of pancreatitis -immune system problems -infection (especially a virus infection such as chickenpox, cold sores, or herpes) -liver disease -low blood counts, like low white cell, platelet, or red cell counts -multiple sclerosis -recently received or scheduled to receive a vaccine -scheduled to have surgery -stomach ulcer -stroke -tuberculosis, a positive skin test for tuberculosis or have recently been in close contact with someone who has tuberculosis -an unusual or allergic reaction to tocilizumab, other medicines, foods, dyes, or preservatives -pregnant or trying to get pregnant -breast-feeding How should I use this medicine? This medicine is for infusion into a vein or for injection under the skin. It is usually given by a health care professional in a hospital or clinic setting. If you get this medicine at home, you will be taught how to prepare and give this medicine. Use exactly as directed. Take your medicine at regular intervals. Do not take your medicine more  often than directed. It is important that you put your used needles and syringes in a special sharps container. Do not put them in a trash can. If you do not have a sharps container, call your pharmacist or healthcare provider to get one. A special MedGuide will be given to you by the pharmacist with each prescription and refill. Be sure to read this information carefully each time. Talk to your pediatrician regarding the use of this medicine in children. While the drug may be prescribed for children as young as 2 years for selected conditions, precautions do apply. Overdosage: If you think you've taken too much of this medicine contact a poison control center or emergency room at once. Overdosage: If you think you have taken too much of this medicine contact a poison control center or emergency room at once. NOTE: This medicine is only for you. Do not share this medicine with others. What if I miss a dose? It is important not to miss your dose. Call your doctor or health care professional if you are unable to keep an appointment. If you give yourself the medicine and you miss a dose, take it as soon as you can. If it is almost time for your next dose, take only that dose. Do not take double or extra doses. What may interact with this medicine? Do not take this medicine with any of the following medications: -live virus vaccines This medicine may also interact with the following medications: -abatacept -adalimumab -anakinra -atorvastatin -certolizumab -cyclosporine -dextromethorphan -etanercept -golimumab -infliximab -lovastatin -medicines that lower the immune system -ofatumumab -omeprazole -oral contraceptives -rituximab -simvastatin -steroid medicines like prednisone or cortisone -theophylline -tositumomab -vaccines -warfarin This list may not describe all  possible interactions. Give your health care provider a list of all the medicines, herbs, non-prescription drugs, or dietary  supplements you use. Also tell them if you smoke, drink alcohol, or use illegal drugs. Some items may interact with your medicine. What should I watch for while using this medicine? Tell your doctor or healthcare professional if your symptoms do not start to get better or if they get worse. Your condition will be monitored carefully while you are receiving this medicine. You will be tested for tuberculosis (TB) before you start this medicine. If your doctor prescribes any medicine for TB, you should start taking the TB medicine before starting this medicine. Make sure to finish the full course of TB medicine. Talk to your doctor about your risk of cancer. You may be more at risk for certain types of cancers if you take this medicine. Call your doctor or health care professional for advice if you get a fever, chills or sore throat, or other symptoms of a cold or flu. Do not treat yourself. This drug decreases your body's ability to fight infections. Try to avoid being around people who are sick. You may need blood work done while you are taking this medicine. What side effects may I notice from receiving this medicine? Side effects that you should report to your doctor or health care professional as soon as possible: -allergic reactions like skin rash, itching or hives, swelling of the face, lips, or tongue -breathing problems -feeling faint or lightheaded, falls -fever or chills, sore throat -stomach pain -unusual bleeding or bruising -yellowing of the eyes or skin Side effects that usually do not require medical attention (Report these to your doctor or health care professional if they continue or are bothersome.): -dizziness -headache -pain, redness, or irritation at site where injected This list may not describe all possible side effects. Call your doctor for medical advice about side effects. You may report side effects to FDA at 1-800-FDA-1088. Where should I keep my medicine? Keep out of  the reach of children. If you are using this medicine at home, you will be instructed on how to store this medicine. Throw away any unused medicine after the expiration date on the label. NOTE: This sheet is a summary. It may not cover all possible information. If you have questions about this medicine, talk to your doctor, pharmacist, or health care provider.  2015, [employer]. (2011-12-02 13:59:59)

## 2014-07-17 DIAGNOSIS — Z96652 Presence of left artificial knee joint: Secondary | ICD-10-CM | POA: Diagnosis not present

## 2014-07-17 DIAGNOSIS — S86812D Strain of other muscle(s) and tendon(s) at lower leg level, left leg, subsequent encounter: Secondary | ICD-10-CM | POA: Diagnosis not present

## 2014-08-13 DIAGNOSIS — M79641 Pain in right hand: Secondary | ICD-10-CM | POA: Diagnosis not present

## 2014-08-13 DIAGNOSIS — R5382 Chronic fatigue, unspecified: Secondary | ICD-10-CM | POA: Diagnosis not present

## 2014-08-13 DIAGNOSIS — M79642 Pain in left hand: Secondary | ICD-10-CM | POA: Diagnosis not present

## 2014-08-13 DIAGNOSIS — D309 Benign neoplasm of urinary organ, unspecified: Secondary | ICD-10-CM | POA: Diagnosis not present

## 2014-08-13 DIAGNOSIS — M059 Rheumatoid arthritis with rheumatoid factor, unspecified: Secondary | ICD-10-CM | POA: Diagnosis not present

## 2014-08-15 ENCOUNTER — Encounter (HOSPITAL_COMMUNITY): Admission: RE | Admit: 2014-08-15 | Payer: Medicare Other | Source: Ambulatory Visit

## 2014-09-15 ENCOUNTER — Other Ambulatory Visit (HOSPITAL_COMMUNITY): Payer: Self-pay | Admitting: Rheumatology

## 2014-09-15 ENCOUNTER — Encounter (INDEPENDENT_AMBULATORY_CARE_PROVIDER_SITE_OTHER): Payer: Self-pay

## 2014-09-15 ENCOUNTER — Encounter (HOSPITAL_COMMUNITY)
Admission: RE | Admit: 2014-09-15 | Discharge: 2014-09-15 | Disposition: A | Discharge status: Home / Self Care | Payer: Medicare Other | Source: Ambulatory Visit | Attending: Rheumatology | Admitting: Rheumatology

## 2014-09-15 ENCOUNTER — Encounter (HOSPITAL_COMMUNITY): Payer: Self-pay

## 2014-09-15 DIAGNOSIS — M069 Rheumatoid arthritis, unspecified: Secondary | ICD-10-CM | POA: Insufficient documentation

## 2014-09-15 MED ORDER — DIPHENHYDRAMINE HCL 50 MG/ML IJ SOLN
50.0000 mg | INTRAMUSCULAR | Status: DC
  Administered 2014-09-15: 50 mg via INTRAVENOUS
  Filled 2014-09-15: qty 1

## 2014-09-15 MED ORDER — TOCILIZUMAB 400 MG/20ML IV SOLN
400.0000 mg | INTRAVENOUS | Status: DC
  Administered 2014-09-15: 400 mg via INTRAVENOUS
  Filled 2014-09-15: qty 20

## 2014-09-15 MED ORDER — SODIUM CHLORIDE 0.9 % IV SOLN
INTRAVENOUS | Status: DC
  Administered 2014-09-15: 250 mL via INTRAVENOUS

## 2014-09-15 MED ORDER — ACETAMINOPHEN 500 MG PO TABS: 1000.0000 mg | ORAL_TABLET | ORAL | Status: DC

## 2014-09-24 DIAGNOSIS — D309 Benign neoplasm of urinary organ, unspecified: Secondary | ICD-10-CM | POA: Diagnosis not present

## 2014-09-24 DIAGNOSIS — M059 Rheumatoid arthritis with rheumatoid factor, unspecified: Secondary | ICD-10-CM | POA: Diagnosis not present

## 2014-09-24 DIAGNOSIS — M25562 Pain in left knee: Secondary | ICD-10-CM | POA: Diagnosis not present

## 2014-10-15 ENCOUNTER — Encounter (HOSPITAL_COMMUNITY)
Admission: RE | Admit: 2014-10-15 | Discharge: 2014-10-15 | Disposition: A | Discharge status: Home / Self Care | Payer: Medicare Other | Source: Ambulatory Visit | Attending: Rheumatology | Admitting: Rheumatology

## 2014-10-15 ENCOUNTER — Encounter (HOSPITAL_COMMUNITY): Payer: Self-pay

## 2014-10-15 DIAGNOSIS — M069 Rheumatoid arthritis, unspecified: Secondary | ICD-10-CM | POA: Diagnosis not present

## 2014-10-15 MED ORDER — TOCILIZUMAB 400 MG/20ML IV SOLN
400.0000 mg | INTRAVENOUS | Status: DC
  Administered 2014-10-15: 400 mg via INTRAVENOUS
  Filled 2014-10-15: qty 20

## 2014-10-15 MED ORDER — ACETAMINOPHEN 500 MG PO TABS: 1000.0000 mg | ORAL_TABLET | ORAL | Status: DC

## 2014-10-15 MED ORDER — DIPHENHYDRAMINE HCL 50 MG/ML IJ SOLN
50.0000 mg | INTRAMUSCULAR | Status: DC
  Administered 2014-10-15: 50 mg via INTRAVENOUS
  Filled 2014-10-15: qty 1

## 2014-10-15 MED ORDER — SODIUM CHLORIDE 0.9 % IV SOLN
INTRAVENOUS | Status: DC
  Administered 2014-10-15: 10:00:00 via INTRAVENOUS

## 2014-10-17 DIAGNOSIS — E785 Hyperlipidemia, unspecified: Secondary | ICD-10-CM | POA: Diagnosis not present

## 2014-10-17 DIAGNOSIS — M810 Age-related osteoporosis without current pathological fracture: Secondary | ICD-10-CM | POA: Diagnosis not present

## 2014-10-21 DIAGNOSIS — Z471 Aftercare following joint replacement surgery: Secondary | ICD-10-CM | POA: Diagnosis not present

## 2014-10-21 DIAGNOSIS — Z96652 Presence of left artificial knee joint: Secondary | ICD-10-CM | POA: Diagnosis not present

## 2014-10-21 DIAGNOSIS — S86812D Strain of other muscle(s) and tendon(s) at lower leg level, left leg, subsequent encounter: Secondary | ICD-10-CM | POA: Diagnosis not present

## 2014-10-23 DIAGNOSIS — Z85828 Personal history of other malignant neoplasm of skin: Secondary | ICD-10-CM | POA: Diagnosis not present

## 2014-10-23 DIAGNOSIS — L821 Other seborrheic keratosis: Secondary | ICD-10-CM | POA: Diagnosis not present

## 2014-10-23 DIAGNOSIS — Z23 Encounter for immunization: Secondary | ICD-10-CM | POA: Diagnosis not present

## 2014-10-24 DIAGNOSIS — M069 Rheumatoid arthritis, unspecified: Secondary | ICD-10-CM | POA: Diagnosis not present

## 2014-10-24 DIAGNOSIS — B0089 Other herpesviral infection: Secondary | ICD-10-CM | POA: Diagnosis not present

## 2014-10-24 DIAGNOSIS — R030 Elevated blood-pressure reading, without diagnosis of hypertension: Secondary | ICD-10-CM | POA: Diagnosis not present

## 2014-10-24 DIAGNOSIS — E669 Obesity, unspecified: Secondary | ICD-10-CM | POA: Diagnosis not present

## 2014-10-24 DIAGNOSIS — Z6836 Body mass index (BMI) 36.0-36.9, adult: Secondary | ICD-10-CM | POA: Diagnosis not present

## 2014-10-24 DIAGNOSIS — J309 Allergic rhinitis, unspecified: Secondary | ICD-10-CM | POA: Diagnosis not present

## 2014-10-24 DIAGNOSIS — R011 Cardiac murmur, unspecified: Secondary | ICD-10-CM | POA: Diagnosis not present

## 2014-10-24 DIAGNOSIS — Z1231 Encounter for screening mammogram for malignant neoplasm of breast: Secondary | ICD-10-CM | POA: Diagnosis not present

## 2014-10-24 DIAGNOSIS — E785 Hyperlipidemia, unspecified: Secondary | ICD-10-CM | POA: Diagnosis not present

## 2014-10-24 DIAGNOSIS — M25562 Pain in left knee: Secondary | ICD-10-CM | POA: Diagnosis not present

## 2014-10-24 DIAGNOSIS — M810 Age-related osteoporosis without current pathological fracture: Secondary | ICD-10-CM | POA: Diagnosis not present

## 2014-10-24 DIAGNOSIS — Z0000 Encounter for general adult medical examination without abnormal findings: Secondary | ICD-10-CM | POA: Diagnosis not present

## 2014-10-27 DIAGNOSIS — M05841 Other rheumatoid arthritis with rheumatoid factor of right hand: Secondary | ICD-10-CM | POA: Diagnosis not present

## 2014-10-27 DIAGNOSIS — M05842 Other rheumatoid arthritis with rheumatoid factor of left hand: Secondary | ICD-10-CM | POA: Diagnosis not present

## 2014-10-27 DIAGNOSIS — M20031 Swan-neck deformity of right finger(s): Secondary | ICD-10-CM | POA: Diagnosis not present

## 2014-10-27 DIAGNOSIS — M2032 Hallux varus (acquired), left foot: Secondary | ICD-10-CM | POA: Diagnosis not present

## 2014-11-03 ENCOUNTER — Other Ambulatory Visit: Payer: Self-pay | Admitting: Internal Medicine

## 2014-11-03 DIAGNOSIS — Z1231 Encounter for screening mammogram for malignant neoplasm of breast: Secondary | ICD-10-CM

## 2014-11-04 DIAGNOSIS — Z1212 Encounter for screening for malignant neoplasm of rectum: Secondary | ICD-10-CM | POA: Diagnosis not present

## 2014-11-07 DIAGNOSIS — Z0100 Encounter for examination of eyes and vision without abnormal findings: Secondary | ICD-10-CM | POA: Diagnosis not present

## 2014-11-07 DIAGNOSIS — H04123 Dry eye syndrome of bilateral lacrimal glands: Secondary | ICD-10-CM | POA: Diagnosis not present

## 2014-11-07 DIAGNOSIS — H2513 Age-related nuclear cataract, bilateral: Secondary | ICD-10-CM | POA: Diagnosis not present

## 2014-11-07 DIAGNOSIS — H3531 Nonexudative age-related macular degeneration: Secondary | ICD-10-CM | POA: Diagnosis not present

## 2014-11-12 ENCOUNTER — Ambulatory Visit
Admission: RE | Admit: 2014-11-12 | Discharge: 2014-11-12 | Disposition: A | Discharge status: Home / Self Care | Payer: Medicare Other | Source: Ambulatory Visit | Attending: Internal Medicine | Admitting: Internal Medicine

## 2014-11-12 DIAGNOSIS — Z1231 Encounter for screening mammogram for malignant neoplasm of breast: Secondary | ICD-10-CM

## 2014-11-13 ENCOUNTER — Other Ambulatory Visit: Payer: Self-pay | Admitting: Internal Medicine

## 2014-11-13 DIAGNOSIS — R928 Other abnormal and inconclusive findings on diagnostic imaging of breast: Secondary | ICD-10-CM

## 2014-11-14 ENCOUNTER — Encounter (HOSPITAL_COMMUNITY): Payer: Medicare Other

## 2014-11-18 ENCOUNTER — Encounter (HOSPITAL_COMMUNITY): Payer: Self-pay

## 2014-11-18 ENCOUNTER — Encounter (HOSPITAL_COMMUNITY)
Admission: RE | Admit: 2014-11-18 | Discharge: 2014-11-18 | Disposition: A | Discharge status: Home / Self Care | Payer: Medicare Other | Source: Ambulatory Visit | Attending: Rheumatology | Admitting: Rheumatology

## 2014-11-18 DIAGNOSIS — M069 Rheumatoid arthritis, unspecified: Secondary | ICD-10-CM | POA: Diagnosis not present

## 2014-11-18 MED ORDER — DIPHENHYDRAMINE HCL 50 MG/ML IJ SOLN
50.0000 mg | INTRAMUSCULAR | Status: DC
Start: 2014-11-18 — End: 2014-11-19
  Administered 2014-11-18: 50 mg via INTRAVENOUS
  Filled 2014-11-18: qty 1

## 2014-11-18 MED ORDER — TOCILIZUMAB 400 MG/20ML IV SOLN
400.0000 mg | INTRAVENOUS | Status: DC
  Administered 2014-11-18: 400 mg via INTRAVENOUS
  Filled 2014-11-18: qty 20

## 2014-11-18 MED ORDER — SODIUM CHLORIDE 0.9 % IV SOLN
INTRAVENOUS | Status: DC
  Administered 2014-11-18: 250 mL via INTRAVENOUS

## 2014-11-18 NOTE — Discharge Instructions (Signed)
ACTEMRA Tocilizumab injection What is this medicine? TOCILIZUMAB (TOE si LIZ ue mab) is used to treat rheumatoid arthritis (RA) and certain types of arthritis in children. This medicine helps reduce joint pain and swelling. This medicine is often used with other medicines. This medicine may be used for other purposes; ask your health care provider or pharmacist if you have questions. What should I tell my health care provider before I take this medicine? They need to know if you have any of these conditions: -cancer -diabetes -diverticulitis -heart disease -hepatitis B or history of hepatitis B infection -high blood pressure -high cholesterol -history of pancreatitis -immune system problems -infection (especially a virus infection such as chickenpox, cold sores, or herpes) -liver disease -low blood counts, like low white cell, platelet, or red cell counts -multiple sclerosis -recently received or scheduled to receive a vaccine -scheduled to have surgery -stomach ulcer -stroke -tuberculosis, a positive skin test for tuberculosis or have recently been in close contact with someone who has tuberculosis -an unusual or allergic reaction to tocilizumab, other medicines, foods, dyes, or preservatives -pregnant or trying to get pregnant -breast-feeding How should I use this medicine? This medicine is for infusion into a vein or for injection under the skin. It is usually given by a health care professional in a hospital or clinic setting. If you get this medicine at home, you will be taught how to prepare and give this medicine. Use exactly as directed. Take your medicine at regular intervals. Do not take your medicine more often than directed. It is important that you put your used needles and syringes in a special sharps container. Do not put them in a trash can. If you do not have a sharps container, call your pharmacist or healthcare provider to get one. A special MedGuide will be given to  you by the pharmacist with each prescription and refill. Be sure to read this information carefully each time. Talk to your pediatrician regarding the use of this medicine in children. While the drug may be prescribed for children as young as 2 years for selected conditions, precautions do apply. Overdosage: If you think you have taken too much of this medicine contact a poison control center or emergency room at once. NOTE: This medicine is only for you. Do not share this medicine with others. What if I miss a dose? It is important not to miss your dose. Call your doctor or health care professional if you are unable to keep an appointment. If you give yourself the medicine and you miss a dose, take it as soon as you can. If it is almost time for your next dose, take only that dose. Do not take double or extra doses. What may interact with this medicine? Do not take this medicine with any of the following medications: -live virus vaccines This medicine may also interact with the following medications: -abatacept -adalimumab -anakinra -atorvastatin -certolizumab -cyclosporine -dextromethorphan -etanercept -golimumab -infliximab -lovastatin -medicines that lower the immune system -ofatumumab -omeprazole -oral contraceptives -rituximab -simvastatin -steroid medicines like prednisone or cortisone -theophylline -tositumomab -vaccines -warfarin This list may not describe all possible interactions. Give your health care provider a list of all the medicines, herbs, non-prescription drugs, or dietary supplements you use. Also tell them if you smoke, drink alcohol, or use illegal drugs. Some items may interact with your medicine. What should I watch for while using this medicine? Tell your doctor or healthcare professional if your symptoms do not start to get better or if they  get worse. Your condition will be monitored carefully while you are receiving this medicine. You will be tested for  tuberculosis (TB) before you start this medicine. If your doctor prescribes any medicine for TB, you should start taking the TB medicine before starting this medicine. Make sure to finish the full course of TB medicine. Talk to your doctor about your risk of cancer. You may be more at risk for certain types of cancers if you take this medicine. Call your doctor or health care professional for advice if you get a fever, chills or sore throat, or other symptoms of a cold or flu. Do not treat yourself. This drug decreases your body's ability to fight infections. Try to avoid being around people who are sick. You may need blood work done while you are taking this medicine. What side effects may I notice from receiving this medicine? Side effects that you should report to your doctor or health care professional as soon as possible: -allergic reactions like skin rash, itching or hives, swelling of the face, lips, or tongue -breathing problems -feeling faint or lightheaded, falls -fever or chills, sore throat -stomach pain -unusual bleeding or bruising -yellowing of the eyes or skin Side effects that usually do not require medical attention (Report these to your doctor or health care professional if they continue or are bothersome.): -dizziness -headache -pain, redness, or irritation at site where injected This list may not describe all possible side effects. Call your doctor for medical advice about side effects. You may report side effects to FDA at 1-800-FDA-1088. Where should I keep my medicine? Keep out of the reach of children. If you are using this medicine at home, you will be instructed on how to store this medicine. Throw away any unused medicine after the expiration date on the label. NOTE: This sheet is a summary. It may not cover all possible information. If you have questions about this medicine, talk to your doctor, pharmacist, or health care provider.    2016, [employer].  (2011-12-02 13:59:59)

## 2014-11-21 ENCOUNTER — Ambulatory Visit
Admission: RE | Admit: 2014-11-21 | Discharge: 2014-11-21 | Disposition: A | Discharge status: Home / Self Care | Payer: Medicare Other | Source: Ambulatory Visit | Attending: Internal Medicine | Admitting: Internal Medicine

## 2014-11-21 DIAGNOSIS — R921 Mammographic calcification found on diagnostic imaging of breast: Secondary | ICD-10-CM | POA: Diagnosis not present

## 2014-11-21 DIAGNOSIS — R928 Other abnormal and inconclusive findings on diagnostic imaging of breast: Secondary | ICD-10-CM

## 2014-12-03 DIAGNOSIS — M05841 Other rheumatoid arthritis with rheumatoid factor of right hand: Secondary | ICD-10-CM | POA: Diagnosis not present

## 2014-12-08 DIAGNOSIS — M2032 Hallux varus (acquired), left foot: Secondary | ICD-10-CM | POA: Diagnosis not present

## 2014-12-08 DIAGNOSIS — M20031 Swan-neck deformity of right finger(s): Secondary | ICD-10-CM | POA: Diagnosis not present

## 2014-12-08 DIAGNOSIS — M05841 Other rheumatoid arthritis with rheumatoid factor of right hand: Secondary | ICD-10-CM | POA: Diagnosis not present

## 2014-12-08 DIAGNOSIS — M05842 Other rheumatoid arthritis with rheumatoid factor of left hand: Secondary | ICD-10-CM | POA: Diagnosis not present

## 2014-12-15 ENCOUNTER — Encounter (HOSPITAL_COMMUNITY): Payer: Medicare Other

## 2014-12-23 ENCOUNTER — Encounter (HOSPITAL_COMMUNITY): Payer: Self-pay

## 2014-12-23 ENCOUNTER — Encounter (INDEPENDENT_AMBULATORY_CARE_PROVIDER_SITE_OTHER): Payer: Self-pay

## 2014-12-23 ENCOUNTER — Encounter (HOSPITAL_COMMUNITY): Payer: Medicare Other

## 2014-12-23 ENCOUNTER — Encounter (HOSPITAL_COMMUNITY)
Admission: RE | Admit: 2014-12-23 | Discharge: 2014-12-23 | Disposition: A | Discharge status: Home / Self Care | Payer: Medicare Other | Source: Ambulatory Visit | Attending: Rheumatology | Admitting: Rheumatology

## 2014-12-23 DIAGNOSIS — M069 Rheumatoid arthritis, unspecified: Secondary | ICD-10-CM | POA: Diagnosis not present

## 2014-12-23 MED ORDER — DIPHENHYDRAMINE HCL 50 MG/ML IJ SOLN
50.0000 mg | INTRAMUSCULAR | Status: DC
  Administered 2014-12-23: 50 mg via INTRAVENOUS
  Filled 2014-12-23: qty 1

## 2014-12-23 MED ORDER — TOCILIZUMAB 400 MG/20ML IV SOLN
400.0000 mg | INTRAVENOUS | Status: DC
  Administered 2014-12-23: 400 mg via INTRAVENOUS
  Filled 2014-12-23: qty 20

## 2014-12-23 MED ORDER — SODIUM CHLORIDE 0.9 % IV SOLN
INTRAVENOUS | Status: DC
  Administered 2014-12-23: 10:00:00 via INTRAVENOUS

## 2014-12-23 NOTE — Discharge Instructions (Signed)
Tocilizumab injection  What is this medicine?  TOCILIZUMAB (TOE si LIZ ue mab) is used to treat rheumatoid arthritis (RA) and certain types of arthritis in children. This medicine helps reduce joint pain and swelling. This medicine is often used with other medicines.  This medicine may be used for other purposes; ask your health care provider or pharmacist if you have questions.  What should I tell my health care provider before I take this medicine?  They need to know if you have any of these conditions:  -cancer  -diabetes  -diverticulitis  -heart disease  -hepatitis B or history of hepatitis B infection  -high blood pressure  -high cholesterol  -history of pancreatitis  -immune system problems  -infection (especially a virus infection such as chickenpox, cold sores, or herpes)  -liver disease  -low blood counts, like low white cell, platelet, or red cell counts  -multiple sclerosis  -recently received or scheduled to receive a vaccine  -scheduled to have surgery  -stomach ulcer  -stroke  -tuberculosis, a positive skin test for tuberculosis or have recently been in close contact with someone who has tuberculosis  -an unusual or allergic reaction to tocilizumab, other medicines, foods, dyes, or preservatives  -pregnant or trying to get pregnant  -breast-feeding  How should I use this medicine?  This medicine is for infusion into a vein or for injection under the skin. It is usually given by a health care professional in a hospital or clinic setting. If you get this medicine at home, you will be taught how to prepare and give this medicine. Use exactly as directed. Take your medicine at regular intervals. Do not take your medicine more often than directed.  It is important that you put your used needles and syringes in a special sharps container. Do not put them in a trash can. If you do not have a sharps container, call your pharmacist or healthcare provider to get one.  A special MedGuide will be given to you by  the pharmacist with each prescription and refill. Be sure to read this information carefully each time.  Talk to your pediatrician regarding the use of this medicine in children. While the drug may be prescribed for children as young as 2 years for selected conditions, precautions do apply.  Overdosage: If you think you have taken too much of this medicine contact a poison control center or emergency room at once.  NOTE: This medicine is only for you. Do not share this medicine with others.  What if I miss a dose?  It is important not to miss your dose. Call your doctor or health care professional if you are unable to keep an appointment. If you give yourself the medicine and you miss a dose, take it as soon as you can. If it is almost time for your next dose, take only that dose. Do not take double or extra doses.  What may interact with this medicine?  Do not take this medicine with any of the following medications:  -live virus vaccines  This medicine may also interact with the following medications:  -abatacept  -adalimumab  -anakinra  -atorvastatin  -certolizumab  -cyclosporine  -dextromethorphan  -etanercept  -golimumab  -infliximab  -lovastatin  -medicines that lower the immune system  -ofatumumab  -omeprazole  -oral contraceptives  -rituximab  -simvastatin  -steroid medicines like prednisone or cortisone  -theophylline  -tositumomab  -vaccines  -warfarin  This list may not describe all possible interactions. Give your health care   provider a list of all the medicines, herbs, non-prescription drugs, or dietary supplements you use. Also tell them if you smoke, drink alcohol, or use illegal drugs. Some items may interact with your medicine.  What should I watch for while using this medicine?  Tell your doctor or healthcare professional if your symptoms do not start to get better or if they get worse.  Your condition will be monitored carefully while you are receiving this medicine.  You will be tested for  tuberculosis (TB) before you start this medicine. If your doctor prescribes any medicine for TB, you should start taking the TB medicine before starting this medicine. Make sure to finish the full course of TB medicine.  Talk to your doctor about your risk of cancer. You may be more at risk for certain types of cancers if you take this medicine.  Call your doctor or health care professional for advice if you get a fever, chills or sore throat, or other symptoms of a cold or flu. Do not treat yourself. This drug decreases your body's ability to fight infections. Try to avoid being around people who are sick.  You may need blood work done while you are taking this medicine.  What side effects may I notice from receiving this medicine?  Side effects that you should report to your doctor or health care professional as soon as possible:  -allergic reactions like skin rash, itching or hives, swelling of the face, lips, or tongue  -breathing problems  -feeling faint or lightheaded, falls  -fever or chills, sore throat  -stomach pain  -unusual bleeding or bruising  -yellowing of the eyes or skin  Side effects that usually do not require medical attention (Report these to your doctor or health care professional if they continue or are bothersome.):  -dizziness  -headache  -pain, redness, or irritation at site where injected  This list may not describe all possible side effects. Call your doctor for medical advice about side effects. You may report side effects to FDA at 1-800-FDA-1088.  Where should I keep my medicine?  Keep out of the reach of children. If you are using this medicine at home, you will be instructed on how to store this medicine. Throw away any unused medicine after the expiration date on the label.  NOTE: This sheet is a summary. It may not cover all possible information. If you have questions about this medicine, talk to your doctor, pharmacist, or health care provider.     © 2016, [employer].  (2011-12-02 13:59:59)

## 2015-01-14 ENCOUNTER — Encounter (HOSPITAL_COMMUNITY): Payer: Medicare Other

## 2015-01-20 DIAGNOSIS — S86812D Strain of other muscle(s) and tendon(s) at lower leg level, left leg, subsequent encounter: Secondary | ICD-10-CM | POA: Diagnosis not present

## 2015-01-20 DIAGNOSIS — Z96652 Presence of left artificial knee joint: Secondary | ICD-10-CM | POA: Diagnosis not present

## 2015-01-21 DIAGNOSIS — M25441 Effusion, right hand: Secondary | ICD-10-CM | POA: Diagnosis not present

## 2015-01-21 DIAGNOSIS — M25511 Pain in right shoulder: Secondary | ICD-10-CM | POA: Diagnosis not present

## 2015-01-21 DIAGNOSIS — M0570 Rheumatoid arthritis with rheumatoid factor of unspecified site without organ or systems involvement: Secondary | ICD-10-CM | POA: Diagnosis not present

## 2015-01-21 DIAGNOSIS — M25442 Effusion, left hand: Secondary | ICD-10-CM | POA: Diagnosis not present

## 2015-01-21 DIAGNOSIS — Z79899 Other long term (current) drug therapy: Secondary | ICD-10-CM | POA: Diagnosis not present

## 2015-01-21 DIAGNOSIS — M25562 Pain in left knee: Secondary | ICD-10-CM | POA: Diagnosis not present

## 2015-01-27 ENCOUNTER — Encounter (HOSPITAL_COMMUNITY): Payer: Self-pay

## 2015-01-27 ENCOUNTER — Encounter (HOSPITAL_COMMUNITY)
Admission: RE | Admit: 2015-01-27 | Discharge: 2015-01-27 | Disposition: A | Discharge status: Home / Self Care | Payer: Medicare Other | Source: Ambulatory Visit | Attending: Rheumatology | Admitting: Rheumatology

## 2015-01-27 ENCOUNTER — Encounter (HOSPITAL_COMMUNITY): Payer: Medicare Other

## 2015-01-27 DIAGNOSIS — M069 Rheumatoid arthritis, unspecified: Secondary | ICD-10-CM | POA: Insufficient documentation

## 2015-01-27 MED ORDER — DIPHENHYDRAMINE HCL 50 MG/ML IJ SOLN: 50.0000 mg | Freq: Once | INTRAMUSCULAR | Status: DC | PRN

## 2015-01-27 MED ORDER — SODIUM CHLORIDE 0.9 % IV SOLN
INTRAVENOUS | Status: DC
  Administered 2015-01-27: 250 mL via INTRAVENOUS

## 2015-01-27 MED ORDER — TOCILIZUMAB 400 MG/20ML IV SOLN
400.0000 mg | INTRAVENOUS | Status: DC
  Administered 2015-01-27: 400 mg via INTRAVENOUS
  Filled 2015-01-27: qty 20

## 2015-01-27 MED ORDER — ACETAMINOPHEN 500 MG PO TABS: 1000.0000 mg | ORAL_TABLET | Freq: Once | ORAL | Status: DC | PRN

## 2015-01-27 MED ORDER — DIPHENHYDRAMINE HCL 50 MG/ML IJ SOLN
50.0000 mg | INTRAMUSCULAR | Status: DC
  Administered 2015-01-27: 50 mg via INTRAVENOUS
  Filled 2015-01-27: qty 1

## 2015-02-04 DIAGNOSIS — M0570 Rheumatoid arthritis with rheumatoid factor of unspecified site without organ or systems involvement: Secondary | ICD-10-CM | POA: Diagnosis not present

## 2015-02-04 DIAGNOSIS — Z79899 Other long term (current) drug therapy: Secondary | ICD-10-CM | POA: Diagnosis not present

## 2015-02-06 DIAGNOSIS — M20001 Unspecified deformity of right finger(s): Secondary | ICD-10-CM | POA: Diagnosis not present

## 2015-02-06 DIAGNOSIS — M069 Rheumatoid arthritis, unspecified: Secondary | ICD-10-CM | POA: Diagnosis not present

## 2015-02-11 ENCOUNTER — Other Ambulatory Visit: Payer: Self-pay | Admitting: Orthopedic Surgery

## 2015-02-25 ENCOUNTER — Encounter (HOSPITAL_COMMUNITY): Payer: Self-pay

## 2015-02-25 ENCOUNTER — Encounter (HOSPITAL_COMMUNITY)
Admission: RE | Admit: 2015-02-25 | Discharge: 2015-02-25 | Disposition: A | Discharge status: Home / Self Care | Payer: Medicare Other | Source: Ambulatory Visit | Attending: Rheumatology | Admitting: Rheumatology

## 2015-02-25 DIAGNOSIS — M069 Rheumatoid arthritis, unspecified: Secondary | ICD-10-CM | POA: Diagnosis not present

## 2015-02-25 MED ORDER — TOCILIZUMAB 400 MG/20ML IV SOLN
400.0000 mg | INTRAVENOUS | Status: DC
  Administered 2015-02-25: 400 mg via INTRAVENOUS
  Filled 2015-02-25: qty 20

## 2015-02-25 MED ORDER — ACETAMINOPHEN 500 MG PO TABS: 1000.0000 mg | ORAL_TABLET | ORAL | Status: DC

## 2015-02-25 MED ORDER — DIPHENHYDRAMINE HCL 50 MG/ML IJ SOLN: 50.0000 mg | Freq: Once | INTRAMUSCULAR | Status: DC | PRN

## 2015-02-25 MED ORDER — ACETAMINOPHEN 500 MG PO TABS: 1000.0000 mg | ORAL_TABLET | Freq: Once | ORAL | Status: DC | PRN

## 2015-02-25 MED ORDER — DIPHENHYDRAMINE HCL 50 MG/ML IJ SOLN
50.0000 mg | INTRAMUSCULAR | Status: DC
  Administered 2015-02-25: 50 mg via INTRAVENOUS
  Filled 2015-02-25: qty 1

## 2015-02-25 MED ORDER — SODIUM CHLORIDE 0.9 % IV SOLN
INTRAVENOUS | Status: DC
  Administered 2015-02-25: 10:00:00 via INTRAVENOUS

## 2015-02-25 NOTE — Progress Notes (Signed)
Notified Dr Charlestine Night that patient is to have surgery on her finger in 10-14 days by Dr Fredna Dow. " Okay to infuse actemera today. May infuse 03/25/15 if no active signs of infection." as per Dr Charlestine Night.

## 2015-02-26 ENCOUNTER — Encounter (HOSPITAL_BASED_OUTPATIENT_CLINIC_OR_DEPARTMENT_OTHER): Payer: Self-pay | Admitting: *Deleted

## 2015-03-05 ENCOUNTER — Ambulatory Visit (HOSPITAL_BASED_OUTPATIENT_CLINIC_OR_DEPARTMENT_OTHER): Payer: Medicare Other | Admitting: Certified Registered"

## 2015-03-05 ENCOUNTER — Ambulatory Visit (HOSPITAL_BASED_OUTPATIENT_CLINIC_OR_DEPARTMENT_OTHER)
Admission: RE | Admit: 2015-03-05 | Discharge: 2015-03-05 | Disposition: A | Discharge status: Home / Self Care | Payer: Medicare Other | Source: Ambulatory Visit | Attending: Orthopedic Surgery | Admitting: Orthopedic Surgery

## 2015-03-05 ENCOUNTER — Encounter (HOSPITAL_BASED_OUTPATIENT_CLINIC_OR_DEPARTMENT_OTHER): Payer: Self-pay

## 2015-03-05 ENCOUNTER — Encounter (HOSPITAL_BASED_OUTPATIENT_CLINIC_OR_DEPARTMENT_OTHER)
Admission: RE | Disposition: A | Discharge status: Home / Self Care | Payer: Self-pay | Source: Ambulatory Visit | Attending: Orthopedic Surgery

## 2015-03-05 DIAGNOSIS — M24341 Pathological dislocation of right hand, not elsewhere classified: Secondary | ICD-10-CM | POA: Insufficient documentation

## 2015-03-05 DIAGNOSIS — Z87891 Personal history of nicotine dependence: Secondary | ICD-10-CM | POA: Diagnosis not present

## 2015-03-05 DIAGNOSIS — M06841 Other specified rheumatoid arthritis, right hand: Secondary | ICD-10-CM | POA: Diagnosis not present

## 2015-03-05 DIAGNOSIS — Z96652 Presence of left artificial knee joint: Secondary | ICD-10-CM | POA: Insufficient documentation

## 2015-03-05 DIAGNOSIS — S63202A Unspecified subluxation of right middle finger, initial encounter: Secondary | ICD-10-CM | POA: Diagnosis present

## 2015-03-05 DIAGNOSIS — M069 Rheumatoid arthritis, unspecified: Secondary | ICD-10-CM | POA: Diagnosis not present

## 2015-03-05 HISTORY — PX: REPAIR EXTENSOR TENDON: SHX5382

## 2015-03-05 SURGERY — REPAIR, TENDON, EXTENSOR
Anesthesia: General | Site: Finger | Laterality: Right

## 2015-03-05 MED ORDER — FENTANYL CITRATE (PF) 100 MCG/2ML IJ SOLN
INTRAMUSCULAR | Status: AC
  Filled 2015-03-05: qty 2

## 2015-03-05 MED ORDER — CEFAZOLIN SODIUM-DEXTROSE 2-3 GM-% IV SOLR
2.0000 g | INTRAVENOUS | Status: DC
  Administered 2015-03-05: 2 g via INTRAVENOUS

## 2015-03-05 MED ORDER — CHLORHEXIDINE GLUCONATE 4 % EX LIQD: 60.0000 mL | Freq: Once | CUTANEOUS | Status: DC

## 2015-03-05 MED ORDER — LIDOCAINE HCL (CARDIAC) 20 MG/ML IV SOLN
INTRAVENOUS | Status: DC | PRN
  Administered 2015-03-05: 60 mg via INTRAVENOUS

## 2015-03-05 MED ORDER — CEFAZOLIN SODIUM-DEXTROSE 2-3 GM-% IV SOLR: 2.0000 g | INTRAVENOUS | Status: DC

## 2015-03-05 MED ORDER — HYDROCODONE-ACETAMINOPHEN 5-325 MG PO TABS: 1.0000 | ORAL_TABLET | Freq: Four times a day (QID) | ORAL | Status: DC | PRN

## 2015-03-05 MED ORDER — PROPOFOL 10 MG/ML IV BOLUS
INTRAVENOUS | Status: DC | PRN
  Administered 2015-03-05: 150 mg via INTRAVENOUS

## 2015-03-05 MED ORDER — LACTATED RINGERS IV SOLN
INTRAVENOUS | Status: DC
  Administered 2015-03-05: 09:00:00 via INTRAVENOUS

## 2015-03-05 MED ORDER — MIDAZOLAM HCL 2 MG/2ML IJ SOLN: 1.0000 mg | INTRAMUSCULAR | Status: DC | PRN

## 2015-03-05 MED ORDER — GLYCOPYRROLATE 0.2 MG/ML IJ SOLN: 0.2000 mg | Freq: Once | INTRAMUSCULAR | Status: DC | PRN

## 2015-03-05 MED ORDER — LIDOCAINE HCL (CARDIAC) 20 MG/ML IV SOLN
INTRAVENOUS | Status: AC
  Filled 2015-03-05: qty 5

## 2015-03-05 MED ORDER — MEPERIDINE HCL 25 MG/ML IJ SOLN: 6.2500 mg | INTRAMUSCULAR | Status: DC | PRN

## 2015-03-05 MED ORDER — OXYCODONE HCL 5 MG PO TABS
5.0000 mg | ORAL_TABLET | Freq: Once | ORAL | Status: AC | PRN
  Administered 2015-03-05: 5 mg via ORAL

## 2015-03-05 MED ORDER — HYDROMORPHONE HCL 1 MG/ML IJ SOLN
0.2500 mg | INTRAMUSCULAR | Status: DC | PRN
  Administered 2015-03-05: 0.5 mg via INTRAVENOUS

## 2015-03-05 MED ORDER — SCOPOLAMINE 1 MG/3DAYS TD PT72: 1.0000 | MEDICATED_PATCH | Freq: Once | TRANSDERMAL | Status: DC | PRN

## 2015-03-05 MED ORDER — ONDANSETRON HCL 4 MG/2ML IJ SOLN
INTRAMUSCULAR | Status: AC
  Filled 2015-03-05: qty 2

## 2015-03-05 MED ORDER — DEXAMETHASONE SODIUM PHOSPHATE 10 MG/ML IJ SOLN
INTRAMUSCULAR | Status: DC | PRN
  Administered 2015-03-05: 6 mg via INTRAVENOUS

## 2015-03-05 MED ORDER — OXYCODONE HCL 5 MG PO TABS
ORAL_TABLET | ORAL | Status: AC
  Filled 2015-03-05: qty 1

## 2015-03-05 MED ORDER — FENTANYL CITRATE (PF) 100 MCG/2ML IJ SOLN
50.0000 ug | INTRAMUSCULAR | Status: DC | PRN
  Administered 2015-03-05: 50 ug via INTRAVENOUS

## 2015-03-05 MED ORDER — HYDROMORPHONE HCL 1 MG/ML IJ SOLN
INTRAMUSCULAR | Status: AC
  Filled 2015-03-05: qty 1

## 2015-03-05 MED ORDER — BUPIVACAINE HCL (PF) 0.25 % IJ SOLN
INTRAMUSCULAR | Status: DC | PRN
  Administered 2015-03-05: 4 mL

## 2015-03-05 MED ORDER — OXYCODONE HCL 5 MG/5ML PO SOLN
5.0000 mg | Freq: Once | ORAL | Status: AC | PRN
Start: 2015-03-05 — End: 2015-03-05

## 2015-03-05 MED ORDER — EPHEDRINE SULFATE 50 MG/ML IJ SOLN
INTRAMUSCULAR | Status: DC | PRN
  Administered 2015-03-05: 10 mg via INTRAVENOUS

## 2015-03-05 SURGICAL SUPPLY — 78 items
BAG DECANTER FOR FLEXI CONT (MISCELLANEOUS) IMPLANT
BALL CTTN LRG ABS STRL LF (GAUZE/BANDAGES/DRESSINGS)
BLADE MINI RND TIP GREEN BEAV (BLADE) ×2 IMPLANT
BLADE SURG 15 STRL LF DISP TIS (BLADE) ×1 IMPLANT
BLADE SURG 15 STRL SS (BLADE) ×3
BNDG CMPR 9X4 STRL LF SNTH (GAUZE/BANDAGES/DRESSINGS) ×1
BNDG COHESIVE 3X5 TAN STRL LF (GAUZE/BANDAGES/DRESSINGS) ×3 IMPLANT
BNDG ESMARK 4X9 LF (GAUZE/BANDAGES/DRESSINGS) ×2 IMPLANT
BNDG GAUZE ELAST 4 BULKY (GAUZE/BANDAGES/DRESSINGS) ×3 IMPLANT
CHLORAPREP W/TINT 26ML (MISCELLANEOUS) ×3 IMPLANT
CORDS BIPOLAR (ELECTRODE) ×3 IMPLANT
COTTONBALL LRG STERILE PKG (GAUZE/BANDAGES/DRESSINGS) IMPLANT
COVER BACK TABLE 60X90IN (DRAPES) ×3 IMPLANT
COVER MAYO STAND STRL (DRAPES) ×3 IMPLANT
CUFF TOURNIQUET SINGLE 18IN (TOURNIQUET CUFF) ×2 IMPLANT
DECANTER SPIKE VIAL GLASS SM (MISCELLANEOUS) IMPLANT
DRAIN TLS ROUND 10FR (DRAIN) IMPLANT
DRAPE EXTREMITY T 121X128X90 (DRAPE) ×3 IMPLANT
DRAPE OEC MINIVIEW 54X84 (DRAPES) IMPLANT
DRAPE SURG 17X23 STRL (DRAPES) ×3 IMPLANT
GAUZE SPONGE 4X4 12PLY STRL (GAUZE/BANDAGES/DRESSINGS) ×3 IMPLANT
GAUZE SPONGE 4X4 16PLY XRAY LF (GAUZE/BANDAGES/DRESSINGS) IMPLANT
GAUZE XEROFORM 1X8 LF (GAUZE/BANDAGES/DRESSINGS) ×3 IMPLANT
GLOVE BIOGEL PI IND STRL 7.0 (GLOVE) IMPLANT
GLOVE BIOGEL PI IND STRL 8.5 (GLOVE) ×1 IMPLANT
GLOVE BIOGEL PI INDICATOR 7.0 (GLOVE) ×4
GLOVE BIOGEL PI INDICATOR 8.5 (GLOVE) ×2
GLOVE ECLIPSE 6.5 STRL STRAW (GLOVE) ×2 IMPLANT
GLOVE SURG ORTHO 8.0 STRL STRW (GLOVE) ×3 IMPLANT
GOWN STRL REUS W/ TWL LRG LVL3 (GOWN DISPOSABLE) ×1 IMPLANT
GOWN STRL REUS W/TWL LRG LVL3 (GOWN DISPOSABLE) ×3
GOWN STRL REUS W/TWL XL LVL3 (GOWN DISPOSABLE) ×3 IMPLANT
K-WIRE .035X4 (WIRE) IMPLANT
LOOP VESSEL MAXI BLUE (MISCELLANEOUS) IMPLANT
NDL KEITH (NEEDLE) IMPLANT
NDL PRECISIONGLIDE 27X1.5 (NEEDLE) IMPLANT
NEEDLE HYPO 22GX1.5 SAFETY (NEEDLE) IMPLANT
NEEDLE KEITH (NEEDLE) IMPLANT
NEEDLE PRECISIONGLIDE 27X1.5 (NEEDLE) ×3 IMPLANT
NS IRRIG 1000ML POUR BTL (IV SOLUTION) ×3 IMPLANT
PACK BASIN DAY SURGERY FS (CUSTOM PROCEDURE TRAY) ×3 IMPLANT
PAD CAST 3X4 CTTN HI CHSV (CAST SUPPLIES) ×1 IMPLANT
PADDING CAST ABS 3INX4YD NS (CAST SUPPLIES)
PADDING CAST ABS 4INX4YD NS (CAST SUPPLIES) ×2
PADDING CAST ABS COTTON 3X4 (CAST SUPPLIES) IMPLANT
PADDING CAST ABS COTTON 4X4 ST (CAST SUPPLIES) ×1 IMPLANT
PADDING CAST COTTON 3X4 STRL (CAST SUPPLIES) ×3
SLEEVE SCD COMPRESS KNEE MED (MISCELLANEOUS) ×2 IMPLANT
SPLINT PLASTER CAST XFAST 3X15 (CAST SUPPLIES) IMPLANT
SPLINT PLASTER XTRA FASTSET 3X (CAST SUPPLIES) ×20
STOCKINETTE 4X48 STRL (DRAPES) ×3 IMPLANT
SUT CHROMIC 5 0 P 3 (SUTURE) ×2 IMPLANT
SUT ETHIBOND 3-0 V-5 (SUTURE) IMPLANT
SUT ETHILON 4 0 PS 2 18 (SUTURE) ×3 IMPLANT
SUT ETHILON 5 0 PC 1 (SUTURE) ×1 IMPLANT
SUT FIBERWIRE 2-0 18 17.9 3/8 (SUTURE)
SUT FIBERWIRE 4-0 18 TAPR NDL (SUTURE)
SUT MERSILENE 2.0 SH NDLE (SUTURE) IMPLANT
SUT MERSILENE 3 0 FS 1 (SUTURE) IMPLANT
SUT MERSILENE 4 0 P 3 (SUTURE) ×2 IMPLANT
SUT POLY BUTTON 15MM (SUTURE) IMPLANT
SUT PROLENE 2 0 SH DA (SUTURE) IMPLANT
SUT SILK 2 0 FS (SUTURE) IMPLANT
SUT SILK 4 0 PS 2 (SUTURE) IMPLANT
SUT STEEL 3 0 (SUTURE) IMPLANT
SUT STEEL 4 0 V 26 (SUTURE) IMPLANT
SUT VIC AB 3-0 PS1 18 (SUTURE)
SUT VIC AB 3-0 PS1 18XBRD (SUTURE) IMPLANT
SUT VIC AB 4-0 P-3 18XBRD (SUTURE) IMPLANT
SUT VIC AB 4-0 P3 18 (SUTURE)
SUT VICRYL 4-0 PS2 18IN ABS (SUTURE) IMPLANT
SUTURE FIBERWR 2-0 18 17.9 3/8 (SUTURE) IMPLANT
SUTURE FIBERWR 4-0 18 TAPR NDL (SUTURE) IMPLANT
SYR BULB 3OZ (MISCELLANEOUS) ×3 IMPLANT
SYR CONTROL 10ML LL (SYRINGE) ×2 IMPLANT
TOWEL OR 17X24 6PK STRL BLUE (TOWEL DISPOSABLE) ×4 IMPLANT
TUBE FEEDING 5FR 15 INCH (TUBING) IMPLANT
UNDERPAD 30X30 (UNDERPADS AND DIAPERS) ×3 IMPLANT

## 2015-03-05 NOTE — Brief Op Note (Signed)
03/05/2015  10:14 AM  PATIENT:  Susy Frizzle  80 y.o. female  PRE-OPERATIVE DIAGNOSIS:  SUBLUXATING EXTENSOR TENDON RIGHT MIDDLE FINGER  POST-OPERATIVE DIAGNOSIS:  SUBLUXATING EXTENSOR TENDON RIGHT MIDDLE FINGER  PROCEDURE:  Procedure(s) with comments: RECONSTRUCTION EXTENSOR HOOD RIGHT MIDDLE FINGER (Right) - ANESTHESIA:  IV REGIONAL FAB  SURGEON:  Surgeon(s) and Role:    * Daryll Brod, MD - Primary  PHYSICIAN ASSISTANT:   ASSISTANTS: none   ANESTHESIA:   local and general  EBL:     BLOOD ADMINISTERED:none  DRAINS: none   LOCAL MEDICATIONS USED:  BUPIVICAINE   SPECIMEN:  No Specimen  DISPOSITION OF SPECIMEN:  N/A  COUNTS:  YES  TOURNIQUET:   Total Tourniquet Time Documented: Upper Arm (Right) - 30 minutes Total: Upper Arm (Right) - 30 minutes   DICTATION: .Other Dictation: Dictation Number [id number]  PLAN OF CARE: Discharge to home after PACU  PATIENT DISPOSITION:  PACU - hemodynamically stable.

## 2015-03-05 NOTE — Anesthesia Procedure Notes (Signed)
Procedure Name: LMA Insertion Date/Time: 03/05/2015 9:19 AM Performed by: Ishaaq Penna D Pre-anesthesia Checklist: Patient identified, Emergency Drugs available, Suction available and Patient being monitored Patient Re-evaluated:Patient Re-evaluated prior to inductionOxygen Delivery Method: Circle System Utilized Preoxygenation: Pre-oxygenation with 100% oxygen Intubation Type: IV induction Ventilation: Mask ventilation without difficulty LMA: LMA inserted LMA Size: 4.0 Number of attempts: 1 Airway Equipment and Method: Bite block Placement Confirmation: positive ETCO2 Tube secured with: Tape Dental Injury: Teeth and Oropharynx as per pre-operative assessment

## 2015-03-05 NOTE — Op Note (Signed)
NAMELATICA, PATRONE              ACCOUNT NO.:  [id number]  MEDICAL RECORD NO.:  [id number]  LOCATION:                                 FACILITY:  PHYSICIAN:  Daryll Brod, M.D.       DATE OF BIRTH:  1935/09/26  DATE OF PROCEDURE:  03/05/2015 DATE OF DISCHARGE:                              OPERATIVE REPORT   PREOPERATIVE DIAGNOSIS:  Subluxating extensor tendon, right middle finger.  POSTOPERATIVE DIAGNOSIS:  Subluxating extensor tendon, right middle finger.  OPERATION:  Reconstruction of extensor hood, right middle finger, with centralization of extensor tendon.  SURGEON:  Daryll Brod, MD.  ANESTHESIA:  General with local infiltration.  ANESTHESIOLOGIST:  [employer].  HISTORY:  The patient is a 80 year old female with a history of rheumatoid arthritis.  She has had developed subluxation of the extensor tendon, right middle finger.  This has not responded to conservative treatment.  She has elected to undergo surgical reconstruction.  Pre, peri, and postoperative courses have been discussed along with risks and complications.  She is aware that there is no guarantee with the surgery; possibility of infection; recurrence of injury to arteries, nerves, tendons; incomplete relief of symptoms; dystrophy.  In the preoperative area, the patient is seen, the extremity marked by both the patient and surgeon.  Antibiotic given.  PROCEDURE IN DETAIL:  The patient was brought to the operating room, where a general anesthetic was carried out without difficulty.  She was prepped using ChloraPrep, supine position with the right arm free.  A 3- minute dry time was allowed.  Time-out taken, confirming the patient and procedure.  The limb was exsanguinated with an Esmarch bandage. Tourniquet placed high on the arm and was inflated to 250 mmHg.  A curvilinear incision was made over the metacarpophalangeal joint, right middle finger, carried down through subcutaneous tissue.  Bleeders  were electrocauterized with bipolar.  The extensor hood was found to be markedly stretched on the radial aspect with some contracture to the ulnar side.  The ulnar sagittal fibers were released, this allowed the tendon to be centralized, it was extremely small.  A small portion of the extensor tendon was then harvested, left attached distally, this was then passed through the central tendon distally over the proximal phalanx.  This was then brought around the inter-metacarpal ligament, brought back up and sutured to the tendon more proximally. This stabilized the tendon directly over the metacarpal head with full flexion and extension.  The wound was copiously irrigated with saline. The repair was done with 4-0 Mersilene sutures.  The stretched sagittal fibers on the radial side were then imbricated, sutured with horizontal mattress of 4-0 Mersilene sutures reinforcing the prior repair, full flexion and extension, no further subluxation of the tendon was noted. A small opening in the capsule was then closed with a running 5-0 chromic suture.  The wound was copiously irrigated again, and the skin closed with interrupted 4-0 nylon sutures.  A sterile compressive dressing and volar splint was applied.  Prior to this, the area was locally infiltrated with 0.25% bupivacaine without epinephrine, 6 mL was used.  On deflation of the tourniquet, all fingers immediately pinked.  She was taken to  the recovery room for observation in a satisfactory condition.  She will be discharged home to return to the [facility] in 1 week, on [place].          ______________________________ Daryll Brod, M.D.     GK/MEDQ  D:  03/05/2015  T:  03/05/2015  Job:  [id number]

## 2015-03-05 NOTE — Transfer of Care (Signed)
Immediate Anesthesia Transfer of Care Note  Patient: [name]  Procedure(s) Performed: Procedure(s) with comments: RECONSTRUCTION EXTENSOR HOOD RIGHT MIDDLE FINGER (Right) - ANESTHESIA:  IV REGIONAL FAB  Patient Location: PACU  Anesthesia Type:General  Level of Consciousness: awake, alert , oriented and patient cooperative  Airway & Oxygen Therapy: Patient Spontanous Breathing and Patient connected to face mask oxygen  Post-op Assessment: Report given to RN and Post -op Vital signs reviewed and stable  Post vital signs: Reviewed and stable  Last Vitals:  Filed Vitals:   03/05/15 0819  BP: 155/84  Pulse: 73  Temp: 36.6 C  Resp: 18    Complications: No apparent anesthesia complications

## 2015-03-05 NOTE — Anesthesia Preprocedure Evaluation (Addendum)
Anesthesia Evaluation  Patient identified by MRN, date of birth, ID band Patient awake    Reviewed: Allergy & Precautions, NPO status , Patient's Chart, lab work & pertinent test results  History of Anesthesia Complications (+) PONV and history of anesthetic complications  Airway Mallampati: I  TM Distance: >3 FB Neck ROM: Full    Dental  (+) Teeth Intact, Dental Advisory Given,    Pulmonary former smoker,    breath sounds clear to auscultation       Cardiovascular  Rhythm:Regular Rate:Normal     Neuro/Psych    GI/Hepatic   Endo/Other    Renal/GU      Musculoskeletal  (+) Arthritis ,   Abdominal   Peds  Hematology   Anesthesia Other Findings   Reproductive/Obstetrics                            Anesthesia Physical Anesthesia Plan  ASA: II  Anesthesia Plan: General   Post-op Pain Management:    Induction: Intravenous  Airway Management Planned: LMA  Additional Equipment:   Intra-op Plan:   Post-operative Plan: Extubation in OR  Informed Consent: I have reviewed the patients History and Physical, chart, labs and discussed the procedure including the risks, benefits and alternatives for the proposed anesthesia with the patient or authorized representative who has indicated his/her understanding and acceptance.   Dental advisory given  Plan Discussed with: CRNA, Anesthesiologist and Surgeon  Anesthesia Plan Comments:         Anesthesia Quick Evaluation

## 2015-03-05 NOTE — Discharge Instructions (Addendum)

## 2015-03-05 NOTE — H&P (Signed)
[name] is an 80 y.o. female.   Chief Complaint: inability to extend middle finger HPI: [name] is a 80yo female complaining of catching and the inability to extend her right middle finger. This has failed conservative treatment. She  has subluxating extensor tendon, significant rheumatoid arthritis on her right middle finger. She shows laxity of all the collateral ligaments, but no ulnar deviation save for the middle finger.      Past Medical History  Diagnosis Date  . PONV (postoperative nausea and vomiting)     none recently  . Arthritis     rheumatoid  . Shingles 04/21/14    seen by Dr Joylene Draft and treated with Valtrex    Past Surgical History  Procedure Laterality Date  . Joint replacement  1990    right knee  . Wrist bilateral  [id number]    for arthritis  . Dilation and curettage of uterus    . Left knee replacement  2012    x2 surgeries-ruptures patella tendon, fractured patella  . Patellar tendon repair  07/21/2011    Procedure: PATELLA TENDON REPAIR;  Surgeon: Gearlean Alf, MD;  Location: [facility];  Service: Orthopedics;  Laterality: Left;  Left Knee Patella Tendon Reconstruction with allograft  . Tonsillectomy      as child  . Abdominal hysterectomy  1984    ovarys left in  . Cholecystectomy  2005  . Quadriceps tendon repair Left 09/26/2012    Procedure: LEFT EXTENSOR MECHANISM RECONSTRUCTION/WITH GRAFT;  Surgeon: Gearlean Alf, MD;  Location: [facility];  Service: Orthopedics;  Laterality: Left;  . Knee reconstruction Left 09/26/12    History reviewed. No pertinent family history. Social History:  reports that she quit smoking about 10 years ago. Her smoking use included Cigarettes. She has a 7.5 pack-year smoking history. She has never used smokeless tobacco. She reports that she does not drink alcohol or use illicit drugs.  Allergies:  Allergies  Allergen Reactions  . Morphine And Related Other (See Comments)    "makes me queezy"  . Sulfa Antibiotics    Hives as child-age 7    Medications Prior to Admission  Medication Sig Dispense Refill  . acetaminophen (TYLENOL) 500 MG tablet Take 1,000 mg by mouth every 6 (six) hours as needed. For headache    . beta carotene w/minerals (OCUVITE) tablet Take 1 tablet by mouth daily.    . diphenhydrAMINE (SOMINEX) 25 MG tablet Take 25 mg by mouth as needed for allergies or sleep (take 12.5 to 25 mg as needed).    Marland Kitchen estrogens, conjugated, (PREMARIN) 0.3 MG tablet Take 0.3 mg by mouth daily with breakfast.     . fish oil-omega-3 fatty acids 1000 MG capsule Take 1 g by mouth 2 (two) times daily.    . folic acid (FOLVITE) 1 MG tablet Take 1 mg by mouth daily with breakfast.    . Methotrexate Sodium (METHOTREXATE PO) Take 2.5 mg by mouth. 4 tablets on Saturday and 4 tablets on Sunday    . naphazoline-glycerin (CLEAR EYES) 0.012-0.2 % SOLN Place 1-2 drops into both eyes every 4 (four) hours as needed (dry eyes).    Marland Kitchen OVER THE COUNTER MEDICATION Take 1 tablet by mouth 2 (two) times daily. Algae-Cal=750mg  Calcium, 65mg  Magnesium, and 1000 units of Vitamin D    . oxymetazoline (AFRIN) 0.05 % nasal spray Place 2 sprays into the nose as needed. For congestion    . Propylene Glycol (SYSTANE BALANCE OP) Apply 1 drop to eye as  needed.    Marland Kitchen Propylene Glycol (SYSTANE BALANCE OP) Apply 1 drop to eye as needed.    . rosuvastatin (CRESTOR) 10 MG tablet Take 10 mg by mouth 3 (three) times a week.     . tocilizumab (ACTEMRA) 400 MG/20ML SOLN injection Inject into the vein once.    . traMADol (ULTRAM) 50 MG tablet Take 1-2 tablets (50-100 mg total) by mouth every 6 (six) hours as needed. 80 tablet 1  . vitamin C (ASCORBIC ACID) 500 MG tablet Take 500 mg by mouth daily.     Marland Kitchen VITAMIN D, ERGOCALCIFEROL, PO Take 10,000 Units by mouth once a week. On Wednesdays    . ibandronate (BONIVA) 150 MG tablet Take 150 mg by mouth every 30 (thirty) days. Take in the morning with a full glass of water, on an empty stomach, and do not take  anything else by mouth or lie down for the next 30 min.      No results found for this or any previous visit (from the past 48 hour(s)).  No results found.   Pertinent items are noted in HPI.  Blood pressure 155/84, pulse 73, temperature 97.9 F (36.6 C), resp. rate 18, height 5\' 4"  (1.626 m), weight 85.957 kg (189 lb 8 oz), SpO2 96 %.  General appearance: alert, cooperative and appears stated age Head: Normocephalic, without obvious abnormality Neck: no JVD Resp: clear to auscultation bilaterally Cardio: regular rate and rhythm, S1, S2 normal, no murmur, click, rub or gallop GI: soft, non-tender; bowel sounds normal; no masses,  no organomegaly Extremities: subluxating extensor tendon RMF with swanneck deformity Pulses: 2+ and symmetric Skin: Skin color, texture, turgor normal. No rashes or lesions Neurologic: Grossly normal Incision/Wound: na  Assessment/Plan Diagnosis; subluxating extensor tendon right middle finger Plan;  We have discussed the possibility of reconstruction of the hood with her along with tendon transfer. Due to the laxity involved without the windswept deformities, I think that we can get by without having to do a transfer of the intrinsics and will concentrate on just reconstructing the hood of the right middle finger.  Postoperative course discussed along with risks and complications. She is aware there is no guarantee with the surgery, possibility of infection, recurrence, injury to arteries, nerves, tendons, incomplete relief, symptoms of dystrophy. She is scheduled for reconstruction of extensor hood right middle finger as an outpatient under regional anesthesia   [name] R 03/05/2015, 8:59 AM

## 2015-03-05 NOTE — Op Note (Signed)
Dictation Number [id number]

## 2015-03-05 NOTE — Anesthesia Postprocedure Evaluation (Signed)
Anesthesia Post Note  Patient: [name]  Procedure(s) Performed: Procedure(s) (LRB): RECONSTRUCTION EXTENSOR HOOD RIGHT MIDDLE FINGER (Right)  Patient location during evaluation: PACU Anesthesia Type: General Level of consciousness: awake and alert Pain management: pain level controlled Vital Signs Assessment: post-procedure vital signs reviewed and stable Respiratory status: spontaneous breathing, nonlabored ventilation and respiratory function stable Cardiovascular status: blood pressure returned to baseline and stable Postop Assessment: no signs of nausea or vomiting Anesthetic complications: no    Last Vitals:  Filed Vitals:   03/05/15 1030 03/05/15 1045  BP: 130/72 130/66  Pulse: 89 91  Temp:    Resp: 12 17    Last Pain:  Filed Vitals:   03/05/15 1052  PainSc: 2                  [name] A

## 2015-03-06 ENCOUNTER — Encounter (HOSPITAL_BASED_OUTPATIENT_CLINIC_OR_DEPARTMENT_OTHER): Payer: Self-pay | Admitting: Orthopedic Surgery

## 2015-03-06 NOTE — Addendum Note (Signed)
Addendum  created 03/06/15 1308 by Tawni Millers, CRNA   Modules edited: Charges VN

## 2015-03-11 DIAGNOSIS — M20001 Unspecified deformity of right finger(s): Secondary | ICD-10-CM | POA: Diagnosis not present

## 2015-03-12 ENCOUNTER — Encounter (HOSPITAL_COMMUNITY): Payer: Medicare Other

## 2015-03-18 DIAGNOSIS — M20001 Unspecified deformity of right finger(s): Secondary | ICD-10-CM | POA: Diagnosis not present

## 2015-03-23 DIAGNOSIS — M79644 Pain in right finger(s): Secondary | ICD-10-CM | POA: Diagnosis not present

## 2015-03-23 DIAGNOSIS — M20001 Unspecified deformity of right finger(s): Secondary | ICD-10-CM | POA: Diagnosis not present

## 2015-03-25 ENCOUNTER — Encounter (HOSPITAL_COMMUNITY): Payer: Medicare Other

## 2015-03-25 DIAGNOSIS — M20002 Unspecified deformity of left finger(s): Secondary | ICD-10-CM | POA: Diagnosis not present

## 2015-03-25 DIAGNOSIS — M20001 Unspecified deformity of right finger(s): Secondary | ICD-10-CM | POA: Diagnosis not present

## 2015-04-08 DIAGNOSIS — M069 Rheumatoid arthritis, unspecified: Secondary | ICD-10-CM | POA: Diagnosis not present

## 2015-04-08 DIAGNOSIS — M20001 Unspecified deformity of right finger(s): Secondary | ICD-10-CM | POA: Diagnosis not present

## 2015-04-15 ENCOUNTER — Encounter (HOSPITAL_COMMUNITY)
Admission: RE | Admit: 2015-04-15 | Discharge: 2015-04-15 | Disposition: A | Discharge status: Home / Self Care | Payer: Medicare Other | Source: Ambulatory Visit | Attending: Rheumatology | Admitting: Rheumatology

## 2015-04-15 ENCOUNTER — Encounter (HOSPITAL_COMMUNITY): Payer: Self-pay

## 2015-04-15 DIAGNOSIS — M069 Rheumatoid arthritis, unspecified: Secondary | ICD-10-CM | POA: Diagnosis not present

## 2015-04-15 MED ORDER — SODIUM CHLORIDE 0.9 % IV SOLN
INTRAVENOUS | Status: DC
  Administered 2015-04-15: 11:00:00 via INTRAVENOUS

## 2015-04-15 MED ORDER — TOCILIZUMAB 400 MG/20ML IV SOLN
400.0000 mg | INTRAVENOUS | Status: DC
  Administered 2015-04-15: 400 mg via INTRAVENOUS
  Filled 2015-04-15: qty 20

## 2015-04-15 MED ORDER — DIPHENHYDRAMINE HCL 50 MG/ML IJ SOLN
50.0000 mg | INTRAMUSCULAR | Status: DC
  Administered 2015-04-15: 50 mg via INTRAVENOUS
  Filled 2015-04-15: qty 1

## 2015-04-15 MED ORDER — ACETAMINOPHEN 500 MG PO TABS: 1000.0000 mg | ORAL_TABLET | ORAL | Status: DC

## 2015-04-15 NOTE — Discharge Instructions (Signed)
Tocilizumab injection  What is this medicine?  TOCILIZUMAB (TOE si LIZ ue mab) is used to treat rheumatoid arthritis (RA) and certain types of arthritis in children. This medicine helps reduce joint pain and swelling. This medicine is often used with other medicines.  This medicine may be used for other purposes; ask your health care provider or pharmacist if you have questions.  What should I tell my health care provider before I take this medicine?  They need to know if you have any of these conditions:  -cancer  -diabetes  -diverticulitis  -heart disease  -hepatitis B or history of hepatitis B infection  -high blood pressure  -high cholesterol  -history of pancreatitis  -immune system problems  -infection (especially a virus infection such as chickenpox, cold sores, or herpes)  -liver disease  -low blood counts, like low white cell, platelet, or red cell counts  -multiple sclerosis  -recently received or scheduled to receive a vaccine  -scheduled to have surgery  -stomach ulcer  -stroke  -tuberculosis, a positive skin test for tuberculosis or have recently been in close contact with someone who has tuberculosis  -an unusual or allergic reaction to tocilizumab, other medicines, foods, dyes, or preservatives  -pregnant or trying to get pregnant  -breast-feeding  How should I use this medicine?  This medicine is for infusion into a vein or for injection under the skin. It is usually given by a health care professional in a hospital or clinic setting. If you get this medicine at home, you will be taught how to prepare and give this medicine. Use exactly as directed. Take your medicine at regular intervals. Do not take your medicine more often than directed.  It is important that you put your used needles and syringes in a special sharps container. Do not put them in a trash can. If you do not have a sharps container, call your pharmacist or healthcare provider to get one.  A special MedGuide will be given to you by  the pharmacist with each prescription and refill. Be sure to read this information carefully each time.  Talk to your pediatrician regarding the use of this medicine in children. While the drug may be prescribed for children as young as 2 years for selected conditions, precautions do apply.  Overdosage: If you think you have taken too much of this medicine contact a poison control center or emergency room at once.  NOTE: This medicine is only for you. Do not share this medicine with others.  What if I miss a dose?  It is important not to miss your dose. Call your doctor or health care professional if you are unable to keep an appointment. If you give yourself the medicine and you miss a dose, take it as soon as you can. If it is almost time for your next dose, take only that dose. Do not take double or extra doses.  What may interact with this medicine?  Do not take this medicine with any of the following medications:  -live virus vaccines  This medicine may also interact with the following medications:  -abatacept  -adalimumab  -anakinra  -atorvastatin  -certolizumab  -cyclosporine  -dextromethorphan  -etanercept  -golimumab  -infliximab  -lovastatin  -medicines that lower the immune system  -ofatumumab  -omeprazole  -oral contraceptives  -rituximab  -simvastatin  -steroid medicines like prednisone or cortisone  -theophylline  -tositumomab  -vaccines  -warfarin  This list may not describe all possible interactions. Give your health care   provider a list of all the medicines, herbs, non-prescription drugs, or dietary supplements you use. Also tell them if you smoke, drink alcohol, or use illegal drugs. Some items may interact with your medicine.  What should I watch for while using this medicine?  Tell your doctor or healthcare professional if your symptoms do not start to get better or if they get worse.  Your condition will be monitored carefully while you are receiving this medicine.  You will be tested for  tuberculosis (TB) before you start this medicine. If your doctor prescribes any medicine for TB, you should start taking the TB medicine before starting this medicine. Make sure to finish the full course of TB medicine.  Talk to your doctor about your risk of cancer. You may be more at risk for certain types of cancers if you take this medicine.  Call your doctor or health care professional for advice if you get a fever, chills or sore throat, or other symptoms of a cold or flu. Do not treat yourself. This drug decreases your body's ability to fight infections. Try to avoid being around people who are sick.  You may need blood work done while you are taking this medicine.  What side effects may I notice from receiving this medicine?  Side effects that you should report to your doctor or health care professional as soon as possible:  -allergic reactions like skin rash, itching or hives, swelling of the face, lips, or tongue  -breathing problems  -feeling faint or lightheaded, falls  -fever or chills, sore throat  -stomach pain  -unusual bleeding or bruising  -yellowing of the eyes or skin  Side effects that usually do not require medical attention (Report these to your doctor or health care professional if they continue or are bothersome.):  -dizziness  -headache  -pain, redness, or irritation at site where injected  This list may not describe all possible side effects. Call your doctor for medical advice about side effects. You may report side effects to FDA at 1-800-FDA-1088.  Where should I keep my medicine?  Keep out of the reach of children. If you are using this medicine at home, you will be instructed on how to store this medicine. Throw away any unused medicine after the expiration date on the label.  NOTE: This sheet is a summary. It may not cover all possible information. If you have questions about this medicine, talk to your doctor, pharmacist, or health care provider.     © 2016, [employer].  (2011-12-02 13:59:59)

## 2015-04-22 ENCOUNTER — Encounter (HOSPITAL_COMMUNITY): Payer: Medicare Other

## 2015-04-22 ENCOUNTER — Other Ambulatory Visit: Payer: Self-pay | Admitting: Internal Medicine

## 2015-04-22 DIAGNOSIS — M0570 Rheumatoid arthritis with rheumatoid factor of unspecified site without organ or systems involvement: Secondary | ICD-10-CM | POA: Diagnosis not present

## 2015-04-22 DIAGNOSIS — R921 Mammographic calcification found on diagnostic imaging of breast: Secondary | ICD-10-CM

## 2015-04-22 DIAGNOSIS — Z79899 Other long term (current) drug therapy: Secondary | ICD-10-CM | POA: Diagnosis not present

## 2015-04-24 DIAGNOSIS — M810 Age-related osteoporosis without current pathological fracture: Secondary | ICD-10-CM | POA: Diagnosis not present

## 2015-04-24 DIAGNOSIS — R030 Elevated blood-pressure reading, without diagnosis of hypertension: Secondary | ICD-10-CM | POA: Diagnosis not present

## 2015-04-24 DIAGNOSIS — M0689 Other specified rheumatoid arthritis, multiple sites: Secondary | ICD-10-CM | POA: Diagnosis not present

## 2015-04-24 DIAGNOSIS — Z6836 Body mass index (BMI) 36.0-36.9, adult: Secondary | ICD-10-CM | POA: Diagnosis not present

## 2015-04-24 DIAGNOSIS — Z1389 Encounter for screening for other disorder: Secondary | ICD-10-CM | POA: Diagnosis not present

## 2015-05-08 DIAGNOSIS — Z96652 Presence of left artificial knee joint: Secondary | ICD-10-CM | POA: Diagnosis not present

## 2015-05-08 DIAGNOSIS — S86812D Strain of other muscle(s) and tendon(s) at lower leg level, left leg, subsequent encounter: Secondary | ICD-10-CM | POA: Diagnosis not present

## 2015-05-08 DIAGNOSIS — Z471 Aftercare following joint replacement surgery: Secondary | ICD-10-CM | POA: Diagnosis not present

## 2015-05-13 ENCOUNTER — Encounter (HOSPITAL_COMMUNITY): Payer: Self-pay

## 2015-05-13 ENCOUNTER — Encounter (HOSPITAL_COMMUNITY)
Admission: RE | Admit: 2015-05-13 | Discharge: 2015-05-13 | Disposition: A | Discharge status: Home / Self Care | Payer: Medicare Other | Source: Ambulatory Visit | Attending: Rheumatology | Admitting: Rheumatology

## 2015-05-13 DIAGNOSIS — M069 Rheumatoid arthritis, unspecified: Secondary | ICD-10-CM | POA: Diagnosis not present

## 2015-05-13 MED ORDER — DIPHENHYDRAMINE HCL 50 MG/ML IJ SOLN
50.0000 mg | INTRAMUSCULAR | Status: DC
  Administered 2015-05-13: 50 mg via INTRAVENOUS
  Filled 2015-05-13: qty 1

## 2015-05-13 MED ORDER — TOCILIZUMAB 400 MG/20ML IV SOLN
400.0000 mg | INTRAVENOUS | Status: DC
  Administered 2015-05-13: 400 mg via INTRAVENOUS
  Filled 2015-05-13: qty 20

## 2015-05-13 MED ORDER — SODIUM CHLORIDE 0.9 % IV SOLN
INTRAVENOUS | Status: DC
  Administered 2015-05-13: 10:00:00 via INTRAVENOUS

## 2015-05-13 MED ORDER — ACETAMINOPHEN 500 MG PO TABS
1000.0000 mg | ORAL_TABLET | ORAL | Status: DC
Start: 2015-05-13 — End: 2015-05-22
  Filled 2015-05-13: qty 2

## 2015-05-13 NOTE — Discharge Instructions (Signed)
Tocilizumab injection  What is this medicine?  TOCILIZUMAB (TOE si LIZ ue mab) is used to treat rheumatoid arthritis (RA) and certain types of arthritis in children. This medicine helps reduce joint pain and swelling. This medicine is often used with other medicines.  This medicine may be used for other purposes; ask your health care provider or pharmacist if you have questions.  What should I tell my health care provider before I take this medicine?  They need to know if you have any of these conditions:  -cancer  -diabetes  -diverticulitis  -heart disease  -hepatitis B or history of hepatitis B infection  -high blood pressure  -high cholesterol  -history of pancreatitis  -immune system problems  -infection (especially a virus infection such as chickenpox, cold sores, or herpes)  -liver disease  -low blood counts, like low white cell, platelet, or red cell counts  -multiple sclerosis  -recently received or scheduled to receive a vaccine  -scheduled to have surgery  -stomach ulcer  -stroke  -tuberculosis, a positive skin test for tuberculosis or have recently been in close contact with someone who has tuberculosis  -an unusual or allergic reaction to tocilizumab, other medicines, foods, dyes, or preservatives  -pregnant or trying to get pregnant  -breast-feeding  How should I use this medicine?  This medicine is for infusion into a vein or for injection under the skin. It is usually given by a health care professional in a hospital or clinic setting. If you get this medicine at home, you will be taught how to prepare and give this medicine. Use exactly as directed. Take your medicine at regular intervals. Do not take your medicine more often than directed.  It is important that you put your used needles and syringes in a special sharps container. Do not put them in a trash can. If you do not have a sharps container, call your pharmacist or healthcare provider to get one.  A special MedGuide will be given to you by  the pharmacist with each prescription and refill. Be sure to read this information carefully each time.  Talk to your pediatrician regarding the use of this medicine in children. While the drug may be prescribed for children as young as 2 years for selected conditions, precautions do apply.  Overdosage: If you think you have taken too much of this medicine contact a poison control center or emergency room at once.  NOTE: This medicine is only for you. Do not share this medicine with others.  What if I miss a dose?  It is important not to miss your dose. Call your doctor or health care professional if you are unable to keep an appointment. If you give yourself the medicine and you miss a dose, take it as soon as you can. If it is almost time for your next dose, take only that dose. Do not take double or extra doses.  What may interact with this medicine?  Do not take this medicine with any of the following medications:  -live virus vaccines  This medicine may also interact with the following medications:  -abatacept  -adalimumab  -anakinra  -atorvastatin  -certolizumab  -cyclosporine  -dextromethorphan  -etanercept  -golimumab  -infliximab  -lovastatin  -medicines that lower the immune system  -ofatumumab  -omeprazole  -oral contraceptives  -rituximab  -simvastatin  -steroid medicines like prednisone or cortisone  -theophylline  -tositumomab  -vaccines  -warfarin  This list may not describe all possible interactions. Give your health care   provider a list of all the medicines, herbs, non-prescription drugs, or dietary supplements you use. Also tell them if you smoke, drink alcohol, or use illegal drugs. Some items may interact with your medicine.  What should I watch for while using this medicine?  Tell your doctor or healthcare professional if your symptoms do not start to get better or if they get worse.  Your condition will be monitored carefully while you are receiving this medicine.  You will be tested for  tuberculosis (TB) before you start this medicine. If your doctor prescribes any medicine for TB, you should start taking the TB medicine before starting this medicine. Make sure to finish the full course of TB medicine.  Talk to your doctor about your risk of cancer. You may be more at risk for certain types of cancers if you take this medicine.  Call your doctor or health care professional for advice if you get a fever, chills or sore throat, or other symptoms of a cold or flu. Do not treat yourself. This drug decreases your body's ability to fight infections. Try to avoid being around people who are sick.  You may need blood work done while you are taking this medicine.  What side effects may I notice from receiving this medicine?  Side effects that you should report to your doctor or health care professional as soon as possible:  -allergic reactions like skin rash, itching or hives, swelling of the face, lips, or tongue  -breathing problems  -feeling faint or lightheaded, falls  -fever or chills, sore throat  -stomach pain  -unusual bleeding or bruising  -yellowing of the eyes or skin  Side effects that usually do not require medical attention (Report these to your doctor or health care professional if they continue or are bothersome.):  -dizziness  -headache  -pain, redness, or irritation at site where injected  This list may not describe all possible side effects. Call your doctor for medical advice about side effects. You may report side effects to FDA at 1-800-FDA-1088.  Where should I keep my medicine?  Keep out of the reach of children. If you are using this medicine at home, you will be instructed on how to store this medicine. Throw away any unused medicine after the expiration date on the label.  NOTE: This sheet is a summary. It may not cover all possible information. If you have questions about this medicine, talk to your doctor, pharmacist, or health care provider.     © 2016, [employer].  (2011-12-02 13:59:59)

## 2015-05-27 ENCOUNTER — Ambulatory Visit
Admission: RE | Admit: 2015-05-27 | Discharge: 2015-05-27 | Disposition: A | Discharge status: Home / Self Care | Payer: Medicare Other | Source: Ambulatory Visit | Attending: Internal Medicine | Admitting: Internal Medicine

## 2015-05-27 DIAGNOSIS — R921 Mammographic calcification found on diagnostic imaging of breast: Secondary | ICD-10-CM | POA: Diagnosis not present

## 2015-05-27 DIAGNOSIS — Z79899 Other long term (current) drug therapy: Secondary | ICD-10-CM | POA: Diagnosis not present

## 2015-05-27 DIAGNOSIS — M79642 Pain in left hand: Secondary | ICD-10-CM | POA: Diagnosis not present

## 2015-05-27 DIAGNOSIS — M0589 Other rheumatoid arthritis with rheumatoid factor of multiple sites: Secondary | ICD-10-CM | POA: Diagnosis not present

## 2015-05-27 DIAGNOSIS — M25512 Pain in left shoulder: Secondary | ICD-10-CM | POA: Diagnosis not present

## 2015-05-27 DIAGNOSIS — M79641 Pain in right hand: Secondary | ICD-10-CM | POA: Diagnosis not present

## 2015-05-27 DIAGNOSIS — M25511 Pain in right shoulder: Secondary | ICD-10-CM | POA: Diagnosis not present

## 2015-06-17 ENCOUNTER — Encounter (HOSPITAL_COMMUNITY): Payer: Self-pay

## 2015-06-17 ENCOUNTER — Encounter (HOSPITAL_COMMUNITY)
Admission: RE | Admit: 2015-06-17 | Discharge: 2015-06-17 | Disposition: A | Discharge status: Home / Self Care | Payer: Medicare Other | Source: Ambulatory Visit | Attending: Rheumatology | Admitting: Rheumatology

## 2015-06-17 DIAGNOSIS — M069 Rheumatoid arthritis, unspecified: Secondary | ICD-10-CM | POA: Insufficient documentation

## 2015-06-17 MED ORDER — ACETAMINOPHEN 500 MG PO TABS
1000.0000 mg | ORAL_TABLET | ORAL | Status: DC | PRN
  Administered 2015-06-17: 1000 mg via ORAL
  Filled 2015-06-17: qty 2

## 2015-06-17 MED ORDER — TOCILIZUMAB 400 MG/20ML IV SOLN
400.0000 mg | INTRAVENOUS | Status: DC
  Administered 2015-06-17: 400 mg via INTRAVENOUS
  Filled 2015-06-17: qty 20

## 2015-06-17 MED ORDER — DIPHENHYDRAMINE HCL 50 MG/ML IJ SOLN
50.0000 mg | INTRAMUSCULAR | Status: DC | PRN
  Administered 2015-06-17: 50 mg via INTRAVENOUS
  Filled 2015-06-17: qty 1

## 2015-06-17 MED ORDER — SODIUM CHLORIDE 0.9 % IV SOLN
INTRAVENOUS | Status: DC
  Administered 2015-06-17: 12:00:00 via INTRAVENOUS

## 2015-06-17 NOTE — Discharge Instructions (Signed)
Actemra Tocilizumab injection What is this medicine? TOCILIZUMAB (TOE si LIZ ue mab) is used to treat rheumatoid arthritis (RA) and certain types of arthritis in children. This medicine helps reduce joint pain and swelling. This medicine is often used with other medicines. This medicine may be used for other purposes; ask your health care provider or pharmacist if you have questions. What should I tell my health care provider before I take this medicine? They need to know if you have any of these conditions: -cancer -diabetes -diverticulitis -heart disease -hepatitis B or history of hepatitis B infection -high blood pressure -high cholesterol -history of pancreatitis -immune system problems -infection (especially a virus infection such as chickenpox, cold sores, or herpes) -liver disease -low blood counts, like low white cell, platelet, or red cell counts -multiple sclerosis -recently received or scheduled to receive a vaccine -scheduled to have surgery -stomach ulcer -stroke -tuberculosis, a positive skin test for tuberculosis or have recently been in close contact with someone who has tuberculosis -an unusual or allergic reaction to tocilizumab, other medicines, foods, dyes, or preservatives -pregnant or trying to get pregnant -breast-feeding How should I use this medicine? This medicine is for infusion into a vein or for injection under the skin. It is usually given by a health care professional in a hospital or clinic setting. If you get this medicine at home, you will be taught how to prepare and give this medicine. Use exactly as directed. Take your medicine at regular intervals. Do not take your medicine more often than directed. It is important that you put your used needles and syringes in a special sharps container. Do not put them in a trash can. If you do not have a sharps container, call your pharmacist or healthcare provider to get one. A special MedGuide will be given  to you by the pharmacist with each prescription and refill. Be sure to read this information carefully each time. Talk to your pediatrician regarding the use of this medicine in children. While the drug may be prescribed for children as young as 2 years for selected conditions, precautions do apply. Overdosage: If you think you have taken too much of this medicine contact a poison control center or emergency room at once. NOTE: This medicine is only for you. Do not share this medicine with others. What if I miss a dose? It is important not to miss your dose. Call your doctor or health care professional if you are unable to keep an appointment. If you give yourself the medicine and you miss a dose, take it as soon as you can. If it is almost time for your next dose, take only that dose. Do not take double or extra doses. What may interact with this medicine? Do not take this medicine with any of the following medications: -live virus vaccines This medicine may also interact with the following medications: -abatacept -adalimumab -anakinra -atorvastatin -certolizumab -cyclosporine -dextromethorphan -etanercept -golimumab -infliximab -lovastatin -medicines that lower the immune system -ofatumumab -omeprazole -oral contraceptives -rituximab -simvastatin -steroid medicines like prednisone or cortisone -theophylline -tositumomab -vaccines -warfarin This list may not describe all possible interactions. Give your health care provider a list of all the medicines, herbs, non-prescription drugs, or dietary supplements you use. Also tell them if you smoke, drink alcohol, or use illegal drugs. Some items may interact with your medicine. What should I watch for while using this medicine? Tell your doctor or healthcare professional if your symptoms do not start to get better or if they  get worse. Your condition will be monitored carefully while you are receiving this medicine. You will be tested  for tuberculosis (TB) before you start this medicine. If your doctor prescribes any medicine for TB, you should start taking the TB medicine before starting this medicine. Make sure to finish the full course of TB medicine. Talk to your doctor about your risk of cancer. You may be more at risk for certain types of cancers if you take this medicine. Call your doctor or health care professional for advice if you get a fever, chills or sore throat, or other symptoms of a cold or flu. Do not treat yourself. This drug decreases your body's ability to fight infections. Try to avoid being around people who are sick. You may need blood work done while you are taking this medicine. What side effects may I notice from receiving this medicine? Side effects that you should report to your doctor or health care professional as soon as possible: -allergic reactions like skin rash, itching or hives, swelling of the face, lips, or tongue -breathing problems -feeling faint or lightheaded, falls -fever or chills, sore throat -stomach pain -unusual bleeding or bruising -yellowing of the eyes or skin Side effects that usually do not require medical attention (Report these to your doctor or health care professional if they continue or are bothersome.): -dizziness -headache -pain, redness, or irritation at site where injected This list may not describe all possible side effects. Call your doctor for medical advice about side effects. You may report side effects to FDA at 1-800-FDA-1088. Where should I keep my medicine? Keep out of the reach of children. If you are using this medicine at home, you will be instructed on how to store this medicine. Throw away any unused medicine after the expiration date on the label. NOTE: This sheet is a summary. It may not cover all possible information. If you have questions about this medicine, talk to your doctor, pharmacist, or health care provider.    2016, [employer]vier/Gold  Standard. (2011-12-02 13:59:59)

## 2015-07-15 ENCOUNTER — Encounter (HOSPITAL_COMMUNITY)
Admission: RE | Admit: 2015-07-15 | Discharge: 2015-07-15 | Disposition: A | Discharge status: Home / Self Care | Payer: Medicare Other | Source: Ambulatory Visit | Attending: Rheumatology | Admitting: Rheumatology

## 2015-07-15 ENCOUNTER — Encounter (HOSPITAL_COMMUNITY): Payer: Self-pay

## 2015-07-15 DIAGNOSIS — M069 Rheumatoid arthritis, unspecified: Secondary | ICD-10-CM | POA: Insufficient documentation

## 2015-07-15 MED ORDER — TOCILIZUMAB 400 MG/20ML IV SOLN
400.0000 mg | INTRAVENOUS | Status: DC
  Administered 2015-07-15: 400 mg via INTRAVENOUS
  Filled 2015-07-15: qty 20

## 2015-07-15 MED ORDER — DIPHENHYDRAMINE HCL 50 MG/ML IJ SOLN
50.0000 mg | INTRAMUSCULAR | Status: DC
  Administered 2015-07-15: 50 mg via INTRAVENOUS
  Filled 2015-07-15: qty 1

## 2015-07-15 MED ORDER — SODIUM CHLORIDE 0.9 % IV SOLN
INTRAVENOUS | Status: DC
  Administered 2015-07-15: 10:00:00 via INTRAVENOUS

## 2015-07-15 MED ORDER — ACETAMINOPHEN 500 MG PO TABS
1000.0000 mg | ORAL_TABLET | ORAL | Status: DC
  Administered 2015-07-15: 1000 mg via ORAL
  Filled 2015-07-15: qty 2

## 2015-08-05 DIAGNOSIS — M0589 Other rheumatoid arthritis with rheumatoid factor of multiple sites: Secondary | ICD-10-CM | POA: Diagnosis not present

## 2015-08-05 DIAGNOSIS — Z79899 Other long term (current) drug therapy: Secondary | ICD-10-CM | POA: Diagnosis not present

## 2015-08-06 DIAGNOSIS — S86812D Strain of other muscle(s) and tendon(s) at lower leg level, left leg, subsequent encounter: Secondary | ICD-10-CM | POA: Diagnosis not present

## 2015-08-06 DIAGNOSIS — Z4789 Encounter for other orthopedic aftercare: Secondary | ICD-10-CM | POA: Diagnosis not present

## 2015-08-12 ENCOUNTER — Encounter (HOSPITAL_COMMUNITY): Payer: Self-pay

## 2015-08-12 ENCOUNTER — Encounter (HOSPITAL_COMMUNITY)
Admission: RE | Admit: 2015-08-12 | Discharge: 2015-08-12 | Disposition: A | Discharge status: Home / Self Care | Payer: Medicare Other | Source: Ambulatory Visit | Attending: Rheumatology | Admitting: Rheumatology

## 2015-08-12 DIAGNOSIS — M069 Rheumatoid arthritis, unspecified: Secondary | ICD-10-CM | POA: Insufficient documentation

## 2015-08-12 MED ORDER — ACETAMINOPHEN 500 MG PO TABS: 1000.0000 mg | ORAL_TABLET | ORAL | Status: DC

## 2015-08-12 MED ORDER — DIPHENHYDRAMINE HCL 50 MG/ML IJ SOLN
50.0000 mg | INTRAMUSCULAR | Status: DC
  Administered 2015-08-12: 50 mg via INTRAVENOUS
  Filled 2015-08-12: qty 1

## 2015-08-12 MED ORDER — SODIUM CHLORIDE 0.9 % IV SOLN
INTRAVENOUS | Status: AC
  Administered 2015-08-12: 10:00:00 via INTRAVENOUS

## 2015-08-12 MED ORDER — TOCILIZUMAB 400 MG/20ML IV SOLN: 400.0000 mg | INTRAVENOUS | Status: DC

## 2015-08-12 MED ORDER — DIPHENHYDRAMINE HCL 50 MG/ML IJ SOLN: 50.0000 mg | INTRAMUSCULAR | Status: DC

## 2015-08-12 MED ORDER — TOCILIZUMAB 400 MG/20ML IV SOLN
400.0000 mg | INTRAVENOUS | Status: AC
  Administered 2015-08-12: 400 mg via INTRAVENOUS
  Filled 2015-08-12: qty 20

## 2015-08-14 DIAGNOSIS — M069 Rheumatoid arthritis, unspecified: Secondary | ICD-10-CM | POA: Diagnosis not present

## 2015-09-09 ENCOUNTER — Encounter (HOSPITAL_COMMUNITY)
Admission: RE | Admit: 2015-09-09 | Discharge: 2015-09-09 | Disposition: A | Discharge status: Home / Self Care | Payer: Medicare Other | Source: Ambulatory Visit | Attending: Rheumatology | Admitting: Rheumatology

## 2015-09-09 ENCOUNTER — Encounter (HOSPITAL_COMMUNITY): Payer: Self-pay

## 2015-09-09 DIAGNOSIS — M069 Rheumatoid arthritis, unspecified: Secondary | ICD-10-CM | POA: Diagnosis not present

## 2015-09-09 MED ORDER — DIPHENHYDRAMINE HCL 50 MG/ML IJ SOLN
50.0000 mg | INTRAMUSCULAR | Status: DC | PRN
Start: 2015-09-09 — End: 2015-09-10
  Administered 2015-09-09: 50 mg via INTRAVENOUS
  Filled 2015-09-09: qty 1

## 2015-09-09 MED ORDER — ACETAMINOPHEN 500 MG PO TABS
1000.0000 mg | ORAL_TABLET | ORAL | Status: DC | PRN
  Administered 2015-09-09: 500 mg via ORAL
  Filled 2015-09-09: qty 2

## 2015-09-09 MED ORDER — SODIUM CHLORIDE 0.9 % IV SOLN
INTRAVENOUS | Status: AC
  Administered 2015-09-09: 11:00:00 via INTRAVENOUS

## 2015-09-09 MED ORDER — TOCILIZUMAB 400 MG/20ML IV SOLN
400.0000 mg | INTRAVENOUS | Status: DC
Start: 2015-09-09 — End: 2015-09-10
  Administered 2015-09-09: 400 mg via INTRAVENOUS
  Filled 2015-09-09: qty 20

## 2015-09-09 NOTE — Discharge Instructions (Signed)
Tocilizumab injection  What is this medicine?  TOCILIZUMAB (TOE si LIZ ue mab) is used to treat rheumatoid arthritis (RA) and certain types of arthritis in children. This medicine helps reduce joint pain and swelling. This medicine is often used with other medicines.  This medicine may be used for other purposes; ask your health care provider or pharmacist if you have questions.  What should I tell my health care provider before I take this medicine?  They need to know if you have any of these conditions:  -cancer  -diabetes  -diverticulitis  -heart disease  -hepatitis B or history of hepatitis B infection  -high blood pressure  -high cholesterol  -history of pancreatitis  -immune system problems  -infection (especially a virus infection such as chickenpox, cold sores, or herpes)  -liver disease  -low blood counts, like low white cell, platelet, or red cell counts  -multiple sclerosis  -recently received or scheduled to receive a vaccine  -scheduled to have surgery  -stomach ulcer  -stroke  -tuberculosis, a positive skin test for tuberculosis or have recently been in close contact with someone who has tuberculosis  -an unusual or allergic reaction to tocilizumab, other medicines, foods, dyes, or preservatives  -pregnant or trying to get pregnant  -breast-feeding  How should I use this medicine?  This medicine is for infusion into a vein or for injection under the skin. It is usually given by a health care professional in a hospital or clinic setting. If you get this medicine at home, you will be taught how to prepare and give this medicine. Use exactly as directed. Take your medicine at regular intervals. Do not take your medicine more often than directed.  It is important that you put your used needles and syringes in a special sharps container. Do not put them in a trash can. If you do not have a sharps container, call your pharmacist or healthcare provider to get one.  A special MedGuide will be given to you by  the pharmacist with each prescription and refill. Be sure to read this information carefully each time.  Talk to your pediatrician regarding the use of this medicine in children. While the drug may be prescribed for children as young as 2 years for selected conditions, precautions do apply.  Overdosage: If you think you have taken too much of this medicine contact a poison control center or emergency room at once.  NOTE: This medicine is only for you. Do not share this medicine with others.  What if I miss a dose?  It is important not to miss your dose. Call your doctor or health care professional if you are unable to keep an appointment. If you give yourself the medicine and you miss a dose, take it as soon as you can. If it is almost time for your next dose, take only that dose. Do not take double or extra doses.  What may interact with this medicine?  Do not take this medicine with any of the following medications:  -live virus vaccines  This medicine may also interact with the following medications:  -abatacept  -adalimumab  -anakinra  -atorvastatin  -certolizumab  -cyclosporine  -dextromethorphan  -etanercept  -golimumab  -infliximab  -lovastatin  -medicines that lower the immune system  -ofatumumab  -omeprazole  -oral contraceptives  -rituximab  -simvastatin  -steroid medicines like prednisone or cortisone  -theophylline  -tositumomab  -vaccines  -warfarin  This list may not describe all possible interactions. Give your health care   provider a list of all the medicines, herbs, non-prescription drugs, or dietary supplements you use. Also tell them if you smoke, drink alcohol, or use illegal drugs. Some items may interact with your medicine.  What should I watch for while using this medicine?  Tell your doctor or healthcare professional if your symptoms do not start to get better or if they get worse.  Your condition will be monitored carefully while you are receiving this medicine.  You will be tested for  tuberculosis (TB) before you start this medicine. If your doctor prescribes any medicine for TB, you should start taking the TB medicine before starting this medicine. Make sure to finish the full course of TB medicine.  Talk to your doctor about your risk of cancer. You may be more at risk for certain types of cancers if you take this medicine.  Call your doctor or health care professional for advice if you get a fever, chills or sore throat, or other symptoms of a cold or flu. Do not treat yourself. This drug decreases your body's ability to fight infections. Try to avoid being around people who are sick.  You may need blood work done while you are taking this medicine.  What side effects may I notice from receiving this medicine?  Side effects that you should report to your doctor or health care professional as soon as possible:  -allergic reactions like skin rash, itching or hives, swelling of the face, lips, or tongue  -breathing problems  -feeling faint or lightheaded, falls  -fever or chills, sore throat  -stomach pain  -unusual bleeding or bruising  -yellowing of the eyes or skin  Side effects that usually do not require medical attention (Report these to your doctor or health care professional if they continue or are bothersome.):  -dizziness  -headache  -pain, redness, or irritation at site where injected  This list may not describe all possible side effects. Call your doctor for medical advice about side effects. You may report side effects to FDA at 1-800-FDA-1088.  Where should I keep my medicine?  Keep out of the reach of children. If you are using this medicine at home, you will be instructed on how to store this medicine. Throw away any unused medicine after the expiration date on the label.  NOTE: This sheet is a summary. It may not cover all possible information. If you have questions about this medicine, talk to your doctor, pharmacist, or health care provider.     © 2016, [employer].  (2011-12-02 13:59:59)

## 2015-09-23 DIAGNOSIS — M25562 Pain in left knee: Secondary | ICD-10-CM | POA: Diagnosis not present

## 2015-09-23 DIAGNOSIS — M79642 Pain in left hand: Secondary | ICD-10-CM | POA: Diagnosis not present

## 2015-09-23 DIAGNOSIS — Z79899 Other long term (current) drug therapy: Secondary | ICD-10-CM | POA: Diagnosis not present

## 2015-09-23 DIAGNOSIS — M79641 Pain in right hand: Secondary | ICD-10-CM | POA: Diagnosis not present

## 2015-09-23 DIAGNOSIS — M0570 Rheumatoid arthritis with rheumatoid factor of unspecified site without organ or systems involvement: Secondary | ICD-10-CM | POA: Diagnosis not present

## 2015-10-07 ENCOUNTER — Encounter (HOSPITAL_COMMUNITY): Payer: Self-pay

## 2015-10-07 ENCOUNTER — Encounter (HOSPITAL_COMMUNITY)
Admission: RE | Admit: 2015-10-07 | Discharge: 2015-10-07 | Disposition: A | Discharge status: Home / Self Care | Payer: Medicare Other | Source: Ambulatory Visit | Attending: Rheumatology | Admitting: Rheumatology

## 2015-10-07 DIAGNOSIS — M069 Rheumatoid arthritis, unspecified: Secondary | ICD-10-CM | POA: Diagnosis not present

## 2015-10-07 MED ORDER — DIPHENHYDRAMINE HCL 50 MG/ML IJ SOLN
50.0000 mg | INTRAMUSCULAR | Status: DC | PRN
  Administered 2015-10-07: 50 mg via INTRAVENOUS
  Filled 2015-10-07: qty 1

## 2015-10-07 MED ORDER — TOCILIZUMAB 400 MG/20ML IV SOLN
400.0000 mg | INTRAVENOUS | Status: DC
  Administered 2015-10-07: 400 mg via INTRAVENOUS
  Filled 2015-10-07: qty 20

## 2015-10-07 MED ORDER — ACETAMINOPHEN 500 MG PO TABS
1000.0000 mg | ORAL_TABLET | ORAL | Status: DC | PRN
Start: 2015-10-07 — End: 2015-10-08
  Administered 2015-10-07: 1000 mg via ORAL
  Filled 2015-10-07: qty 2

## 2015-10-07 MED ORDER — SODIUM CHLORIDE 0.9 % IV SOLN
INTRAVENOUS | Status: AC
  Administered 2015-10-07: 10:00:00 via INTRAVENOUS

## 2015-10-28 DIAGNOSIS — Z85828 Personal history of other malignant neoplasm of skin: Secondary | ICD-10-CM | POA: Diagnosis not present

## 2015-10-28 DIAGNOSIS — Z23 Encounter for immunization: Secondary | ICD-10-CM | POA: Diagnosis not present

## 2015-10-28 DIAGNOSIS — L821 Other seborrheic keratosis: Secondary | ICD-10-CM | POA: Diagnosis not present

## 2015-10-28 DIAGNOSIS — L570 Actinic keratosis: Secondary | ICD-10-CM | POA: Diagnosis not present

## 2015-10-30 ENCOUNTER — Other Ambulatory Visit: Payer: Self-pay | Admitting: Internal Medicine

## 2015-10-30 DIAGNOSIS — R921 Mammographic calcification found on diagnostic imaging of breast: Secondary | ICD-10-CM

## 2015-11-04 ENCOUNTER — Encounter (HOSPITAL_COMMUNITY): Payer: Self-pay

## 2015-11-04 ENCOUNTER — Encounter (HOSPITAL_COMMUNITY)
Admission: RE | Admit: 2015-11-04 | Discharge: 2015-11-04 | Disposition: A | Discharge status: Home / Self Care | Payer: Medicare Other | Source: Ambulatory Visit | Attending: Rheumatology | Admitting: Rheumatology

## 2015-11-04 DIAGNOSIS — M069 Rheumatoid arthritis, unspecified: Secondary | ICD-10-CM | POA: Insufficient documentation

## 2015-11-04 MED ORDER — DIPHENHYDRAMINE HCL 50 MG/ML IJ SOLN
50.0000 mg | INTRAMUSCULAR | Status: DC | PRN
Start: 2015-11-04 — End: 2015-11-05
  Administered 2015-11-04: 50 mg via INTRAVENOUS
  Filled 2015-11-04: qty 1

## 2015-11-04 MED ORDER — TOCILIZUMAB 400 MG/20ML IV SOLN
400.0000 mg | INTRAVENOUS | Status: DC
  Administered 2015-11-04: 400 mg via INTRAVENOUS
  Filled 2015-11-04: qty 20

## 2015-11-04 MED ORDER — SODIUM CHLORIDE 0.9 % IV SOLN
INTRAVENOUS | Status: DC | PRN
  Administered 2015-11-04: 09:00:00 via INTRAVENOUS

## 2015-11-04 MED ORDER — ACETAMINOPHEN 500 MG PO TABS: 1000.0000 mg | ORAL_TABLET | ORAL | Status: DC | PRN

## 2015-11-05 DIAGNOSIS — Z471 Aftercare following joint replacement surgery: Secondary | ICD-10-CM | POA: Diagnosis not present

## 2015-11-05 DIAGNOSIS — Z96652 Presence of left artificial knee joint: Secondary | ICD-10-CM | POA: Diagnosis not present

## 2015-11-05 DIAGNOSIS — S86812D Strain of other muscle(s) and tendon(s) at lower leg level, left leg, subsequent encounter: Secondary | ICD-10-CM | POA: Diagnosis not present

## 2015-11-05 DIAGNOSIS — Z4889 Encounter for other specified surgical aftercare: Secondary | ICD-10-CM | POA: Diagnosis not present

## 2015-11-09 DIAGNOSIS — M810 Age-related osteoporosis without current pathological fracture: Secondary | ICD-10-CM | POA: Diagnosis not present

## 2015-11-09 DIAGNOSIS — H524 Presbyopia: Secondary | ICD-10-CM | POA: Diagnosis not present

## 2015-11-09 DIAGNOSIS — N390 Urinary tract infection, site not specified: Secondary | ICD-10-CM | POA: Diagnosis not present

## 2015-11-09 DIAGNOSIS — H2513 Age-related nuclear cataract, bilateral: Secondary | ICD-10-CM | POA: Diagnosis not present

## 2015-11-09 DIAGNOSIS — R8299 Other abnormal findings in urine: Secondary | ICD-10-CM | POA: Diagnosis not present

## 2015-11-09 DIAGNOSIS — E784 Other hyperlipidemia: Secondary | ICD-10-CM | POA: Diagnosis not present

## 2015-11-16 DIAGNOSIS — R7301 Impaired fasting glucose: Secondary | ICD-10-CM | POA: Diagnosis not present

## 2015-11-16 DIAGNOSIS — R011 Cardiac murmur, unspecified: Secondary | ICD-10-CM | POA: Diagnosis not present

## 2015-11-16 DIAGNOSIS — Z0000 Encounter for general adult medical examination without abnormal findings: Secondary | ICD-10-CM | POA: Diagnosis not present

## 2015-11-16 DIAGNOSIS — M069 Rheumatoid arthritis, unspecified: Secondary | ICD-10-CM | POA: Diagnosis not present

## 2015-11-16 DIAGNOSIS — M25562 Pain in left knee: Secondary | ICD-10-CM | POA: Diagnosis not present

## 2015-11-16 DIAGNOSIS — B0089 Other herpesviral infection: Secondary | ICD-10-CM | POA: Diagnosis not present

## 2015-11-16 DIAGNOSIS — R030 Elevated blood-pressure reading, without diagnosis of hypertension: Secondary | ICD-10-CM | POA: Diagnosis not present

## 2015-11-16 DIAGNOSIS — E784 Other hyperlipidemia: Secondary | ICD-10-CM | POA: Diagnosis not present

## 2015-11-16 DIAGNOSIS — Z1231 Encounter for screening mammogram for malignant neoplasm of breast: Secondary | ICD-10-CM | POA: Diagnosis not present

## 2015-11-16 DIAGNOSIS — Z6836 Body mass index (BMI) 36.0-36.9, adult: Secondary | ICD-10-CM | POA: Diagnosis not present

## 2015-11-16 DIAGNOSIS — Z1389 Encounter for screening for other disorder: Secondary | ICD-10-CM | POA: Diagnosis not present

## 2015-11-16 DIAGNOSIS — J3089 Other allergic rhinitis: Secondary | ICD-10-CM | POA: Diagnosis not present

## 2015-11-18 ENCOUNTER — Ambulatory Visit
Admission: RE | Admit: 2015-11-18 | Discharge: 2015-11-18 | Disposition: A | Discharge status: Home / Self Care | Payer: Medicare Other | Source: Ambulatory Visit | Attending: Internal Medicine | Admitting: Internal Medicine

## 2015-11-18 DIAGNOSIS — R921 Mammographic calcification found on diagnostic imaging of breast: Secondary | ICD-10-CM | POA: Diagnosis not present

## 2015-12-02 ENCOUNTER — Encounter (HOSPITAL_COMMUNITY)
Admission: RE | Admit: 2015-12-02 | Discharge: 2015-12-02 | Disposition: A | Discharge status: Home / Self Care | Payer: Medicare Other | Source: Ambulatory Visit | Attending: Rheumatology | Admitting: Rheumatology

## 2015-12-02 ENCOUNTER — Encounter (HOSPITAL_COMMUNITY): Payer: Self-pay

## 2015-12-02 DIAGNOSIS — M069 Rheumatoid arthritis, unspecified: Secondary | ICD-10-CM | POA: Diagnosis not present

## 2015-12-02 MED ORDER — ACETAMINOPHEN 500 MG PO TABS
1000.0000 mg | ORAL_TABLET | ORAL | Status: DC | PRN
  Administered 2015-12-02: 500 mg via ORAL
  Filled 2015-12-02: qty 2

## 2015-12-02 MED ORDER — TOCILIZUMAB 400 MG/20ML IV SOLN
400.0000 mg | INTRAVENOUS | Status: AC
  Administered 2015-12-02: 400 mg via INTRAVENOUS
  Filled 2015-12-02: qty 20

## 2015-12-02 MED ORDER — DIPHENHYDRAMINE HCL 50 MG/ML IJ SOLN
50.0000 mg | INTRAMUSCULAR | Status: DC | PRN
  Administered 2015-12-02: 50 mg via INTRAVENOUS
  Filled 2015-12-02: qty 1

## 2015-12-02 MED ORDER — SODIUM CHLORIDE 0.9 % IV SOLN
INTRAVENOUS | Status: DC | PRN
  Administered 2015-12-02: 09:00:00 via INTRAVENOUS

## 2016-01-07 ENCOUNTER — Encounter (HOSPITAL_COMMUNITY): Payer: Self-pay

## 2016-01-07 ENCOUNTER — Encounter (HOSPITAL_COMMUNITY)
Admission: RE | Admit: 2016-01-07 | Discharge: 2016-01-07 | Disposition: A | Discharge status: Home / Self Care | Payer: Medicare Other | Source: Ambulatory Visit | Attending: Rheumatology | Admitting: Rheumatology

## 2016-01-07 DIAGNOSIS — M069 Rheumatoid arthritis, unspecified: Secondary | ICD-10-CM | POA: Diagnosis not present

## 2016-01-07 MED ORDER — TOCILIZUMAB 400 MG/20ML IV SOLN
400.0000 mg | INTRAVENOUS | Status: DC
  Administered 2016-01-07: 400 mg via INTRAVENOUS
  Filled 2016-01-07: qty 20

## 2016-01-07 MED ORDER — DIPHENHYDRAMINE HCL 50 MG/ML IJ SOLN
50.0000 mg | INTRAMUSCULAR | Status: DC
  Administered 2016-01-07: 50 mg via INTRAVENOUS
  Filled 2016-01-07: qty 1

## 2016-01-07 MED ORDER — ACETAMINOPHEN 500 MG PO TABS
1000.0000 mg | ORAL_TABLET | ORAL | Status: DC
Start: 2016-01-07 — End: 2016-01-08
  Administered 2016-01-07: 1000 mg via ORAL
  Filled 2016-01-07: qty 2

## 2016-01-07 MED ORDER — SODIUM CHLORIDE 0.9 % IV SOLN
INTRAVENOUS | Status: DC
  Administered 2016-01-07: 09:00:00 via INTRAVENOUS

## 2016-01-27 DIAGNOSIS — Z79899 Other long term (current) drug therapy: Secondary | ICD-10-CM | POA: Diagnosis not present

## 2016-01-27 DIAGNOSIS — M25512 Pain in left shoulder: Secondary | ICD-10-CM | POA: Diagnosis not present

## 2016-01-27 DIAGNOSIS — M25562 Pain in left knee: Secondary | ICD-10-CM | POA: Diagnosis not present

## 2016-01-27 DIAGNOSIS — M0570 Rheumatoid arthritis with rheumatoid factor of unspecified site without organ or systems involvement: Secondary | ICD-10-CM | POA: Diagnosis not present

## 2016-02-05 ENCOUNTER — Encounter (HOSPITAL_COMMUNITY): Payer: Medicare Other

## 2016-02-08 HISTORY — PX: BREAST BIOPSY: SHX20

## 2016-02-10 ENCOUNTER — Encounter (HOSPITAL_COMMUNITY)
Admission: RE | Admit: 2016-02-10 | Discharge: 2016-02-10 | Disposition: A | Discharge status: Home / Self Care | Payer: Medicare Other | Source: Ambulatory Visit | Attending: Rheumatology | Admitting: Rheumatology

## 2016-02-10 ENCOUNTER — Encounter (HOSPITAL_COMMUNITY): Payer: Self-pay

## 2016-02-10 DIAGNOSIS — M069 Rheumatoid arthritis, unspecified: Secondary | ICD-10-CM | POA: Diagnosis not present

## 2016-02-10 LAB — COMPREHENSIVE METABOLIC PANEL
ALBUMIN: 3.9 g/dL (ref 3.5–5.0)
ALK PHOS: 46 U/L (ref 38–126)
ALT: 12 U/L — ABNORMAL LOW (ref 14–54)
ANION GAP: 7 (ref 5–15)
AST: 16 U/L (ref 15–41)
BUN: 11 mg/dL (ref 6–20)
CALCIUM: 8.7 mg/dL — AB (ref 8.9–10.3)
CO2: 28 mmol/L (ref 22–32)
Chloride: 102 mmol/L (ref 101–111)
Creatinine, Ser: 0.44 mg/dL (ref 0.44–1.00)
GFR calc non Af Amer: >60 mL/min (ref >60)
GLUCOSE: 115 mg/dL — AB (ref 65–99)
POTASSIUM: 4 mmol/L (ref 3.5–5.1)
SODIUM: 137 mmol/L (ref 135–145)
Total Bilirubin: 0.7 mg/dL (ref 0.3–1.2)
Total Protein: 6.4 g/dL — ABNORMAL LOW (ref 6.5–8.1)

## 2016-02-10 LAB — CBC WITH DIFFERENTIAL/PLATELET
BASOS ABS: 0 10*3/uL (ref 0.0–0.1)
BASOS PCT: 1 %
Eosinophils Absolute: 0.2 10*3/uL (ref 0.0–0.7)
Eosinophils Relative: 3 %
HEMATOCRIT: 39.5 % (ref 36.0–46.0)
HEMOGLOBIN: 13 g/dL (ref 12.0–15.0)
LYMPHS PCT: 15 %
Lymphs Abs: 1.3 10*3/uL (ref 0.7–4.0)
MCH: 32.6 pg (ref 26.0–34.0)
MCHC: 32.9 g/dL (ref 30.0–36.0)
MCV: 99 fL (ref 78.0–100.0)
MONOS PCT: 10 %
Monocytes Absolute: 0.9 10*3/uL (ref 0.1–1.0)
NEUTROS ABS: 5.9 10*3/uL (ref 1.7–7.7)
NEUTROS PCT: 71 %
Platelets: 207 10*3/uL (ref 150–400)
RBC: 3.99 MIL/uL (ref 3.87–5.11)
RDW: 12.9 % (ref 11.5–15.5)
WBC: 8.3 10*3/uL (ref 4.0–10.5)

## 2016-02-10 MED ORDER — TOCILIZUMAB 400 MG/20ML IV SOLN
400.0000 mg | INTRAVENOUS | Status: DC
  Administered 2016-02-10: 400 mg via INTRAVENOUS
  Filled 2016-02-10: qty 20

## 2016-02-10 MED ORDER — ACETAMINOPHEN 500 MG PO TABS: 1000.0000 mg | ORAL_TABLET | ORAL | Status: DC

## 2016-02-10 MED ORDER — DIPHENHYDRAMINE HCL 50 MG/ML IJ SOLN
50.0000 mg | INTRAMUSCULAR | Status: DC
  Administered 2016-02-10: 50 mg via INTRAVENOUS
  Filled 2016-02-10: qty 1

## 2016-02-10 MED ORDER — SODIUM CHLORIDE 0.9 % IV SOLN
INTRAVENOUS | Status: DC
  Administered 2016-02-10: 11:00:00 via INTRAVENOUS

## 2016-02-10 NOTE — Progress Notes (Signed)
Patient arrived with written order from Dr. Charlestine Night for labs CBC and BMP.  IV team obtained labs and started PIV.  Patient tolerated well with no issues noted.

## 2016-02-12 DIAGNOSIS — Z471 Aftercare following joint replacement surgery: Secondary | ICD-10-CM | POA: Diagnosis not present

## 2016-02-12 DIAGNOSIS — Z96652 Presence of left artificial knee joint: Secondary | ICD-10-CM | POA: Diagnosis not present

## 2016-03-09 ENCOUNTER — Encounter (HOSPITAL_COMMUNITY): Admission: RE | Admit: 2016-03-09 | Payer: Medicare Other | Source: Ambulatory Visit

## 2016-03-28 DIAGNOSIS — M810 Age-related osteoporosis without current pathological fracture: Secondary | ICD-10-CM | POA: Diagnosis not present

## 2016-04-06 ENCOUNTER — Encounter (HOSPITAL_COMMUNITY)
Admission: RE | Admit: 2016-04-06 | Discharge: 2016-04-06 | Disposition: A | Discharge status: Home / Self Care | Payer: Medicare Other | Source: Ambulatory Visit | Attending: Rheumatology | Admitting: Rheumatology

## 2016-04-06 ENCOUNTER — Encounter (HOSPITAL_COMMUNITY): Payer: Self-pay

## 2016-04-06 DIAGNOSIS — M069 Rheumatoid arthritis, unspecified: Secondary | ICD-10-CM | POA: Insufficient documentation

## 2016-04-06 MED ORDER — ACETAMINOPHEN 500 MG PO TABS: 1000.0000 mg | ORAL_TABLET | ORAL | Status: DC | PRN

## 2016-04-06 MED ORDER — SODIUM CHLORIDE 0.9 % IV SOLN
INTRAVENOUS | Status: DC
  Administered 2016-04-06: 10:00:00 via INTRAVENOUS

## 2016-04-06 MED ORDER — DIPHENHYDRAMINE HCL 50 MG/ML IJ SOLN
50.0000 mg | INTRAMUSCULAR | Status: DC
  Administered 2016-04-06: 50 mg via INTRAVENOUS
  Filled 2016-04-06: qty 1

## 2016-04-06 MED ORDER — TOCILIZUMAB 400 MG/20ML IV SOLN
400.0000 mg | INTRAVENOUS | Status: DC
  Administered 2016-04-06: 400 mg via INTRAVENOUS
  Filled 2016-04-06: qty 20

## 2016-04-06 NOTE — Discharge Instructions (Signed)
Tocilizumab injection/Acetemra Infusion What is this medicine? TOCILIZUMAB (TOE si LIZ ue mab) is a monoclonal antibody. It is used to treat rheumatoid arthritis, juvenile idiopathic arthritis, giant cell arteritis, and cytokine release syndrome due to chimeric antigen receptor (CAR) T-cell treatment. This medicine may be used for other purposes; ask your health care provider or pharmacist if you have questions. COMMON BRAND NAME(S): Actemra What should I tell my health care provider before I take this medicine? They need to know if you have any of these conditions: -cancer -diabetes -diverticulitis -heart disease -hepatitis B or history of hepatitis B infection -high blood pressure -high cholesterol -history of pancreatitis -immune system problems -infection (especially a virus infection such as chickenpox, cold sores, or herpes) -liver disease -low blood counts, like low white cell, platelet, or red cell counts -multiple sclerosis -recently received or scheduled to receive a vaccine -scheduled to have surgery -stomach ulcer -stroke -tuberculosis, a positive skin test for tuberculosis or have recently been in close contact with someone who has tuberculosis -an unusual or allergic reaction to tocilizumab, other medicines, foods, dyes, or preservatives -pregnant or trying to get pregnant -breast-feeding How should I use this medicine? This medicine is for infusion into a vein or for injection under the skin. It is usually given by a health care professional in a hospital or clinic setting. If you get this medicine at home, you will be taught how to prepare and give this medicine. Use exactly as directed. Take your medicine at regular intervals. Do not take your medicine more often than directed. It is important that you put your used needles and syringes in a special sharps container. Do not put them in a trash can. If you do not have a sharps container, call your pharmacist or  healthcare provider to get one. A special MedGuide will be given to you by the pharmacist with each prescription and refill. Be sure to read this information carefully each time. Talk to your pediatrician regarding the use of this medicine in children. While the drug may be prescribed for children as young as 2 years for selected conditions, precautions do apply. Overdosage: If you think you have taken too much of this medicine contact a poison control center or emergency room at once. NOTE: This medicine is only for you. Do not share this medicine with others. What if I miss a dose? It is important not to miss your dose. Call your doctor or health care professional if you are unable to keep an appointment. If you give yourself the medicine and you miss a dose, take it as soon as you can. If it is almost time for your next dose, take only that dose. Do not take double or extra doses. What may interact with this medicine? Do not take this medicine with any of the following medications: -live virus vaccines This medicine may also interact with the following medications: -abatacept -adalimumab -anakinra -atorvastatin -certolizumab -cyclosporine -dextromethorphan -etanercept -golimumab -infliximab -lovastatin -medicines that lower the immune system -ofatumumab -omeprazole -oral contraceptives -rituximab -simvastatin -steroid medicines like prednisone or cortisone -theophylline -tositumomab -vaccines -warfarin This list may not describe all possible interactions. Give your health care provider a list of all the medicines, herbs, non-prescription drugs, or dietary supplements you use. Also tell them if you smoke, drink alcohol, or use illegal drugs. Some items may interact with your medicine. What should I watch for while using this medicine? Tell your doctor or healthcare professional if your symptoms do not start to get better  or if they get worse. Your condition will be monitored  carefully while you are receiving this medicine. You will be tested for tuberculosis (TB) before you start this medicine. If your doctor prescribes any medicine for TB, you should start taking the TB medicine before starting this medicine. Make sure to finish the full course of TB medicine. Talk to your doctor about your risk of cancer. You may be more at risk for certain types of cancers if you take this medicine. Call your doctor or health care professional for advice if you get a fever, chills or sore throat, or other symptoms of a cold or flu. Do not treat yourself. This drug decreases your body's ability to fight infections. Try to avoid being around people who are sick. You may need blood work done while you are taking this medicine. What side effects may I notice from receiving this medicine? Side effects that you should report to your doctor or health care professional as soon as possible: -allergic reactions like skin rash, itching or hives, swelling of the face, lips, or tongue -breathing problems -feeling faint or lightheaded, falls -fever or chills, sore throat -stomach pain -unusual bleeding or bruising -yellowing of the eyes or skin Side effects that usually do not require medical attention (report to your doctor or health care professional if they continue or are bothersome): -dizziness -headache -pain, redness, or irritation at site where injected This list may not describe all possible side effects. Call your doctor for medical advice about side effects. You may report side effects to FDA at 1-800-FDA-1088. Where should I keep my medicine? Keep out of the reach of children. If you are using this medicine at home, you will be instructed on how to store this medicine. Throw away any unused medicine after the expiration date on the label. NOTE: This sheet is a summary. It may not cover all possible information. If you have questions about this medicine, talk to your doctor,  pharmacist, or health care provider.  2018 [employer]/Gold Standard (2015-10-13 12:55:30)

## 2016-05-04 ENCOUNTER — Encounter (HOSPITAL_COMMUNITY): Payer: Self-pay

## 2016-05-04 ENCOUNTER — Encounter (HOSPITAL_COMMUNITY)
Admission: RE | Admit: 2016-05-04 | Discharge: 2016-05-04 | Disposition: A | Discharge status: Home / Self Care | Payer: Medicare Other | Source: Ambulatory Visit | Attending: Rheumatology | Admitting: Rheumatology

## 2016-05-04 DIAGNOSIS — M069 Rheumatoid arthritis, unspecified: Secondary | ICD-10-CM | POA: Insufficient documentation

## 2016-05-04 MED ORDER — ACETAMINOPHEN 500 MG PO TABS: 1000.0000 mg | ORAL_TABLET | Freq: Once | ORAL | Status: DC | PRN

## 2016-05-04 MED ORDER — TOCILIZUMAB 400 MG/20ML IV SOLN
400.0000 mg | INTRAVENOUS | Status: DC
  Administered 2016-05-04: 400 mg via INTRAVENOUS
  Filled 2016-05-04: qty 20

## 2016-05-04 MED ORDER — DIPHENHYDRAMINE HCL 50 MG/ML IJ SOLN
INTRAMUSCULAR | Status: AC
  Administered 2016-05-04: 50 mg
  Filled 2016-05-04: qty 1

## 2016-05-04 MED ORDER — DIPHENHYDRAMINE HCL 50 MG/ML IJ SOLN: 50.0000 mg | INTRAMUSCULAR | Status: DC

## 2016-05-04 MED ORDER — SODIUM CHLORIDE 0.9 % IV SOLN
INTRAVENOUS | Status: DC
  Administered 2016-05-04: 12:00:00 via INTRAVENOUS

## 2016-05-04 NOTE — Progress Notes (Addendum)
Pt states Dr. Charlestine Night is retiring April 26 and her new doctor will be Dr. Amil Amen.  Pt's orders will be fine for April under Dr. Charlestine Night.  Informed pt she will have to see the new doctor and have him send orders for her May appointment.  Pt states she has an appointment with Dr. Amil Amen in April and she is aware the orders will need to be from [facility] for May.  Also pt voiced concern over the fact she has been here since 1130 and was here for so long today.  Appointment was for 1130, pt arrived at that time,  actemra orders released to pharmacy at 1148, IV team started pt's IV at 1200.  Called pharmacy around 1230 to check to see if it was ready for pickup and it wasn't.  Pharmacy called at 1250: med was ready.   Actemra was hung/started at 1301, it is an hour long infusion.   Med complete at 1407 and IV site was d/c.   After pt used restroom, Pt was taken downstairs via wheelchair to lobby with friend at 1420.

## 2016-05-12 DIAGNOSIS — S86812D Strain of other muscle(s) and tendon(s) at lower leg level, left leg, subsequent encounter: Secondary | ICD-10-CM | POA: Diagnosis not present

## 2016-05-12 DIAGNOSIS — Z4889 Encounter for other specified surgical aftercare: Secondary | ICD-10-CM | POA: Diagnosis not present

## 2016-05-18 DIAGNOSIS — R030 Elevated blood-pressure reading, without diagnosis of hypertension: Secondary | ICD-10-CM | POA: Diagnosis not present

## 2016-05-18 DIAGNOSIS — Z6837 Body mass index (BMI) 37.0-37.9, adult: Secondary | ICD-10-CM | POA: Diagnosis not present

## 2016-05-18 DIAGNOSIS — M810 Age-related osteoporosis without current pathological fracture: Secondary | ICD-10-CM | POA: Diagnosis not present

## 2016-05-18 DIAGNOSIS — E784 Other hyperlipidemia: Secondary | ICD-10-CM | POA: Diagnosis not present

## 2016-05-18 DIAGNOSIS — M069 Rheumatoid arthritis, unspecified: Secondary | ICD-10-CM | POA: Diagnosis not present

## 2016-05-18 DIAGNOSIS — R7301 Impaired fasting glucose: Secondary | ICD-10-CM | POA: Diagnosis not present

## 2016-05-25 DIAGNOSIS — M25562 Pain in left knee: Secondary | ICD-10-CM | POA: Diagnosis not present

## 2016-05-25 DIAGNOSIS — M7532 Calcific tendinitis of left shoulder: Secondary | ICD-10-CM | POA: Diagnosis not present

## 2016-05-25 DIAGNOSIS — M0570 Rheumatoid arthritis with rheumatoid factor of unspecified site without organ or systems involvement: Secondary | ICD-10-CM | POA: Diagnosis not present

## 2016-05-25 DIAGNOSIS — Z79899 Other long term (current) drug therapy: Secondary | ICD-10-CM | POA: Diagnosis not present

## 2016-05-26 DIAGNOSIS — M0579 Rheumatoid arthritis with rheumatoid factor of multiple sites without organ or systems involvement: Secondary | ICD-10-CM | POA: Diagnosis not present

## 2016-05-26 DIAGNOSIS — M810 Age-related osteoporosis without current pathological fracture: Secondary | ICD-10-CM | POA: Diagnosis not present

## 2016-05-26 DIAGNOSIS — Z6837 Body mass index (BMI) 37.0-37.9, adult: Secondary | ICD-10-CM | POA: Diagnosis not present

## 2016-05-26 DIAGNOSIS — Z79899 Other long term (current) drug therapy: Secondary | ICD-10-CM | POA: Diagnosis not present

## 2016-05-26 DIAGNOSIS — E669 Obesity, unspecified: Secondary | ICD-10-CM | POA: Diagnosis not present

## 2016-06-01 ENCOUNTER — Encounter (HOSPITAL_COMMUNITY): Payer: Self-pay

## 2016-06-01 ENCOUNTER — Encounter (HOSPITAL_COMMUNITY)
Admission: RE | Admit: 2016-06-01 | Discharge: 2016-06-01 | Disposition: A | Discharge status: Home / Self Care | Payer: Medicare Other | Source: Ambulatory Visit | Attending: Rheumatology | Admitting: Rheumatology

## 2016-06-01 DIAGNOSIS — M069 Rheumatoid arthritis, unspecified: Secondary | ICD-10-CM | POA: Insufficient documentation

## 2016-06-01 MED ORDER — ACETAMINOPHEN 500 MG PO TABS
1000.0000 mg | ORAL_TABLET | Freq: Once | ORAL | Status: AC | PRN
  Administered 2016-06-01: 500 mg via ORAL
  Filled 2016-06-01: qty 2

## 2016-06-01 MED ORDER — TOCILIZUMAB 400 MG/20ML IV SOLN
400.0000 mg | Freq: Once | INTRAVENOUS | Status: AC
  Administered 2016-06-01: 400 mg via INTRAVENOUS
  Filled 2016-06-01: qty 20

## 2016-06-01 MED ORDER — DIPHENHYDRAMINE HCL 50 MG/ML IJ SOLN
50.0000 mg | Freq: Once | INTRAMUSCULAR | Status: AC
  Administered 2016-06-01: 37.5 mg via INTRAVENOUS
  Filled 2016-06-01: qty 1

## 2016-06-01 MED ORDER — SODIUM CHLORIDE 0.9 % IV SOLN
Freq: Once | INTRAVENOUS | Status: AC
  Administered 2016-06-01: 11:00:00 via INTRAVENOUS

## 2016-06-29 ENCOUNTER — Encounter (HOSPITAL_COMMUNITY): Admission: RE | Admit: 2016-06-29 | Payer: Medicare Other | Source: Ambulatory Visit

## 2016-06-29 DIAGNOSIS — M0579 Rheumatoid arthritis with rheumatoid factor of multiple sites without organ or systems involvement: Secondary | ICD-10-CM | POA: Diagnosis not present

## 2016-07-27 DIAGNOSIS — M0579 Rheumatoid arthritis with rheumatoid factor of multiple sites without organ or systems involvement: Secondary | ICD-10-CM | POA: Diagnosis not present

## 2016-08-18 DIAGNOSIS — M25561 Pain in right knee: Secondary | ICD-10-CM | POA: Diagnosis not present

## 2016-08-18 DIAGNOSIS — S86812D Strain of other muscle(s) and tendon(s) at lower leg level, left leg, subsequent encounter: Secondary | ICD-10-CM | POA: Diagnosis not present

## 2016-08-25 DIAGNOSIS — M0579 Rheumatoid arthritis with rheumatoid factor of multiple sites without organ or systems involvement: Secondary | ICD-10-CM | POA: Diagnosis not present

## 2016-08-25 DIAGNOSIS — M810 Age-related osteoporosis without current pathological fracture: Secondary | ICD-10-CM | POA: Diagnosis not present

## 2016-08-25 DIAGNOSIS — Z79899 Other long term (current) drug therapy: Secondary | ICD-10-CM | POA: Diagnosis not present

## 2016-08-25 DIAGNOSIS — E669 Obesity, unspecified: Secondary | ICD-10-CM | POA: Diagnosis not present

## 2016-08-25 DIAGNOSIS — Z6837 Body mass index (BMI) 37.0-37.9, adult: Secondary | ICD-10-CM | POA: Diagnosis not present

## 2016-08-31 DIAGNOSIS — Z79899 Other long term (current) drug therapy: Secondary | ICD-10-CM | POA: Diagnosis not present

## 2016-08-31 DIAGNOSIS — M0579 Rheumatoid arthritis with rheumatoid factor of multiple sites without organ or systems involvement: Secondary | ICD-10-CM | POA: Diagnosis not present

## 2016-09-28 DIAGNOSIS — M0579 Rheumatoid arthritis with rheumatoid factor of multiple sites without organ or systems involvement: Secondary | ICD-10-CM | POA: Diagnosis not present

## 2016-10-12 ENCOUNTER — Other Ambulatory Visit: Payer: Self-pay | Admitting: Internal Medicine

## 2016-10-12 DIAGNOSIS — Z1231 Encounter for screening mammogram for malignant neoplasm of breast: Secondary | ICD-10-CM

## 2016-10-26 DIAGNOSIS — Z79899 Other long term (current) drug therapy: Secondary | ICD-10-CM | POA: Diagnosis not present

## 2016-10-26 DIAGNOSIS — M0579 Rheumatoid arthritis with rheumatoid factor of multiple sites without organ or systems involvement: Secondary | ICD-10-CM | POA: Diagnosis not present

## 2016-11-02 DIAGNOSIS — Z85828 Personal history of other malignant neoplasm of skin: Secondary | ICD-10-CM | POA: Diagnosis not present

## 2016-11-02 DIAGNOSIS — Z23 Encounter for immunization: Secondary | ICD-10-CM | POA: Diagnosis not present

## 2016-11-02 DIAGNOSIS — L821 Other seborrheic keratosis: Secondary | ICD-10-CM | POA: Diagnosis not present

## 2016-11-11 DIAGNOSIS — S86812D Strain of other muscle(s) and tendon(s) at lower leg level, left leg, subsequent encounter: Secondary | ICD-10-CM | POA: Diagnosis not present

## 2016-11-14 DIAGNOSIS — M810 Age-related osteoporosis without current pathological fracture: Secondary | ICD-10-CM | POA: Diagnosis not present

## 2016-11-14 DIAGNOSIS — H04123 Dry eye syndrome of bilateral lacrimal glands: Secondary | ICD-10-CM | POA: Diagnosis not present

## 2016-11-14 DIAGNOSIS — E7849 Other hyperlipidemia: Secondary | ICD-10-CM | POA: Diagnosis not present

## 2016-11-14 DIAGNOSIS — Z0000 Encounter for general adult medical examination without abnormal findings: Secondary | ICD-10-CM | POA: Diagnosis not present

## 2016-11-14 DIAGNOSIS — R7301 Impaired fasting glucose: Secondary | ICD-10-CM | POA: Diagnosis not present

## 2016-11-21 DIAGNOSIS — Z1231 Encounter for screening mammogram for malignant neoplasm of breast: Secondary | ICD-10-CM | POA: Diagnosis not present

## 2016-11-21 DIAGNOSIS — Z0000 Encounter for general adult medical examination without abnormal findings: Secondary | ICD-10-CM | POA: Diagnosis not present

## 2016-11-21 DIAGNOSIS — Z1389 Encounter for screening for other disorder: Secondary | ICD-10-CM | POA: Diagnosis not present

## 2016-11-21 DIAGNOSIS — E668 Other obesity: Secondary | ICD-10-CM | POA: Diagnosis not present

## 2016-11-21 DIAGNOSIS — Z6837 Body mass index (BMI) 37.0-37.9, adult: Secondary | ICD-10-CM | POA: Diagnosis not present

## 2016-11-21 DIAGNOSIS — J3089 Other allergic rhinitis: Secondary | ICD-10-CM | POA: Diagnosis not present

## 2016-11-21 DIAGNOSIS — Z23 Encounter for immunization: Secondary | ICD-10-CM | POA: Diagnosis not present

## 2016-11-21 DIAGNOSIS — B029 Zoster without complications: Secondary | ICD-10-CM | POA: Diagnosis not present

## 2016-11-21 DIAGNOSIS — R7301 Impaired fasting glucose: Secondary | ICD-10-CM | POA: Diagnosis not present

## 2016-11-21 DIAGNOSIS — E7849 Other hyperlipidemia: Secondary | ICD-10-CM | POA: Diagnosis not present

## 2016-11-21 DIAGNOSIS — M810 Age-related osteoporosis without current pathological fracture: Secondary | ICD-10-CM | POA: Diagnosis not present

## 2016-11-21 DIAGNOSIS — M25562 Pain in left knee: Secondary | ICD-10-CM | POA: Diagnosis not present

## 2016-11-21 DIAGNOSIS — R011 Cardiac murmur, unspecified: Secondary | ICD-10-CM | POA: Diagnosis not present

## 2016-11-23 DIAGNOSIS — Z79899 Other long term (current) drug therapy: Secondary | ICD-10-CM | POA: Diagnosis not present

## 2016-11-23 DIAGNOSIS — M0579 Rheumatoid arthritis with rheumatoid factor of multiple sites without organ or systems involvement: Secondary | ICD-10-CM | POA: Diagnosis not present

## 2016-12-07 ENCOUNTER — Ambulatory Visit
Admission: RE | Admit: 2016-12-07 | Discharge: 2016-12-07 | Disposition: A | Discharge status: Home / Self Care | Payer: Medicare Other | Source: Ambulatory Visit | Attending: Internal Medicine | Admitting: Internal Medicine

## 2016-12-07 DIAGNOSIS — Z1231 Encounter for screening mammogram for malignant neoplasm of breast: Secondary | ICD-10-CM | POA: Diagnosis not present

## 2016-12-08 ENCOUNTER — Other Ambulatory Visit: Payer: Self-pay | Admitting: Internal Medicine

## 2016-12-08 DIAGNOSIS — R928 Other abnormal and inconclusive findings on diagnostic imaging of breast: Secondary | ICD-10-CM

## 2016-12-12 DIAGNOSIS — H2513 Age-related nuclear cataract, bilateral: Secondary | ICD-10-CM | POA: Diagnosis not present

## 2016-12-13 DIAGNOSIS — Z1212 Encounter for screening for malignant neoplasm of rectum: Secondary | ICD-10-CM | POA: Diagnosis not present

## 2016-12-13 DIAGNOSIS — Z1211 Encounter for screening for malignant neoplasm of colon: Secondary | ICD-10-CM | POA: Diagnosis not present

## 2016-12-14 ENCOUNTER — Other Ambulatory Visit: Payer: Self-pay | Admitting: Internal Medicine

## 2016-12-14 ENCOUNTER — Ambulatory Visit
Admission: RE | Admit: 2016-12-14 | Discharge: 2016-12-14 | Disposition: A | Discharge status: Home / Self Care | Payer: Medicare Other | Source: Ambulatory Visit | Attending: Internal Medicine | Admitting: Internal Medicine

## 2016-12-14 DIAGNOSIS — R928 Other abnormal and inconclusive findings on diagnostic imaging of breast: Secondary | ICD-10-CM | POA: Diagnosis not present

## 2016-12-14 DIAGNOSIS — N6489 Other specified disorders of breast: Secondary | ICD-10-CM | POA: Diagnosis not present

## 2016-12-21 DIAGNOSIS — M0579 Rheumatoid arthritis with rheumatoid factor of multiple sites without organ or systems involvement: Secondary | ICD-10-CM | POA: Diagnosis not present

## 2016-12-23 ENCOUNTER — Ambulatory Visit
Admission: RE | Admit: 2016-12-23 | Discharge: 2016-12-23 | Disposition: A | Discharge status: Home / Self Care | Payer: Medicare Other | Source: Ambulatory Visit | Attending: Internal Medicine | Admitting: Internal Medicine

## 2016-12-23 ENCOUNTER — Other Ambulatory Visit: Payer: Self-pay | Admitting: Internal Medicine

## 2016-12-23 DIAGNOSIS — N6310 Unspecified lump in the right breast, unspecified quadrant: Secondary | ICD-10-CM

## 2016-12-23 DIAGNOSIS — R928 Other abnormal and inconclusive findings on diagnostic imaging of breast: Secondary | ICD-10-CM

## 2016-12-23 DIAGNOSIS — N6313 Unspecified lump in the right breast, lower outer quadrant: Secondary | ICD-10-CM | POA: Diagnosis not present

## 2016-12-23 DIAGNOSIS — N6011 Diffuse cystic mastopathy of right breast: Secondary | ICD-10-CM | POA: Diagnosis not present

## 2016-12-26 DIAGNOSIS — E669 Obesity, unspecified: Secondary | ICD-10-CM | POA: Diagnosis not present

## 2016-12-26 DIAGNOSIS — Z6837 Body mass index (BMI) 37.0-37.9, adult: Secondary | ICD-10-CM | POA: Diagnosis not present

## 2016-12-26 DIAGNOSIS — M0579 Rheumatoid arthritis with rheumatoid factor of multiple sites without organ or systems involvement: Secondary | ICD-10-CM | POA: Diagnosis not present

## 2016-12-26 DIAGNOSIS — M810 Age-related osteoporosis without current pathological fracture: Secondary | ICD-10-CM | POA: Diagnosis not present

## 2016-12-26 DIAGNOSIS — Z79899 Other long term (current) drug therapy: Secondary | ICD-10-CM | POA: Diagnosis not present

## 2017-01-10 DIAGNOSIS — Z6837 Body mass index (BMI) 37.0-37.9, adult: Secondary | ICD-10-CM | POA: Diagnosis not present

## 2017-01-10 DIAGNOSIS — M069 Rheumatoid arthritis, unspecified: Secondary | ICD-10-CM | POA: Diagnosis not present

## 2017-01-10 DIAGNOSIS — R042 Hemoptysis: Secondary | ICD-10-CM | POA: Diagnosis not present

## 2017-01-10 DIAGNOSIS — R05 Cough: Secondary | ICD-10-CM | POA: Diagnosis not present

## 2017-01-10 DIAGNOSIS — J189 Pneumonia, unspecified organism: Secondary | ICD-10-CM | POA: Diagnosis not present

## 2017-01-10 DIAGNOSIS — R030 Elevated blood-pressure reading, without diagnosis of hypertension: Secondary | ICD-10-CM | POA: Diagnosis not present

## 2017-01-18 DIAGNOSIS — Z6836 Body mass index (BMI) 36.0-36.9, adult: Secondary | ICD-10-CM | POA: Diagnosis not present

## 2017-01-18 DIAGNOSIS — J189 Pneumonia, unspecified organism: Secondary | ICD-10-CM | POA: Diagnosis not present

## 2017-01-23 DIAGNOSIS — R195 Other fecal abnormalities: Secondary | ICD-10-CM | POA: Diagnosis not present

## 2017-02-01 DIAGNOSIS — J189 Pneumonia, unspecified organism: Secondary | ICD-10-CM | POA: Diagnosis not present

## 2017-02-16 DIAGNOSIS — K5730 Diverticulosis of large intestine without perforation or abscess without bleeding: Secondary | ICD-10-CM | POA: Diagnosis not present

## 2017-02-16 DIAGNOSIS — K648 Other hemorrhoids: Secondary | ICD-10-CM | POA: Diagnosis not present

## 2017-02-16 DIAGNOSIS — Z1211 Encounter for screening for malignant neoplasm of colon: Secondary | ICD-10-CM | POA: Diagnosis not present

## 2017-02-16 DIAGNOSIS — D128 Benign neoplasm of rectum: Secondary | ICD-10-CM | POA: Diagnosis not present

## 2017-02-16 DIAGNOSIS — R195 Other fecal abnormalities: Secondary | ICD-10-CM | POA: Diagnosis not present

## 2017-02-22 DIAGNOSIS — M0579 Rheumatoid arthritis with rheumatoid factor of multiple sites without organ or systems involvement: Secondary | ICD-10-CM | POA: Diagnosis not present

## 2017-03-15 DIAGNOSIS — M069 Rheumatoid arthritis, unspecified: Secondary | ICD-10-CM | POA: Diagnosis not present

## 2017-03-15 DIAGNOSIS — M62838 Other muscle spasm: Secondary | ICD-10-CM | POA: Diagnosis not present

## 2017-03-15 DIAGNOSIS — Z6836 Body mass index (BMI) 36.0-36.9, adult: Secondary | ICD-10-CM | POA: Diagnosis not present

## 2017-03-15 DIAGNOSIS — J189 Pneumonia, unspecified organism: Secondary | ICD-10-CM | POA: Diagnosis not present

## 2017-03-22 DIAGNOSIS — M0579 Rheumatoid arthritis with rheumatoid factor of multiple sites without organ or systems involvement: Secondary | ICD-10-CM | POA: Diagnosis not present

## 2017-04-19 DIAGNOSIS — M0579 Rheumatoid arthritis with rheumatoid factor of multiple sites without organ or systems involvement: Secondary | ICD-10-CM | POA: Diagnosis not present

## 2017-05-04 DIAGNOSIS — M810 Age-related osteoporosis without current pathological fracture: Secondary | ICD-10-CM | POA: Diagnosis not present

## 2017-05-04 DIAGNOSIS — E669 Obesity, unspecified: Secondary | ICD-10-CM | POA: Diagnosis not present

## 2017-05-04 DIAGNOSIS — M0579 Rheumatoid arthritis with rheumatoid factor of multiple sites without organ or systems involvement: Secondary | ICD-10-CM | POA: Diagnosis not present

## 2017-05-04 DIAGNOSIS — Z6837 Body mass index (BMI) 37.0-37.9, adult: Secondary | ICD-10-CM | POA: Diagnosis not present

## 2017-05-04 DIAGNOSIS — Z79899 Other long term (current) drug therapy: Secondary | ICD-10-CM | POA: Diagnosis not present

## 2017-05-16 ENCOUNTER — Other Ambulatory Visit: Payer: Self-pay | Admitting: Internal Medicine

## 2017-05-16 DIAGNOSIS — N6011 Diffuse cystic mastopathy of right breast: Secondary | ICD-10-CM

## 2017-05-17 DIAGNOSIS — M0579 Rheumatoid arthritis with rheumatoid factor of multiple sites without organ or systems involvement: Secondary | ICD-10-CM | POA: Diagnosis not present

## 2017-05-19 DIAGNOSIS — M1712 Unilateral primary osteoarthritis, left knee: Secondary | ICD-10-CM | POA: Diagnosis not present

## 2017-05-19 DIAGNOSIS — M6688 Spontaneous rupture of other tendons, other: Secondary | ICD-10-CM | POA: Diagnosis not present

## 2017-05-24 DIAGNOSIS — R7301 Impaired fasting glucose: Secondary | ICD-10-CM | POA: Diagnosis not present

## 2017-05-24 DIAGNOSIS — M810 Age-related osteoporosis without current pathological fracture: Secondary | ICD-10-CM | POA: Diagnosis not present

## 2017-05-24 DIAGNOSIS — R011 Cardiac murmur, unspecified: Secondary | ICD-10-CM | POA: Diagnosis not present

## 2017-05-24 DIAGNOSIS — D126 Benign neoplasm of colon, unspecified: Secondary | ICD-10-CM | POA: Diagnosis not present

## 2017-05-24 DIAGNOSIS — Z6837 Body mass index (BMI) 37.0-37.9, adult: Secondary | ICD-10-CM | POA: Diagnosis not present

## 2017-05-24 DIAGNOSIS — M069 Rheumatoid arthritis, unspecified: Secondary | ICD-10-CM | POA: Diagnosis not present

## 2017-05-24 DIAGNOSIS — M62838 Other muscle spasm: Secondary | ICD-10-CM | POA: Diagnosis not present

## 2017-05-31 ENCOUNTER — Other Ambulatory Visit: Payer: Self-pay | Admitting: Internal Medicine

## 2017-05-31 ENCOUNTER — Ambulatory Visit
Admission: RE | Admit: 2017-05-31 | Discharge: 2017-05-31 | Disposition: A | Discharge status: Home / Self Care | Payer: Medicare Other | Source: Ambulatory Visit | Attending: Internal Medicine | Admitting: Internal Medicine

## 2017-05-31 DIAGNOSIS — N6310 Unspecified lump in the right breast, unspecified quadrant: Secondary | ICD-10-CM

## 2017-05-31 DIAGNOSIS — N6011 Diffuse cystic mastopathy of right breast: Secondary | ICD-10-CM

## 2017-05-31 DIAGNOSIS — R928 Other abnormal and inconclusive findings on diagnostic imaging of breast: Secondary | ICD-10-CM | POA: Diagnosis not present

## 2017-06-05 DIAGNOSIS — H04123 Dry eye syndrome of bilateral lacrimal glands: Secondary | ICD-10-CM | POA: Diagnosis not present

## 2017-06-14 DIAGNOSIS — M0579 Rheumatoid arthritis with rheumatoid factor of multiple sites without organ or systems involvement: Secondary | ICD-10-CM | POA: Diagnosis not present

## 2017-06-26 DIAGNOSIS — H04123 Dry eye syndrome of bilateral lacrimal glands: Secondary | ICD-10-CM | POA: Diagnosis not present

## 2017-06-26 DIAGNOSIS — H2513 Age-related nuclear cataract, bilateral: Secondary | ICD-10-CM | POA: Diagnosis not present

## 2017-07-12 DIAGNOSIS — M0579 Rheumatoid arthritis with rheumatoid factor of multiple sites without organ or systems involvement: Secondary | ICD-10-CM | POA: Diagnosis not present

## 2017-07-12 DIAGNOSIS — Z79899 Other long term (current) drug therapy: Secondary | ICD-10-CM | POA: Diagnosis not present

## 2017-07-26 DIAGNOSIS — H2511 Age-related nuclear cataract, right eye: Secondary | ICD-10-CM | POA: Diagnosis not present

## 2017-07-26 DIAGNOSIS — H25811 Combined forms of age-related cataract, right eye: Secondary | ICD-10-CM | POA: Diagnosis not present

## 2017-08-09 DIAGNOSIS — M0579 Rheumatoid arthritis with rheumatoid factor of multiple sites without organ or systems involvement: Secondary | ICD-10-CM | POA: Diagnosis not present

## 2017-08-09 DIAGNOSIS — Z79899 Other long term (current) drug therapy: Secondary | ICD-10-CM | POA: Diagnosis not present

## 2017-08-31 DIAGNOSIS — Z6837 Body mass index (BMI) 37.0-37.9, adult: Secondary | ICD-10-CM | POA: Diagnosis not present

## 2017-08-31 DIAGNOSIS — M810 Age-related osteoporosis without current pathological fracture: Secondary | ICD-10-CM | POA: Diagnosis not present

## 2017-08-31 DIAGNOSIS — E669 Obesity, unspecified: Secondary | ICD-10-CM | POA: Diagnosis not present

## 2017-08-31 DIAGNOSIS — M0579 Rheumatoid arthritis with rheumatoid factor of multiple sites without organ or systems involvement: Secondary | ICD-10-CM | POA: Diagnosis not present

## 2017-08-31 DIAGNOSIS — Z79899 Other long term (current) drug therapy: Secondary | ICD-10-CM | POA: Diagnosis not present

## 2017-09-06 DIAGNOSIS — H25812 Combined forms of age-related cataract, left eye: Secondary | ICD-10-CM | POA: Diagnosis not present

## 2017-09-06 DIAGNOSIS — H2512 Age-related nuclear cataract, left eye: Secondary | ICD-10-CM | POA: Diagnosis not present

## 2017-09-20 DIAGNOSIS — M0579 Rheumatoid arthritis with rheumatoid factor of multiple sites without organ or systems involvement: Secondary | ICD-10-CM | POA: Diagnosis not present

## 2017-10-18 DIAGNOSIS — M0579 Rheumatoid arthritis with rheumatoid factor of multiple sites without organ or systems involvement: Secondary | ICD-10-CM | POA: Diagnosis not present

## 2017-11-22 DIAGNOSIS — M0579 Rheumatoid arthritis with rheumatoid factor of multiple sites without organ or systems involvement: Secondary | ICD-10-CM | POA: Diagnosis not present

## 2017-11-22 DIAGNOSIS — Z79899 Other long term (current) drug therapy: Secondary | ICD-10-CM | POA: Diagnosis not present

## 2017-11-23 DIAGNOSIS — Z85828 Personal history of other malignant neoplasm of skin: Secondary | ICD-10-CM | POA: Diagnosis not present

## 2017-11-23 DIAGNOSIS — Z23 Encounter for immunization: Secondary | ICD-10-CM | POA: Diagnosis not present

## 2017-11-23 DIAGNOSIS — L821 Other seborrheic keratosis: Secondary | ICD-10-CM | POA: Diagnosis not present

## 2017-11-29 DIAGNOSIS — Z79899 Other long term (current) drug therapy: Secondary | ICD-10-CM | POA: Diagnosis not present

## 2017-11-29 DIAGNOSIS — Z6836 Body mass index (BMI) 36.0-36.9, adult: Secondary | ICD-10-CM | POA: Diagnosis not present

## 2017-11-29 DIAGNOSIS — M810 Age-related osteoporosis without current pathological fracture: Secondary | ICD-10-CM | POA: Diagnosis not present

## 2017-11-29 DIAGNOSIS — M0579 Rheumatoid arthritis with rheumatoid factor of multiple sites without organ or systems involvement: Secondary | ICD-10-CM | POA: Diagnosis not present

## 2017-11-29 DIAGNOSIS — E669 Obesity, unspecified: Secondary | ICD-10-CM | POA: Diagnosis not present

## 2017-12-13 ENCOUNTER — Ambulatory Visit
Admission: RE | Admit: 2017-12-13 | Discharge: 2017-12-13 | Disposition: A | Discharge status: Home / Self Care | Payer: Medicare Other | Source: Ambulatory Visit | Attending: Internal Medicine | Admitting: Internal Medicine

## 2017-12-13 DIAGNOSIS — N6310 Unspecified lump in the right breast, unspecified quadrant: Secondary | ICD-10-CM

## 2017-12-13 DIAGNOSIS — N6313 Unspecified lump in the right breast, lower outer quadrant: Secondary | ICD-10-CM | POA: Diagnosis not present

## 2017-12-13 DIAGNOSIS — R928 Other abnormal and inconclusive findings on diagnostic imaging of breast: Secondary | ICD-10-CM | POA: Diagnosis not present

## 2017-12-20 DIAGNOSIS — M0579 Rheumatoid arthritis with rheumatoid factor of multiple sites without organ or systems involvement: Secondary | ICD-10-CM | POA: Diagnosis not present

## 2017-12-27 DIAGNOSIS — R7301 Impaired fasting glucose: Secondary | ICD-10-CM | POA: Diagnosis not present

## 2017-12-27 DIAGNOSIS — E7849 Other hyperlipidemia: Secondary | ICD-10-CM | POA: Diagnosis not present

## 2017-12-27 DIAGNOSIS — M810 Age-related osteoporosis without current pathological fracture: Secondary | ICD-10-CM | POA: Diagnosis not present

## 2017-12-28 DIAGNOSIS — Z471 Aftercare following joint replacement surgery: Secondary | ICD-10-CM | POA: Diagnosis not present

## 2017-12-28 DIAGNOSIS — R82998 Other abnormal findings in urine: Secondary | ICD-10-CM | POA: Diagnosis not present

## 2017-12-28 DIAGNOSIS — Z96652 Presence of left artificial knee joint: Secondary | ICD-10-CM | POA: Diagnosis not present

## 2018-01-03 ENCOUNTER — Other Ambulatory Visit (HOSPITAL_COMMUNITY): Payer: Self-pay | Admitting: Internal Medicine

## 2018-01-03 DIAGNOSIS — J3089 Other allergic rhinitis: Secondary | ICD-10-CM | POA: Diagnosis not present

## 2018-01-03 DIAGNOSIS — R011 Cardiac murmur, unspecified: Secondary | ICD-10-CM

## 2018-01-03 DIAGNOSIS — M069 Rheumatoid arthritis, unspecified: Secondary | ICD-10-CM | POA: Diagnosis not present

## 2018-01-03 DIAGNOSIS — Z6836 Body mass index (BMI) 36.0-36.9, adult: Secondary | ICD-10-CM | POA: Diagnosis not present

## 2018-01-03 DIAGNOSIS — Z0000 Encounter for general adult medical examination without abnormal findings: Secondary | ICD-10-CM | POA: Diagnosis not present

## 2018-01-03 DIAGNOSIS — Z1389 Encounter for screening for other disorder: Secondary | ICD-10-CM | POA: Diagnosis not present

## 2018-01-03 DIAGNOSIS — R7301 Impaired fasting glucose: Secondary | ICD-10-CM | POA: Diagnosis not present

## 2018-01-03 DIAGNOSIS — B0089 Other herpesviral infection: Secondary | ICD-10-CM | POA: Diagnosis not present

## 2018-01-03 DIAGNOSIS — D126 Benign neoplasm of colon, unspecified: Secondary | ICD-10-CM | POA: Diagnosis not present

## 2018-01-03 DIAGNOSIS — M62838 Other muscle spasm: Secondary | ICD-10-CM | POA: Diagnosis not present

## 2018-01-03 DIAGNOSIS — M810 Age-related osteoporosis without current pathological fracture: Secondary | ICD-10-CM | POA: Diagnosis not present

## 2018-01-03 DIAGNOSIS — M25562 Pain in left knee: Secondary | ICD-10-CM | POA: Diagnosis not present

## 2018-01-08 ENCOUNTER — Other Ambulatory Visit: Payer: Self-pay | Admitting: Internal Medicine

## 2018-01-08 DIAGNOSIS — E785 Hyperlipidemia, unspecified: Secondary | ICD-10-CM

## 2018-01-17 ENCOUNTER — Encounter (INDEPENDENT_AMBULATORY_CARE_PROVIDER_SITE_OTHER): Payer: Self-pay

## 2018-01-17 ENCOUNTER — Ambulatory Visit (HOSPITAL_COMMUNITY): Payer: Medicare Other | Attending: Cardiology

## 2018-01-17 ENCOUNTER — Other Ambulatory Visit: Payer: Self-pay

## 2018-01-17 DIAGNOSIS — R011 Cardiac murmur, unspecified: Secondary | ICD-10-CM | POA: Insufficient documentation

## 2018-01-17 DIAGNOSIS — Z79899 Other long term (current) drug therapy: Secondary | ICD-10-CM | POA: Diagnosis not present

## 2018-01-17 DIAGNOSIS — M0579 Rheumatoid arthritis with rheumatoid factor of multiple sites without organ or systems involvement: Secondary | ICD-10-CM | POA: Diagnosis not present

## 2018-01-24 ENCOUNTER — Ambulatory Visit
Admission: RE | Admit: 2018-01-24 | Discharge: 2018-01-24 | Disposition: A | Discharge status: Home / Self Care | Payer: No Typology Code available for payment source | Source: Ambulatory Visit | Attending: Internal Medicine | Admitting: Internal Medicine

## 2018-01-24 DIAGNOSIS — E785 Hyperlipidemia, unspecified: Secondary | ICD-10-CM

## 2018-02-14 DIAGNOSIS — M0579 Rheumatoid arthritis with rheumatoid factor of multiple sites without organ or systems involvement: Secondary | ICD-10-CM | POA: Diagnosis not present

## 2018-02-14 DIAGNOSIS — Z79899 Other long term (current) drug therapy: Secondary | ICD-10-CM | POA: Diagnosis not present

## 2018-04-04 DIAGNOSIS — M810 Age-related osteoporosis without current pathological fracture: Secondary | ICD-10-CM | POA: Diagnosis not present

## 2018-04-04 DIAGNOSIS — E669 Obesity, unspecified: Secondary | ICD-10-CM | POA: Diagnosis not present

## 2018-04-04 DIAGNOSIS — M0579 Rheumatoid arthritis with rheumatoid factor of multiple sites without organ or systems involvement: Secondary | ICD-10-CM | POA: Diagnosis not present

## 2018-04-04 DIAGNOSIS — Z79899 Other long term (current) drug therapy: Secondary | ICD-10-CM | POA: Diagnosis not present

## 2018-04-04 DIAGNOSIS — R05 Cough: Secondary | ICD-10-CM | POA: Diagnosis not present

## 2018-04-04 DIAGNOSIS — Z6837 Body mass index (BMI) 37.0-37.9, adult: Secondary | ICD-10-CM | POA: Diagnosis not present

## 2018-04-11 DIAGNOSIS — M810 Age-related osteoporosis without current pathological fracture: Secondary | ICD-10-CM | POA: Diagnosis not present

## 2018-04-11 DIAGNOSIS — M0579 Rheumatoid arthritis with rheumatoid factor of multiple sites without organ or systems involvement: Secondary | ICD-10-CM | POA: Diagnosis not present

## 2018-06-28 DIAGNOSIS — H353132 Nonexudative age-related macular degeneration, bilateral, intermediate dry stage: Secondary | ICD-10-CM | POA: Diagnosis not present

## 2018-06-28 DIAGNOSIS — Z961 Presence of intraocular lens: Secondary | ICD-10-CM | POA: Diagnosis not present

## 2018-07-05 DIAGNOSIS — E785 Hyperlipidemia, unspecified: Secondary | ICD-10-CM | POA: Diagnosis not present

## 2018-07-05 DIAGNOSIS — I2510 Atherosclerotic heart disease of native coronary artery without angina pectoris: Secondary | ICD-10-CM | POA: Diagnosis not present

## 2018-07-05 DIAGNOSIS — M069 Rheumatoid arthritis, unspecified: Secondary | ICD-10-CM | POA: Diagnosis not present

## 2018-07-05 DIAGNOSIS — M810 Age-related osteoporosis without current pathological fracture: Secondary | ICD-10-CM | POA: Diagnosis not present

## 2018-07-05 DIAGNOSIS — I350 Nonrheumatic aortic (valve) stenosis: Secondary | ICD-10-CM | POA: Diagnosis not present

## 2018-07-05 DIAGNOSIS — R030 Elevated blood-pressure reading, without diagnosis of hypertension: Secondary | ICD-10-CM | POA: Diagnosis not present

## 2018-07-05 DIAGNOSIS — R7301 Impaired fasting glucose: Secondary | ICD-10-CM | POA: Diagnosis not present

## 2018-07-11 DIAGNOSIS — M0579 Rheumatoid arthritis with rheumatoid factor of multiple sites without organ or systems involvement: Secondary | ICD-10-CM | POA: Diagnosis not present

## 2018-08-01 DIAGNOSIS — Z79899 Other long term (current) drug therapy: Secondary | ICD-10-CM | POA: Diagnosis not present

## 2018-08-01 DIAGNOSIS — M0579 Rheumatoid arthritis with rheumatoid factor of multiple sites without organ or systems involvement: Secondary | ICD-10-CM | POA: Diagnosis not present

## 2018-08-01 DIAGNOSIS — Z6837 Body mass index (BMI) 37.0-37.9, adult: Secondary | ICD-10-CM | POA: Diagnosis not present

## 2018-08-01 DIAGNOSIS — M810 Age-related osteoporosis without current pathological fracture: Secondary | ICD-10-CM | POA: Diagnosis not present

## 2018-08-01 DIAGNOSIS — E669 Obesity, unspecified: Secondary | ICD-10-CM | POA: Diagnosis not present

## 2018-08-08 DIAGNOSIS — Z79899 Other long term (current) drug therapy: Secondary | ICD-10-CM | POA: Diagnosis not present

## 2018-08-08 DIAGNOSIS — M0579 Rheumatoid arthritis with rheumatoid factor of multiple sites without organ or systems involvement: Secondary | ICD-10-CM | POA: Diagnosis not present

## 2018-08-23 DIAGNOSIS — Z96652 Presence of left artificial knee joint: Secondary | ICD-10-CM | POA: Diagnosis not present

## 2018-09-05 DIAGNOSIS — M0579 Rheumatoid arthritis with rheumatoid factor of multiple sites without organ or systems involvement: Secondary | ICD-10-CM | POA: Diagnosis not present

## 2018-10-03 DIAGNOSIS — M0579 Rheumatoid arthritis with rheumatoid factor of multiple sites without organ or systems involvement: Secondary | ICD-10-CM | POA: Diagnosis not present

## 2018-10-31 DIAGNOSIS — M0579 Rheumatoid arthritis with rheumatoid factor of multiple sites without organ or systems involvement: Secondary | ICD-10-CM | POA: Diagnosis not present

## 2018-11-06 ENCOUNTER — Other Ambulatory Visit: Payer: Self-pay | Admitting: Internal Medicine

## 2018-11-06 DIAGNOSIS — Z1231 Encounter for screening mammogram for malignant neoplasm of breast: Secondary | ICD-10-CM

## 2018-11-08 DIAGNOSIS — Z23 Encounter for immunization: Secondary | ICD-10-CM | POA: Diagnosis not present

## 2018-11-28 DIAGNOSIS — M0579 Rheumatoid arthritis with rheumatoid factor of multiple sites without organ or systems involvement: Secondary | ICD-10-CM | POA: Diagnosis not present

## 2018-11-28 IMAGING — MG 2D DIGITAL SCREENING BILATERAL MAMMOGRAM WITH CAD AND ADJUNCT TO
8 of 12 series · 8 of 28 positions shown · non-contrast
Comparison: Previous exam(s).

CLINICAL DATA: Screening.

EXAM:
2D DIGITAL SCREENING BILATERAL MAMMOGRAM WITH CAD AND ADJUNCT TOMO

[R CC]
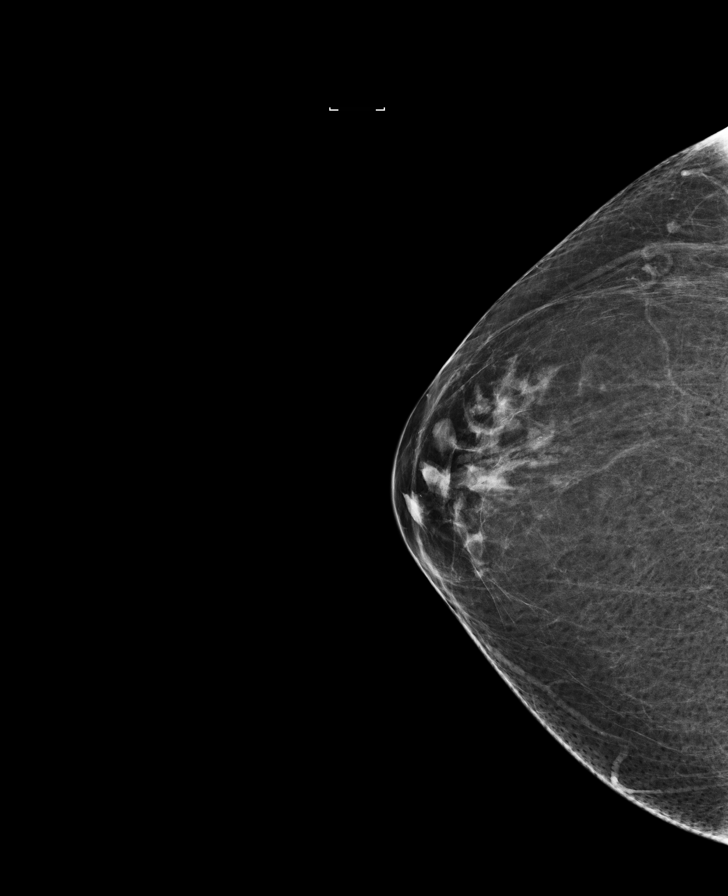

[R CC synth-2D]
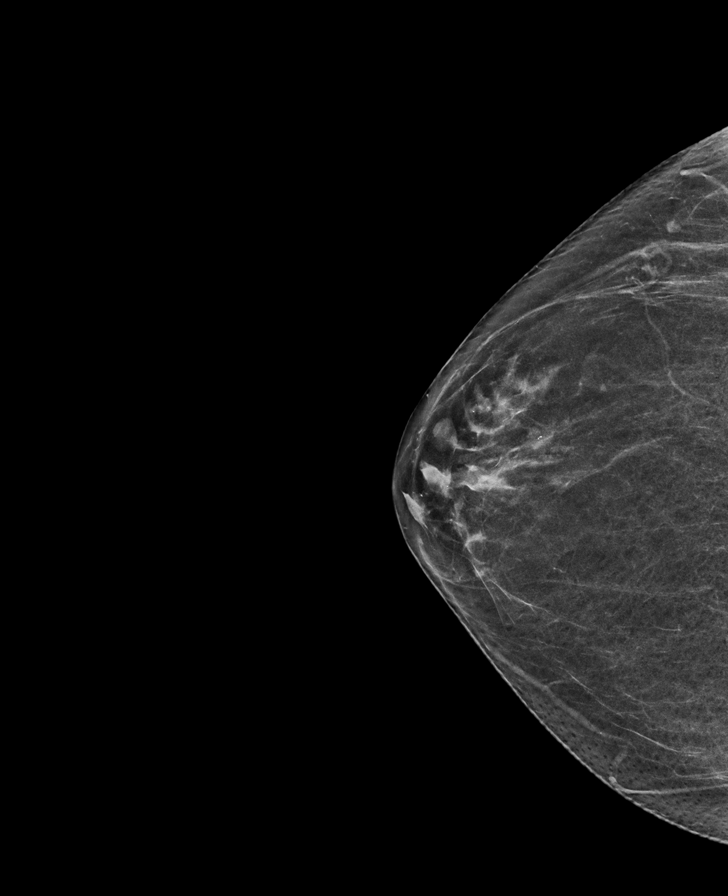

[L CC]
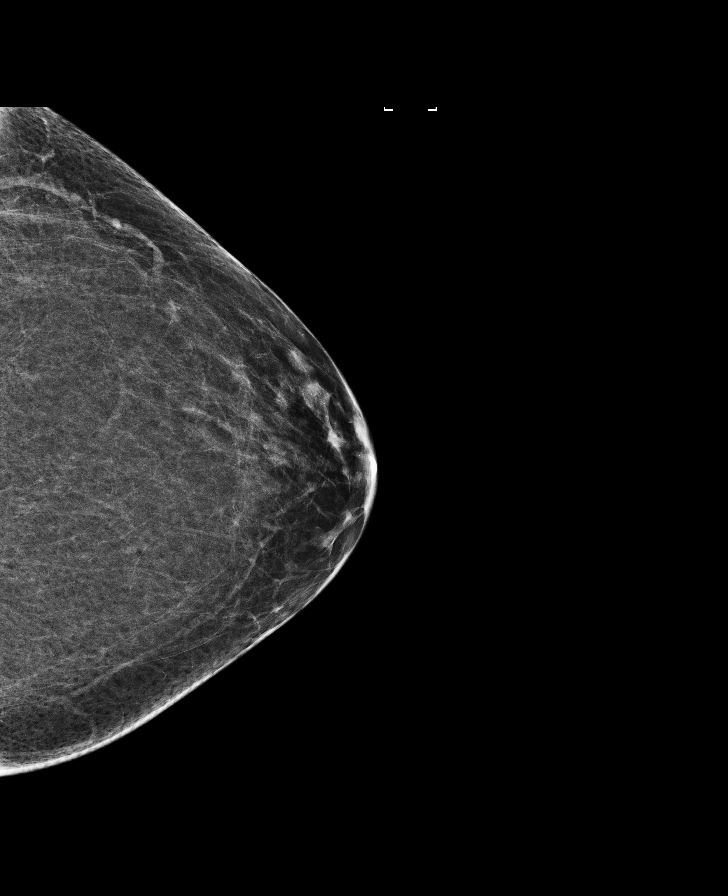

[L MLO]
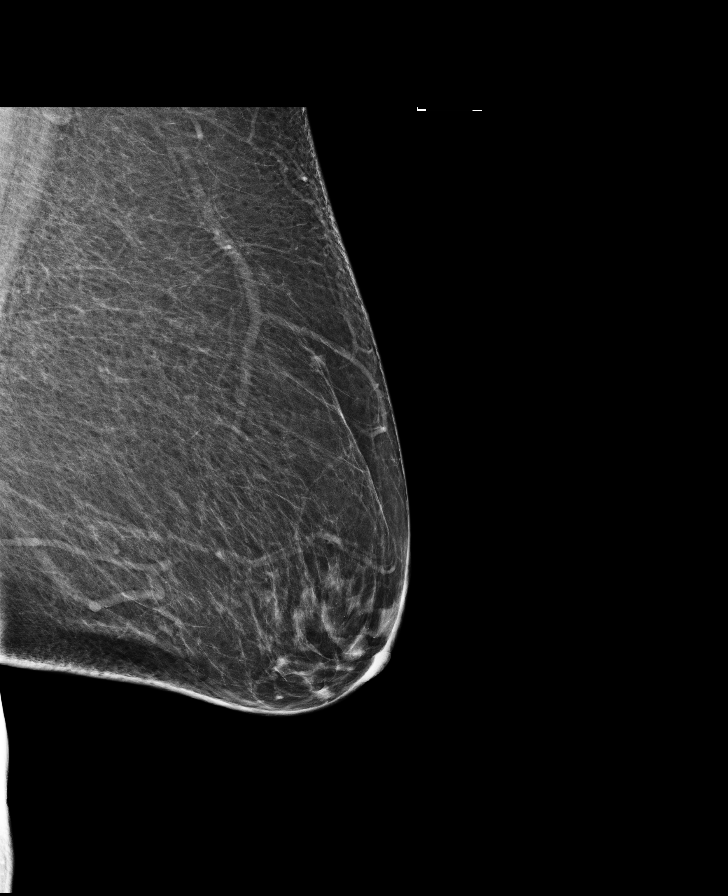

[L CC synth-2D]
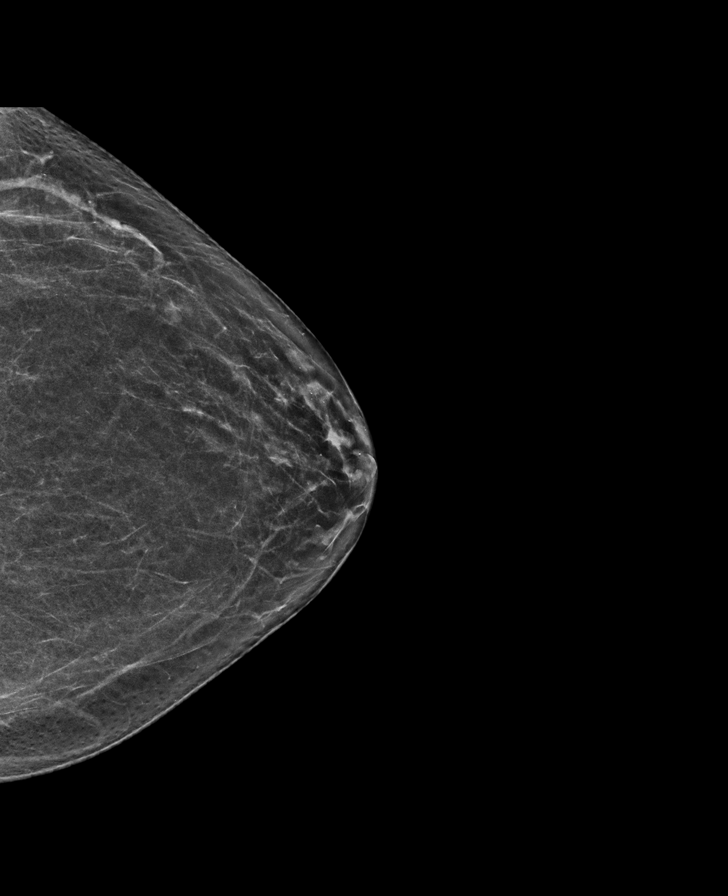

[R MLO]
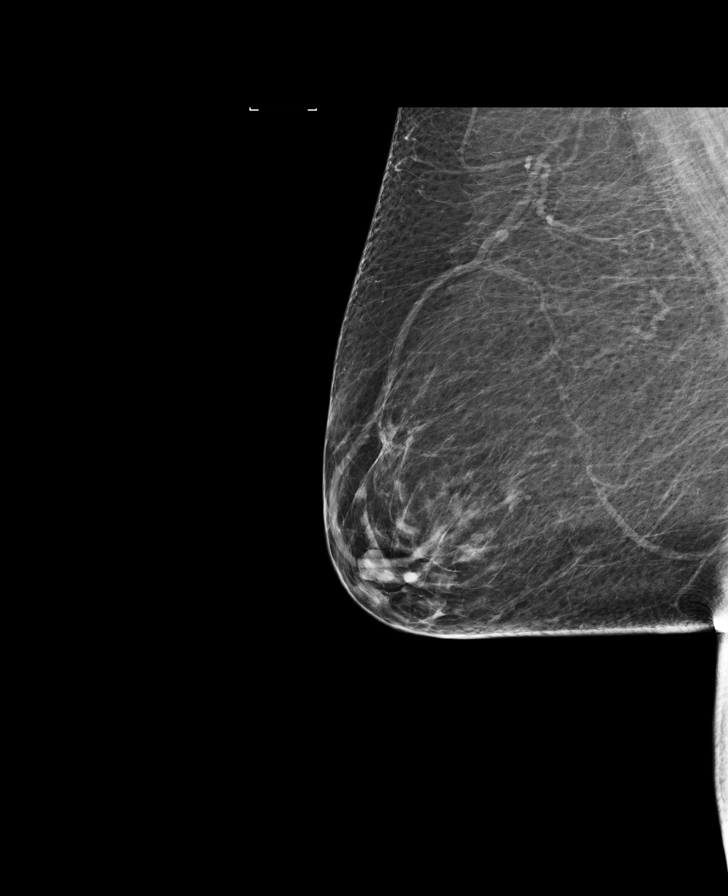

[R MLO synth-2D]
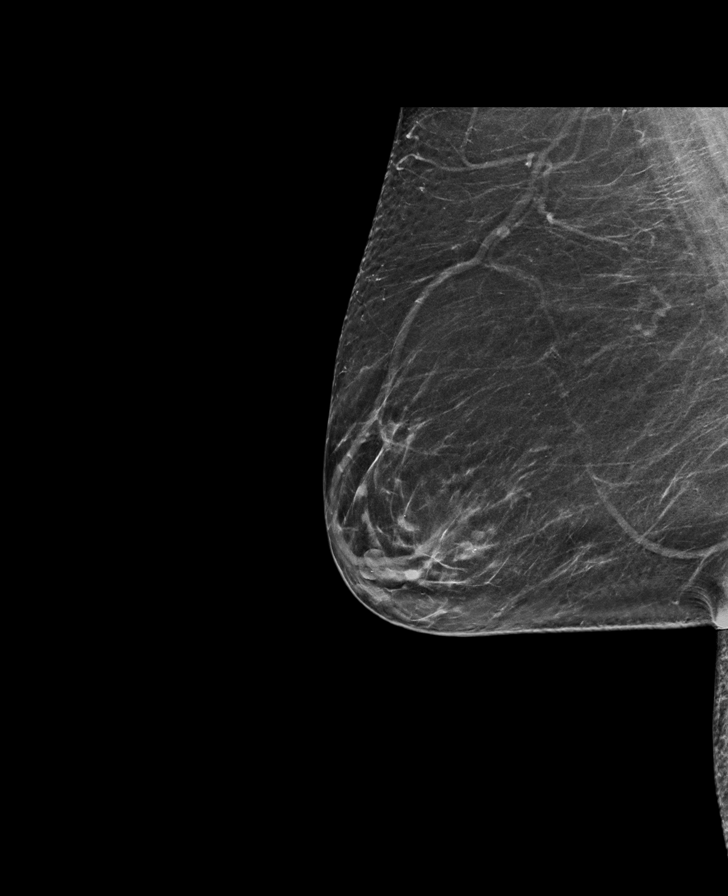

[L MLO synth-2D]
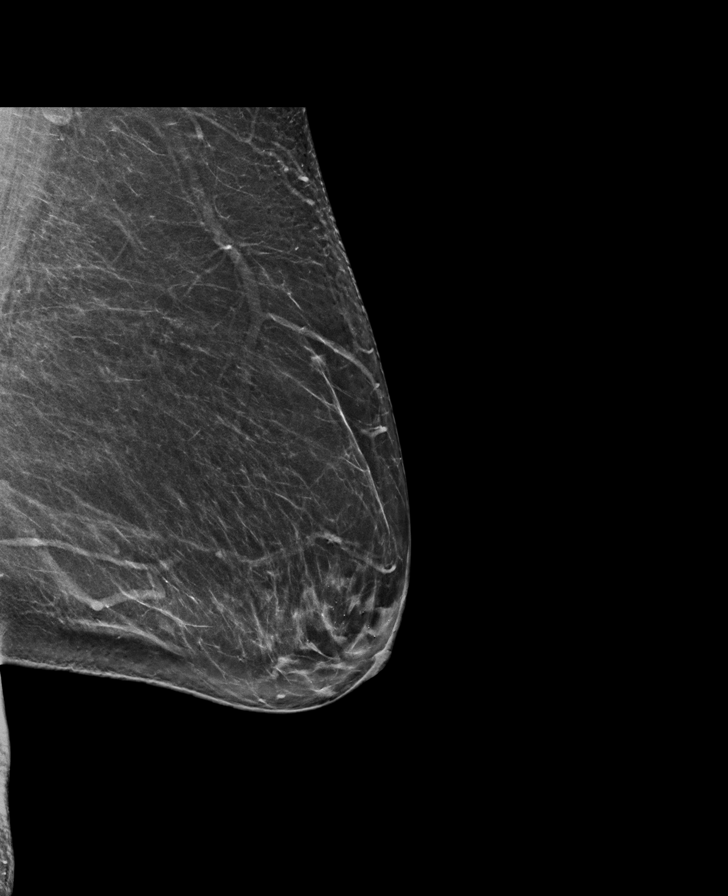

[8 of 28 positions shown; findings below may reference images not displayed]

ACR Breast Density Category b: There are scattered areas of
fibroglandular density.
FINDINGS: In the right breast, a possible mass warrants further evaluation. In
the left breast, no findings suspicious for malignancy. Images were
processed with CAD.
IMPRESSION: Further evaluation is suggested for possible mass in the right
breast.

RECOMMENDATION:
Diagnostic mammogram and possibly ultrasound of the right breast.
(Code:[id number])

The patient will be contacted regarding the findings, and additional
imaging will be scheduled.

BI-RADS CATEGORY  0: Incomplete. Need additional imaging evaluation
and/or prior mammograms for comparison.

## 2018-12-06 DIAGNOSIS — Z85828 Personal history of other malignant neoplasm of skin: Secondary | ICD-10-CM | POA: Diagnosis not present

## 2018-12-06 DIAGNOSIS — L821 Other seborrheic keratosis: Secondary | ICD-10-CM | POA: Diagnosis not present

## 2018-12-06 DIAGNOSIS — Z23 Encounter for immunization: Secondary | ICD-10-CM | POA: Diagnosis not present

## 2018-12-06 DIAGNOSIS — L708 Other acne: Secondary | ICD-10-CM | POA: Diagnosis not present

## 2018-12-17 DIAGNOSIS — M810 Age-related osteoporosis without current pathological fracture: Secondary | ICD-10-CM | POA: Diagnosis not present

## 2018-12-17 DIAGNOSIS — Z6837 Body mass index (BMI) 37.0-37.9, adult: Secondary | ICD-10-CM | POA: Diagnosis not present

## 2018-12-17 DIAGNOSIS — M0579 Rheumatoid arthritis with rheumatoid factor of multiple sites without organ or systems involvement: Secondary | ICD-10-CM | POA: Diagnosis not present

## 2018-12-17 DIAGNOSIS — E669 Obesity, unspecified: Secondary | ICD-10-CM | POA: Diagnosis not present

## 2018-12-17 DIAGNOSIS — Z79899 Other long term (current) drug therapy: Secondary | ICD-10-CM | POA: Diagnosis not present

## 2018-12-17 DIAGNOSIS — M5431 Sciatica, right side: Secondary | ICD-10-CM | POA: Diagnosis not present

## 2018-12-26 ENCOUNTER — Ambulatory Visit: Payer: Medicare Other

## 2018-12-26 DIAGNOSIS — M0579 Rheumatoid arthritis with rheumatoid factor of multiple sites without organ or systems involvement: Secondary | ICD-10-CM | POA: Diagnosis not present

## 2019-01-29 ENCOUNTER — Ambulatory Visit: Payer: Medicare Other

## 2019-02-19 ENCOUNTER — Ambulatory Visit: Payer: Medicare Other

## 2019-02-27 DIAGNOSIS — M810 Age-related osteoporosis without current pathological fracture: Secondary | ICD-10-CM | POA: Diagnosis not present

## 2019-02-27 DIAGNOSIS — R7301 Impaired fasting glucose: Secondary | ICD-10-CM | POA: Diagnosis not present

## 2019-02-27 DIAGNOSIS — E7849 Other hyperlipidemia: Secondary | ICD-10-CM | POA: Diagnosis not present

## 2019-02-27 DIAGNOSIS — M0579 Rheumatoid arthritis with rheumatoid factor of multiple sites without organ or systems involvement: Secondary | ICD-10-CM | POA: Diagnosis not present

## 2019-03-06 DIAGNOSIS — I350 Nonrheumatic aortic (valve) stenosis: Secondary | ICD-10-CM | POA: Diagnosis not present

## 2019-03-06 DIAGNOSIS — R05 Cough: Secondary | ICD-10-CM | POA: Diagnosis not present

## 2019-03-06 DIAGNOSIS — M62838 Other muscle spasm: Secondary | ICD-10-CM | POA: Diagnosis not present

## 2019-03-06 DIAGNOSIS — R7301 Impaired fasting glucose: Secondary | ICD-10-CM | POA: Diagnosis not present

## 2019-03-06 DIAGNOSIS — M810 Age-related osteoporosis without current pathological fracture: Secondary | ICD-10-CM | POA: Diagnosis not present

## 2019-03-06 DIAGNOSIS — E538 Deficiency of other specified B group vitamins: Secondary | ICD-10-CM | POA: Diagnosis not present

## 2019-03-06 DIAGNOSIS — D7589 Other specified diseases of blood and blood-forming organs: Secondary | ICD-10-CM | POA: Diagnosis not present

## 2019-03-06 DIAGNOSIS — Z0000 Encounter for general adult medical examination without abnormal findings: Secondary | ICD-10-CM | POA: Diagnosis not present

## 2019-03-06 DIAGNOSIS — Z1331 Encounter for screening for depression: Secondary | ICD-10-CM | POA: Diagnosis not present

## 2019-03-06 DIAGNOSIS — D126 Benign neoplasm of colon, unspecified: Secondary | ICD-10-CM | POA: Diagnosis not present

## 2019-03-06 DIAGNOSIS — M069 Rheumatoid arthritis, unspecified: Secondary | ICD-10-CM | POA: Diagnosis not present

## 2019-03-06 DIAGNOSIS — J309 Allergic rhinitis, unspecified: Secondary | ICD-10-CM | POA: Diagnosis not present

## 2019-03-06 DIAGNOSIS — I2510 Atherosclerotic heart disease of native coronary artery without angina pectoris: Secondary | ICD-10-CM | POA: Diagnosis not present

## 2019-04-01 DIAGNOSIS — M0579 Rheumatoid arthritis with rheumatoid factor of multiple sites without organ or systems involvement: Secondary | ICD-10-CM | POA: Diagnosis not present

## 2019-04-02 DIAGNOSIS — Z79899 Other long term (current) drug therapy: Secondary | ICD-10-CM | POA: Diagnosis not present

## 2019-04-02 DIAGNOSIS — Z6837 Body mass index (BMI) 37.0-37.9, adult: Secondary | ICD-10-CM | POA: Diagnosis not present

## 2019-04-02 DIAGNOSIS — M810 Age-related osteoporosis without current pathological fracture: Secondary | ICD-10-CM | POA: Diagnosis not present

## 2019-04-02 DIAGNOSIS — M5431 Sciatica, right side: Secondary | ICD-10-CM | POA: Diagnosis not present

## 2019-04-02 DIAGNOSIS — E669 Obesity, unspecified: Secondary | ICD-10-CM | POA: Diagnosis not present

## 2019-04-02 DIAGNOSIS — M0579 Rheumatoid arthritis with rheumatoid factor of multiple sites without organ or systems involvement: Secondary | ICD-10-CM | POA: Diagnosis not present

## 2019-04-18 DIAGNOSIS — Z96652 Presence of left artificial knee joint: Secondary | ICD-10-CM | POA: Diagnosis not present

## 2019-04-18 DIAGNOSIS — Z471 Aftercare following joint replacement surgery: Secondary | ICD-10-CM | POA: Diagnosis not present

## 2019-04-19 ENCOUNTER — Ambulatory Visit: Payer: Medicare Other

## 2019-04-29 DIAGNOSIS — M0579 Rheumatoid arthritis with rheumatoid factor of multiple sites without organ or systems involvement: Secondary | ICD-10-CM | POA: Diagnosis not present

## 2019-05-10 ENCOUNTER — Other Ambulatory Visit: Payer: Self-pay

## 2019-05-10 ENCOUNTER — Ambulatory Visit
Admission: RE | Admit: 2019-05-10 | Discharge: 2019-05-10 | Disposition: A | Discharge status: Home / Self Care | Payer: Medicare Other | Source: Ambulatory Visit | Attending: Internal Medicine | Admitting: Internal Medicine

## 2019-05-10 DIAGNOSIS — Z1231 Encounter for screening mammogram for malignant neoplasm of breast: Secondary | ICD-10-CM | POA: Diagnosis not present

## 2019-05-27 DIAGNOSIS — M0579 Rheumatoid arthritis with rheumatoid factor of multiple sites without organ or systems involvement: Secondary | ICD-10-CM | POA: Diagnosis not present

## 2019-06-24 DIAGNOSIS — Z79899 Other long term (current) drug therapy: Secondary | ICD-10-CM | POA: Diagnosis not present

## 2019-06-24 DIAGNOSIS — M0579 Rheumatoid arthritis with rheumatoid factor of multiple sites without organ or systems involvement: Secondary | ICD-10-CM | POA: Diagnosis not present

## 2019-07-01 DIAGNOSIS — Z961 Presence of intraocular lens: Secondary | ICD-10-CM | POA: Diagnosis not present

## 2019-07-01 DIAGNOSIS — H524 Presbyopia: Secondary | ICD-10-CM | POA: Diagnosis not present

## 2019-07-01 DIAGNOSIS — H04123 Dry eye syndrome of bilateral lacrimal glands: Secondary | ICD-10-CM | POA: Diagnosis not present

## 2019-07-22 DIAGNOSIS — M0579 Rheumatoid arthritis with rheumatoid factor of multiple sites without organ or systems involvement: Secondary | ICD-10-CM | POA: Diagnosis not present

## 2019-09-10 DIAGNOSIS — R030 Elevated blood-pressure reading, without diagnosis of hypertension: Secondary | ICD-10-CM | POA: Diagnosis not present

## 2019-09-10 DIAGNOSIS — M810 Age-related osteoporosis without current pathological fracture: Secondary | ICD-10-CM | POA: Diagnosis not present

## 2019-09-10 DIAGNOSIS — I350 Nonrheumatic aortic (valve) stenosis: Secondary | ICD-10-CM | POA: Diagnosis not present

## 2019-09-10 DIAGNOSIS — K219 Gastro-esophageal reflux disease without esophagitis: Secondary | ICD-10-CM | POA: Diagnosis not present

## 2019-09-10 DIAGNOSIS — N390 Urinary tract infection, site not specified: Secondary | ICD-10-CM | POA: Diagnosis not present

## 2019-09-10 DIAGNOSIS — E538 Deficiency of other specified B group vitamins: Secondary | ICD-10-CM | POA: Diagnosis not present

## 2019-09-10 DIAGNOSIS — M25562 Pain in left knee: Secondary | ICD-10-CM | POA: Diagnosis not present

## 2019-09-16 DIAGNOSIS — Z79899 Other long term (current) drug therapy: Secondary | ICD-10-CM | POA: Diagnosis not present

## 2019-09-16 DIAGNOSIS — M0579 Rheumatoid arthritis with rheumatoid factor of multiple sites without organ or systems involvement: Secondary | ICD-10-CM | POA: Diagnosis not present

## 2019-10-21 DIAGNOSIS — M0579 Rheumatoid arthritis with rheumatoid factor of multiple sites without organ or systems involvement: Secondary | ICD-10-CM | POA: Diagnosis not present

## 2019-10-24 DIAGNOSIS — Z471 Aftercare following joint replacement surgery: Secondary | ICD-10-CM | POA: Diagnosis not present

## 2019-10-24 DIAGNOSIS — Z96652 Presence of left artificial knee joint: Secondary | ICD-10-CM | POA: Diagnosis not present

## 2019-11-18 DIAGNOSIS — M0579 Rheumatoid arthritis with rheumatoid factor of multiple sites without organ or systems involvement: Secondary | ICD-10-CM | POA: Diagnosis not present

## 2019-11-23 DIAGNOSIS — Z23 Encounter for immunization: Secondary | ICD-10-CM | POA: Diagnosis not present

## 2019-12-09 DIAGNOSIS — Z23 Encounter for immunization: Secondary | ICD-10-CM | POA: Diagnosis not present

## 2019-12-17 DIAGNOSIS — M0579 Rheumatoid arthritis with rheumatoid factor of multiple sites without organ or systems involvement: Secondary | ICD-10-CM | POA: Diagnosis not present

## 2019-12-24 DIAGNOSIS — E538 Deficiency of other specified B group vitamins: Secondary | ICD-10-CM | POA: Diagnosis not present

## 2019-12-26 DIAGNOSIS — Z85828 Personal history of other malignant neoplasm of skin: Secondary | ICD-10-CM | POA: Diagnosis not present

## 2019-12-26 DIAGNOSIS — L821 Other seborrheic keratosis: Secondary | ICD-10-CM | POA: Diagnosis not present

## 2019-12-26 DIAGNOSIS — L578 Other skin changes due to chronic exposure to nonionizing radiation: Secondary | ICD-10-CM | POA: Diagnosis not present

## 2020-01-14 DIAGNOSIS — M0579 Rheumatoid arthritis with rheumatoid factor of multiple sites without organ or systems involvement: Secondary | ICD-10-CM | POA: Diagnosis not present

## 2020-01-15 DIAGNOSIS — Z6836 Body mass index (BMI) 36.0-36.9, adult: Secondary | ICD-10-CM | POA: Diagnosis not present

## 2020-01-15 DIAGNOSIS — M5431 Sciatica, right side: Secondary | ICD-10-CM | POA: Diagnosis not present

## 2020-01-15 DIAGNOSIS — M810 Age-related osteoporosis without current pathological fracture: Secondary | ICD-10-CM | POA: Diagnosis not present

## 2020-01-15 DIAGNOSIS — E669 Obesity, unspecified: Secondary | ICD-10-CM | POA: Diagnosis not present

## 2020-01-15 DIAGNOSIS — M0579 Rheumatoid arthritis with rheumatoid factor of multiple sites without organ or systems involvement: Secondary | ICD-10-CM | POA: Diagnosis not present

## 2020-01-15 DIAGNOSIS — Z79899 Other long term (current) drug therapy: Secondary | ICD-10-CM | POA: Diagnosis not present

## 2020-01-15 DIAGNOSIS — I350 Nonrheumatic aortic (valve) stenosis: Secondary | ICD-10-CM | POA: Diagnosis not present

## 2020-01-15 DIAGNOSIS — M7989 Other specified soft tissue disorders: Secondary | ICD-10-CM | POA: Diagnosis not present

## 2020-03-02 DIAGNOSIS — M0579 Rheumatoid arthritis with rheumatoid factor of multiple sites without organ or systems involvement: Secondary | ICD-10-CM | POA: Diagnosis not present

## 2020-03-02 DIAGNOSIS — Z79899 Other long term (current) drug therapy: Secondary | ICD-10-CM | POA: Diagnosis not present

## 2020-03-17 DIAGNOSIS — E785 Hyperlipidemia, unspecified: Secondary | ICD-10-CM | POA: Diagnosis not present

## 2020-03-17 DIAGNOSIS — E538 Deficiency of other specified B group vitamins: Secondary | ICD-10-CM | POA: Diagnosis not present

## 2020-03-17 DIAGNOSIS — R7301 Impaired fasting glucose: Secondary | ICD-10-CM | POA: Diagnosis not present

## 2020-03-17 DIAGNOSIS — M810 Age-related osteoporosis without current pathological fracture: Secondary | ICD-10-CM | POA: Diagnosis not present

## 2020-03-24 DIAGNOSIS — N3090 Cystitis, unspecified without hematuria: Secondary | ICD-10-CM | POA: Diagnosis not present

## 2020-03-24 DIAGNOSIS — I2510 Atherosclerotic heart disease of native coronary artery without angina pectoris: Secondary | ICD-10-CM | POA: Diagnosis not present

## 2020-03-24 DIAGNOSIS — M069 Rheumatoid arthritis, unspecified: Secondary | ICD-10-CM | POA: Diagnosis not present

## 2020-03-24 DIAGNOSIS — R82998 Other abnormal findings in urine: Secondary | ICD-10-CM | POA: Diagnosis not present

## 2020-03-24 DIAGNOSIS — Z0000 Encounter for general adult medical examination without abnormal findings: Secondary | ICD-10-CM | POA: Diagnosis not present

## 2020-03-24 DIAGNOSIS — E785 Hyperlipidemia, unspecified: Secondary | ICD-10-CM | POA: Diagnosis not present

## 2020-03-24 DIAGNOSIS — R011 Cardiac murmur, unspecified: Secondary | ICD-10-CM | POA: Diagnosis not present

## 2020-03-24 DIAGNOSIS — E538 Deficiency of other specified B group vitamins: Secondary | ICD-10-CM | POA: Diagnosis not present

## 2020-03-24 DIAGNOSIS — M810 Age-related osteoporosis without current pathological fracture: Secondary | ICD-10-CM | POA: Diagnosis not present

## 2020-03-24 DIAGNOSIS — R030 Elevated blood-pressure reading, without diagnosis of hypertension: Secondary | ICD-10-CM | POA: Diagnosis not present

## 2020-03-24 DIAGNOSIS — I350 Nonrheumatic aortic (valve) stenosis: Secondary | ICD-10-CM | POA: Diagnosis not present

## 2020-04-16 ENCOUNTER — Other Ambulatory Visit: Payer: Self-pay | Admitting: Internal Medicine

## 2020-04-16 DIAGNOSIS — Z1231 Encounter for screening mammogram for malignant neoplasm of breast: Secondary | ICD-10-CM

## 2020-04-20 DIAGNOSIS — M0579 Rheumatoid arthritis with rheumatoid factor of multiple sites without organ or systems involvement: Secondary | ICD-10-CM | POA: Diagnosis not present

## 2020-04-28 DIAGNOSIS — R82998 Other abnormal findings in urine: Secondary | ICD-10-CM | POA: Diagnosis not present

## 2020-05-15 DIAGNOSIS — Z96653 Presence of artificial knee joint, bilateral: Secondary | ICD-10-CM | POA: Diagnosis not present

## 2020-05-19 DIAGNOSIS — M0579 Rheumatoid arthritis with rheumatoid factor of multiple sites without organ or systems involvement: Secondary | ICD-10-CM | POA: Diagnosis not present

## 2020-06-09 ENCOUNTER — Ambulatory Visit: Payer: Medicare Other

## 2020-06-09 DIAGNOSIS — Z23 Encounter for immunization: Secondary | ICD-10-CM | POA: Diagnosis not present

## 2020-06-16 ENCOUNTER — Ambulatory Visit: Payer: Medicare Other | Admitting: Cardiology

## 2020-06-23 DIAGNOSIS — M0579 Rheumatoid arthritis with rheumatoid factor of multiple sites without organ or systems involvement: Secondary | ICD-10-CM | POA: Diagnosis not present

## 2020-07-03 DIAGNOSIS — H353132 Nonexudative age-related macular degeneration, bilateral, intermediate dry stage: Secondary | ICD-10-CM | POA: Diagnosis not present

## 2020-07-03 DIAGNOSIS — H524 Presbyopia: Secondary | ICD-10-CM | POA: Diagnosis not present

## 2020-07-03 DIAGNOSIS — Z961 Presence of intraocular lens: Secondary | ICD-10-CM | POA: Diagnosis not present

## 2020-07-14 ENCOUNTER — Ambulatory Visit: Payer: Medicare Other | Admitting: Cardiology

## 2020-07-20 DIAGNOSIS — Z6837 Body mass index (BMI) 37.0-37.9, adult: Secondary | ICD-10-CM | POA: Diagnosis not present

## 2020-07-20 DIAGNOSIS — M7989 Other specified soft tissue disorders: Secondary | ICD-10-CM | POA: Diagnosis not present

## 2020-07-20 DIAGNOSIS — I350 Nonrheumatic aortic (valve) stenosis: Secondary | ICD-10-CM | POA: Diagnosis not present

## 2020-07-20 DIAGNOSIS — M810 Age-related osteoporosis without current pathological fracture: Secondary | ICD-10-CM | POA: Diagnosis not present

## 2020-07-20 DIAGNOSIS — E669 Obesity, unspecified: Secondary | ICD-10-CM | POA: Diagnosis not present

## 2020-07-20 DIAGNOSIS — M5431 Sciatica, right side: Secondary | ICD-10-CM | POA: Diagnosis not present

## 2020-07-20 DIAGNOSIS — M0579 Rheumatoid arthritis with rheumatoid factor of multiple sites without organ or systems involvement: Secondary | ICD-10-CM | POA: Diagnosis not present

## 2020-07-20 DIAGNOSIS — Z79899 Other long term (current) drug therapy: Secondary | ICD-10-CM | POA: Diagnosis not present

## 2020-07-21 DIAGNOSIS — Z79899 Other long term (current) drug therapy: Secondary | ICD-10-CM | POA: Diagnosis not present

## 2020-07-21 DIAGNOSIS — R5383 Other fatigue: Secondary | ICD-10-CM | POA: Diagnosis not present

## 2020-07-21 DIAGNOSIS — M0579 Rheumatoid arthritis with rheumatoid factor of multiple sites without organ or systems involvement: Secondary | ICD-10-CM | POA: Diagnosis not present

## 2020-07-28 ENCOUNTER — Other Ambulatory Visit: Payer: Self-pay

## 2020-07-28 ENCOUNTER — Ambulatory Visit
Admission: RE | Admit: 2020-07-28 | Discharge: 2020-07-28 | Disposition: A | Discharge status: Home / Self Care | Payer: Medicare Other | Source: Ambulatory Visit | Attending: Internal Medicine | Admitting: Internal Medicine

## 2020-07-28 DIAGNOSIS — Z1231 Encounter for screening mammogram for malignant neoplasm of breast: Secondary | ICD-10-CM | POA: Diagnosis not present

## 2020-08-13 ENCOUNTER — Other Ambulatory Visit: Payer: Self-pay

## 2020-08-13 ENCOUNTER — Encounter: Payer: Self-pay | Admitting: Cardiovascular Disease

## 2020-08-13 ENCOUNTER — Ambulatory Visit (INDEPENDENT_AMBULATORY_CARE_PROVIDER_SITE_OTHER): Payer: Medicare Other | Admitting: Cardiovascular Disease

## 2020-08-13 VITALS — BP 124/82 | HR 80 | Ht 64.0 in | Wt 217.0 lb

## 2020-08-13 DIAGNOSIS — R931 Abnormal findings on diagnostic imaging of heart and coronary circulation: Secondary | ICD-10-CM

## 2020-08-13 DIAGNOSIS — E782 Mixed hyperlipidemia: Secondary | ICD-10-CM | POA: Diagnosis not present

## 2020-08-13 DIAGNOSIS — M069 Rheumatoid arthritis, unspecified: Secondary | ICD-10-CM

## 2020-08-13 DIAGNOSIS — I700 Atherosclerosis of aorta: Secondary | ICD-10-CM

## 2020-08-13 DIAGNOSIS — E785 Hyperlipidemia, unspecified: Secondary | ICD-10-CM

## 2020-08-13 DIAGNOSIS — I350 Nonrheumatic aortic (valve) stenosis: Secondary | ICD-10-CM

## 2020-08-13 NOTE — Progress Notes (Signed)
Cardiology Office Note    Date:  08/19/2020   ID:  [name], DOB 07-07-1935, MRN [id number]  PCP:  Katherine Infante, MD  Cardiologist:  Katherine Majestic, MD    New cardiology evaluation referred through the courtesy of Dr. Carlynn Ellis for further evaluation of aortic stenosis, CAD, and hypertension.  History of Present Illness:  [name] is a 85 y.o. female who has been followed by Dr. Elta Guadeloupe Ellis for primary care.  She has a history of documented coronary calcification on CT cardiac scoring from January 24, 2018 which showed a total Eggleston score of 505 with extensive calcifications throughout the LAD.  Calcifications were also noted in the proximal circumflex and in the mid RCA.  There is also evidence for aortic atherosclerosis.  She has undergone prior 2D echo Doppler imaging in December 2019 which showed normal systolic function with EF at 60 to 65% without wall motion abnormalities.  Aortic valve was moderately calcified and trileaflet.  There was mild aortic stenosis with a mean gradient of 15 and peak gradient of 37 mmHg. She denies any history of chest pain or exertional dyspnea.  There is no history of presyncope or syncope and she is unaware of any rhythm abnormalities.  She has a history of hyperlipidemia and recently her rosuvastatin dose was increased from 10 mg up to 20 mg after her LDL cholesterol was 85 in February 2022.  She has not had follow-up laboratory since medication increase.  She is now referred for status 1 of cardiology care.  Past Medical History:  Diagnosis Date   Arthritis    rheumatoid   PONV (postoperative nausea and vomiting)    none recently   Shingles 04/21/14   seen by Dr [name] and treated with Valtrex    Past Surgical History:  Procedure Laterality Date   ABDOMINAL HYSTERECTOMY  1984   ovarys left in   BREAST BIOPSY Right 2018   CHOLECYSTECTOMY  2005   [place]   right knee    KNEE RECONSTRUCTION Left 09/26/12   left knee replacement  2012   x2 surgeries-ruptures patella tendon, fractured patella   PATELLAR TENDON REPAIR  07/21/2011   Procedure: PATELLA TENDON REPAIR;  Surgeon: Katherine Alf, MD;  Location: [facility];  Service: Orthopedics;  Laterality: Left;  Left Knee Patella Tendon Reconstruction with allograft   QUADRICEPS TENDON REPAIR Left 09/26/2012   Procedure: LEFT EXTENSOR MECHANISM RECONSTRUCTION/WITH GRAFT;  Surgeon: Katherine Alf, MD;  Location: [facility];  Service: Orthopedics;  Laterality: Left;   REPAIR EXTENSOR TENDON Right 03/05/2015   Procedure: RECONSTRUCTION EXTENSOR HOOD RIGHT MIDDLE FINGER;  Surgeon: Katherine Brod, MD;  Location: [place];  Service: Orthopedics;  Laterality: Right;  ANESTHESIA:  IV REGIONAL FAB   TONSILLECTOMY     as child   wrist bilateral  [phone]   for arthritis    Current Medications: Outpatient Medications Prior to Visit  Medication Sig Dispense Refill   acetaminophen (TYLENOL) 500 MG tablet Take 1,000 mg by mouth every 6 (six) hours as needed. For headache     diphenhydrAMINE (SOMINEX) 25 MG tablet Take 25 mg by mouth as needed for allergies or sleep (take 12.5 to 25 mg as needed).     estrogens, conjugated, (PREMARIN) 0.3 MG tablet Take 0.3 mg by mouth daily with breakfast.      folic acid (FOLVITE) 1 MG tablet Take 1 mg by mouth  daily with breakfast.     Methotrexate Sodium (METHOTREXATE PO) Take 2.5 mg by mouth. 3 tablets on Saturday and 3 tablets on Sunday     Multiple Vitamins-Minerals (PRESERVISION AREDS 2 PO) Take by mouth 2 (two) times daily.     naphazoline-glycerin (CLEAR EYES) 0.012-0.2 % SOLN Place 1-2 drops into both eyes every 4 (four) hours as needed (dry eyes).     OVER THE COUNTER MEDICATION Take 1 tablet by mouth 2 (two) times daily. Algae-Cal=788m Calcium, 674mMagnesium, and 1000 units of Vitamin D     oxymetazoline (AFRIN) 0.05 % nasal spray Place 2 sprays into the nose as needed. For  congestion     Propylene Glycol (SYSTANE BALANCE OP) Apply 1 drop to eye as needed.     rosuvastatin (CRESTOR) 20 MG tablet Take 20 mg by mouth 3 (three) times a week.     tocilizumab (ACTEMRA) 400 MG/20ML SOLN injection Inject into the vein once.     traMADol (ULTRAM) 50 MG tablet Take 1-2 tablets (50-100 mg total) by mouth every 6 (six) hours as needed. 80 tablet 1   vitamin C (ASCORBIC ACID) 500 MG tablet Take 500 mg by mouth as needed.      VITAMIN D, ERGOCALCIFEROL, PO Take 10,000 Units by mouth 3 (three) times a week. On Wednesdays     beta carotene w/minerals (OCUVITE) tablet Take 1 tablet by mouth daily. Actually takes equivalent "preservision"     fish oil-omega-3 fatty acids 1000 MG capsule Take 1 g by mouth 2 (two) times daily.     HYDROcodone-acetaminophen (NORCO) 5-325 MG tablet Take 1 tablet by mouth every 6 (six) hours as needed for moderate pain. 30 tablet 0   ibandronate (BONIVA) 150 MG tablet Take 150 mg by mouth every 30 (thirty) days. Take in the morning with a full glass of water, on an empty stomach, and do not take anything else by mouth or lie down for the next 30 min.     Propylene Glycol (SYSTANE BALANCE OP) Apply 1 drop to eye as needed.     rosuvastatin (CRESTOR) 10 MG tablet Take 10 mg by mouth 3 (three) times a week.      No facility-administered medications prior to visit.     Allergies:   Morphine and related and Sulfa antibiotics   Social History   Socioeconomic History   Marital status: Divorced    Spouse name: Not on file   Number of children: Not on file   Years of education: Not on file   Highest education level: Not on file  Occupational History   Not on file  Tobacco Use   Smoking status: Former    Packs/day: 0.50    Years: 15.00    Pack years: 7.50    Types: Cigarettes    Quit date: 02/07/2005    Years since quitting: 15.5   Smokeless tobacco: Never  Substance and Sexual Activity   Alcohol use: No   Drug use: No   Sexual activity: Not on  file  Other Topics Concern   Not on file  Social History Narrative   Not on file   Social Determinants of Health   Financial Resource Strain: Not on file  Food Insecurity: Not on file  Transportation Needs: Not on file  Physical Activity: Not on file  Stress: Not on file  Social Connections: Not on file    Additional social history is notable that she was born in [place]  She has been divorced since 1984.  She has 3 children and 6 grandchildren.  She lives by herself.  She walks with a walker.  Previously she was a Music therapist at [employer].  She attended college at [employer] today.  There is remote tobacco history but she quit smoking in 2000.  She exercises on a recumbent bike 2-3 times per week for approximately 20 minutes.  Family History: Her mother died at age 50 and had throat cancer.  Her father died at age 12 and had lung cancer.  She has a sister age 33.  She has 3 sons ages 27, 38, and 85.  ROS General: Negative; No fevers, chills, or night sweats;  HEENT: Negative; No changes in vision or hearing, sinus congestion, difficulty swallowing Pulmonary: Negative; No cough, wheezing, shortness of breath, hemoptysis Cardiovascular: See HPI GI: Negative; No nausea, vomiting, diarrhea, or abdominal pain GU: Negative; No dysuria, hematuria, or difficulty voiding Musculoskeletal: In 2011 she broke her left kneecap.  She has undergone 6 knee surgeries in 4 years.  She has a history of rheumatoid arthritis and had seen Dr. Charlestine Night and most recently sees Dr. Amil Amen Hematologic/Oncology: Negative; no easy bruising, bleeding Endocrine: Negative; no heat/cold intolerance; no diabetes Neuro: Negative; no changes in balance, headaches Skin: Negative; No rashes or skin lesions Psychiatric: Negative; No behavioral problems, depression Sleep: Negative; No snoring, daytime sleepiness, hypersomnolence, bruxism, restless legs, hypnogognic  hallucinations, no cataplexy Other comprehensive 14 point system review is negative.   PHYSICAL EXAM:   VS:  BP 124/82   Pulse 80   Ht 5' 4"  (1.626 m)   Wt 217 lb (98.4 kg)   BMI 37.25 kg/m     Repeat blood pressure by me was 148/80 in the left arm and 146/80 in the right arm.  Wt Readings from Last 3 Encounters:  08/13/20 217 lb (98.4 kg)  06/01/16 220 lb 6.4 oz (100 kg)  05/04/16 216 lb 3.2 oz (98.1 kg)    General: Alert, oriented, no distress.  Skin: normal turgor, no rashes, warm and dry HEENT: Normocephalic, atraumatic. Pupils equal round and reactive to light; sclera anicteric; extraocular muscles intact; Nose without nasal septal hypertrophy Mouth/Parynx benign; Mallinpatti scale Neck: No JVD, probable transmitted aortic murmur Lungs: clear to ausculatation and percussion; no wheezing or rales Chest wall: without tenderness to palpitation Heart: PMI not displaced, RRR, s1 s2 normal, 2-3/6 harsh mid-to-late peaking systolic murmur transmitting to her carotids no diastolic murmur, no rubs, gallops, thrills, or heaves Abdomen: soft, nontender; no hepatosplenomehaly, BS+; abdominal aorta nontender and not dilated by palpation. Back: no CVA tenderness Pulses 2+ Musculoskeletal: full range of motion, normal strength, no joint deformities Extremities: no clubbing cyanosis or edema, Homan's sign negative  Neurologic: grossly nonfocal; Cranial nerves grossly wnl Psychologic: Normal mood and affect   Studies/Labs Reviewed:   EKG:  EKG is ordered today.  ECG (independently read by me): Normal sinus rhythm at 80 bpm, nonspecific ST T wave abnormality in lead aVL.  First-degree AV block with appearing to 80 ms.  No ectopy.  Recent Labs: BMP Latest Ref Rng & Units 02/10/2016 07/22/2011 07/15/2011  Glucose 65 - 99 mg/dL 115(H) 192(H) 97  BUN 6 - 20 mg/dL 11 6 14   Creatinine 0.44 - 1.00 mg/dL 0.44 0.37(L) 0.42(L)  Sodium 135 - 145 mmol/L 137 137 136  Potassium 3.5 - 5.1 mmol/L 4.0  4.1 4.0  Chloride 101 - 111 mmol/L 102 104 100  CO2 22 - 32 mmol/L  28 25 26   Calcium 8.9 - 10.3 mg/dL 8.7(L) 8.7 9.7     Hepatic Function Latest Ref Rng & Units 02/10/2016 07/15/2011 08/20/2010  Total Protein 6.5 - 8.1 g/dL 6.4(L) 7.0 7.3  Albumin 3.5 - 5.0 g/dL 3.9 3.5 3.8  AST 15 - 41 U/L 16 10 13   ALT 14 - 54 U/L 12(L) 10 8  Alk Phosphatase 38 - 126 U/L 46 55 60  Total Bilirubin 0.3 - 1.2 mg/dL 0.7 0.4 0.3    CBC Latest Ref Rng & Units 02/10/2016 09/19/2012 07/23/2011  WBC 4.0 - 10.5 K/uL 8.3 7.6 10.4  Hemoglobin 12.0 - 15.0 g/dL 13.0 13.1 11.7(L)  Hematocrit 36.0 - 46.0 % 39.5 39.8 35.2(L)  Platelets 150 - 400 K/uL 207 281 327   Lab Results  Component Value Date   MCV 99.0 02/10/2016   MCV 101.5 (H) 09/19/2012   MCV 101.1 (H) 07/23/2011   No results found for: TSH No results found for: HGBA1C   BNP No results found for: BNP  ProBNP No results found for: PROBNP   Lipid Panel  No results found for: CHOL, TRIG, HDL, CHOLHDL, VLDL, LDLCALC, LDLDIRECT, LABVLDL   RADIOLOGY: MM 3D SCREEN BREAST BILATERAL  Result Date: 07/29/2020 CLINICAL DATA:  Screening. EXAM: DIGITAL SCREENING BILATERAL MAMMOGRAM WITH TOMOSYNTHESIS AND CAD TECHNIQUE: Bilateral screening digital craniocaudal and mediolateral oblique mammograms were obtained. Bilateral screening digital breast tomosynthesis was performed. The images were evaluated with computer-aided detection. COMPARISON:  Previous exam(s). ACR Breast Density Category b: There are scattered areas of fibroglandular density. FINDINGS: There are no findings suspicious for malignancy. IMPRESSION: No mammographic evidence of malignancy. A result letter of this screening mammogram will be mailed directly to the patient. RECOMMENDATION: Screening mammogram in one year. (Code:SM-B-01Y) BI-RADS CATEGORY  1: Negative. Electronically Signed   By: Abelardo Diesel M.D.   On: 07/29/2020 14:51     Additional studies/ records that were reviewed today include:    I reviewed the comprehensive records of Dr. Elta Guadeloupe Ellis.  ECHO 01/28/2018 Study Conclusions  - Left ventricle: The cavity size was normal. Systolic function was    normal. The estimated ejection fraction was in the range of 60%    to 65%. Wall motion was normal; there were no regional wall    motion abnormalities. The study is indeterminate for the grading    of LV diastolic function.  - Aortic valve: Moderately calcified annulus. Probably trileaflet;    moderately thickened, moderately calcified leaflets. Right    coronary cusp mobility was moderately restricted. There was mild    stenosis.  - Mitral valve: Calcified annulus. There was trivial regurgitation.  - Right ventricle: Systolic function was normal.  - Atrial septum: No defect or patent foramen ovale was identified.  - Tricuspid valve: There was no significant regurgitation.   Impressions:   - Normal LV EF. Thickened, calcified aortic valve and ascending    aorta with mild stenosis.    CT HEART: 01/24/2018 CORONARY CALCIUM   Total Agatston Score: 505 with extensive calcifications throughout the left anterior descending coronary artery. Calcifications also noted in the proximal left circumflex and in the mid RCA   MESA database percentile:  81   OTHER FINDINGS:   Cardiovascular: Heart is normal size. Aorta is normal caliber. Scattered aortic calcifications.   Mediastinum/Nodes: No adenopathy in the lower mediastinum or hila.   Lungs/Pleura: Peripheral interstitial prominence compatible with scarring in the lung bases. No effusions or confluent opacities.   Upper Abdomen: Imaging into  the upper abdomen shows no acute findings.   Musculoskeletal: Chest wall soft tissues are unremarkable. No acute bony abnormality.   IMPRESSION: The observed calcium score of 505 is at the percentile 81 for subjects of the same age, gender and race/ethnicity who are free of clinical cardiovascular disease and treated  diabetes.   No acute extra cardiac abnormality.   Aortic atherosclerosis.      ASSESSMENT:    1. Nonrheumatic aortic valve stenosis   2. Elevated coronary artery calcium score   3. Aortic atherosclerosis ([place])   4. Mixed hyperlipidemia   5. Rheumatoid arthritis involving multiple sites, unspecified whether rheumatoid factor present [facility])     PLAN:  Mrs. Nicloe Frontera is an 85 year old female patient who has been followed a former patient of Dr. Sharene Butters as well as Dr. Electa Sniff.  She has been followed by Dr. Crist Ellis for many years.  She has been documented to have an elevated coronary calcium score of 505 agatston units on CT imaging in December 2019 with extensive calcification throughout the LAD and also calcification present in the proximal left circumflex and in the mid RCA.  She has a history of hyperlipidemia, and most recent lipid studies in February 2022 showed total cholesterol 179, HDL 60, triglycerides 171, and LDL 85.  At the time she was on rosuvastatin 10 mg which was increased to 20 mg.  Her physical examination suggests a murmur of aortic valve stenosis.  Her last echo Doppler study was in 2019.  On exam today, her murmur is 2-3 over 6 and is harsh and at least mid peaking with transmission to her carotids.  I am scheduling her for a follow-up echo Doppler study for further evaluation.  Her blood pressure today was stable at 124/82 upon presentation but at times she has had some blood pressure lability.  She has rheumatoid arthritis and is currently treated with methotrexate in addition to tocilizumab.  Her ECG shows sinus rhythm with first-degree AV block and nonspecific ST changes in lead aVL.  She does not have LVH or significant strain pattern.  She uses a walker to ambulate for improved balance and is status post 6 knee surgeries over the past 4 years.   Medication Adjustments/Labs and Tests Ordered: Current medicines are reviewed at length with the patient today.   Concerns regarding medicines are outlined above.  Medication changes, Labs and Tests ordered today are listed in the Patient Instructions below. Patient Instructions  Medication Instructions:  Your physician recommends that you continue on your current medications as directed. Please refer to the Current Medication list given to you today.  *If you need a refill on your cardiac medications before your next appointment, please call your pharmacy*   Lab Work: Please return for FASTING labs (CMET, Lipid)  Our in office lab hours are Monday-Friday 8:00-4:00, closed for lunch 12:45-1:45 pm.  No appointment needed.  Testing/Procedures: Your physician has requested that you have an echocardiogram. Echocardiography is a painless test that uses sound waves to create images of your heart. It provides your doctor with information about the size and shape of your heart and how well your heart's chambers and valves are working. This procedure takes approximately one hour. There are no restrictions for this procedure. This will be done at our [facility] location:  [place]: At [facility], you and your health needs are our priority.  As part of our continuing mission to provide you with exceptional  heart care, we have created designated Provider Care Teams.  These Care Teams include your primary Cardiologist (physician) and Advanced Practice Providers (APPs -  Physician Assistants and Nurse Practitioners) who all work together to provide you with the care you need, when you need it.  We recommend signing up for the patient portal called "MyChart".  Sign up information is provided on this After Visit Summary.  MyChart is used to connect with patients for Virtual Visits (Telemedicine).  Patients are able to view lab/test results, encounter notes, upcoming appointments, etc.  Non-urgent messages can be sent to your provider as well.   To learn more about what you can do  with MyChart, go to [email or web address].    Your next appointment:   2-3 month(s)  The format for your next appointment:   In Person  Provider:   Shelva Majestic, MD    Signed, Katherine Majestic, MD  08/19/2020 3:43 PM    [place] [address] [place], [place], [place]  [address] Phone: [phone]

## 2020-08-13 NOTE — Patient Instructions (Signed)
Medication Instructions:  Your physician recommends that you continue on your current medications as directed. Please refer to the Current Medication list given to you today.  *If you need a refill on your cardiac medications before your next appointment, please call your pharmacy*   Lab Work: Please return for FASTING labs (CMET, Lipid)  Our in office lab hours are Monday-Friday 8:00-4:00, closed for lunch 12:45-1:45 pm.  No appointment needed.  Testing/Procedures: Your physician has requested that you have an echocardiogram. Echocardiography is a painless test that uses sound waves to create images of your heart. It provides your doctor with information about the size and shape of your heart and how well your heart's chambers and valves are working. This procedure takes approximately one hour. There are no restrictions for this procedure. This will be done at our [facility] location:  [place]: At [facility], you and your health needs are our priority.  As part of our continuing mission to provide you with exceptional heart care, we have created designated Provider Care Teams.  These Care Teams include your primary Cardiologist (physician) and Advanced Practice Providers (APPs -  Physician Assistants and Nurse Practitioners) who all work together to provide you with the care you need, when you need it.  We recommend signing up for the patient portal called "MyChart".  Sign up information is provided on this After Visit Summary.  MyChart is used to connect with patients for Virtual Visits (Telemedicine).  Patients are able to view lab/test results, encounter notes, upcoming appointments, etc.  Non-urgent messages can be sent to your provider as well.   To learn more about what you can do with MyChart, go to [email or web address].    Your next appointment:   2-3 month(s)  The format for your next appointment:   In Person  Provider:   Shelva Majestic,  MD

## 2020-08-19 ENCOUNTER — Encounter: Payer: Self-pay | Admitting: Cardiovascular Disease

## 2020-08-24 DIAGNOSIS — M0579 Rheumatoid arthritis with rheumatoid factor of multiple sites without organ or systems involvement: Secondary | ICD-10-CM | POA: Diagnosis not present

## 2020-09-08 ENCOUNTER — Other Ambulatory Visit: Payer: Self-pay

## 2020-09-08 ENCOUNTER — Ambulatory Visit (HOSPITAL_COMMUNITY): Payer: Medicare Other | Attending: Cardiology

## 2020-09-08 DIAGNOSIS — I350 Nonrheumatic aortic (valve) stenosis: Secondary | ICD-10-CM | POA: Diagnosis not present

## 2020-09-08 LAB — ECHOCARDIOGRAM COMPLETE
AR max vel: 1.1 cm2
AV Area VTI: 1.31 cm2
AV Area mean vel: 1.1 cm2
AV Mean grad: 27.7 mmHg
AV Peak grad: 53.5 mmHg
Ao pk vel: 3.66 m/s
Area-P 1/2: 3.93 cm2
S' Lateral: 2.6 cm

## 2020-09-15 DIAGNOSIS — E785 Hyperlipidemia, unspecified: Secondary | ICD-10-CM | POA: Diagnosis not present

## 2020-09-15 DIAGNOSIS — I350 Nonrheumatic aortic (valve) stenosis: Secondary | ICD-10-CM | POA: Diagnosis not present

## 2020-09-16 LAB — COMPREHENSIVE METABOLIC PANEL
ALT: 10 IU/L (ref 0–32)
AST: 17 IU/L (ref 0–40)
Albumin/Globulin Ratio: 2.3 — ABNORMAL HIGH (ref 1.2–2.2)
Albumin: 4.3 g/dL (ref 3.6–4.6)
Alkaline Phosphatase: 44 IU/L (ref 44–121)
BUN/Creatinine Ratio: 23 (ref 12–28)
BUN: 11 mg/dL (ref 8–27)
Bilirubin Total: 0.8 mg/dL (ref 0.0–1.2)
CO2: 29 mmol/L (ref 20–29)
Calcium: 8.8 mg/dL (ref 8.7–10.3)
Chloride: 100 mmol/L (ref 96–106)
Creatinine, Ser: 0.48 mg/dL — ABNORMAL LOW (ref 0.57–1.00)
Globulin, Total: 1.9 g/dL (ref 1.5–4.5)
Glucose: 94 mg/dL (ref 65–99)
Potassium: 4.5 mmol/L (ref 3.5–5.2)
Sodium: 140 mmol/L (ref 134–144)
Total Protein: 6.2 g/dL (ref 6.0–8.5)
eGFR: 95 mL/min/{1.73_m2} (ref >59)

## 2020-09-16 LAB — LIPID PANEL
Chol/HDL Ratio: 2.7 ratio (ref 0.0–4.4)
Cholesterol, Total: 160 mg/dL (ref 100–199)
HDL: 59 mg/dL (ref >39)
LDL Chol Calc (NIH): 73 mg/dL (ref 0–99)
Triglycerides: 169 mg/dL — ABNORMAL HIGH (ref 0–149)
VLDL Cholesterol Cal: 28 mg/dL (ref 5–40)

## 2020-09-21 DIAGNOSIS — M069 Rheumatoid arthritis, unspecified: Secondary | ICD-10-CM | POA: Diagnosis not present

## 2020-09-21 DIAGNOSIS — E538 Deficiency of other specified B group vitamins: Secondary | ICD-10-CM | POA: Diagnosis not present

## 2020-09-21 DIAGNOSIS — I2510 Atherosclerotic heart disease of native coronary artery without angina pectoris: Secondary | ICD-10-CM | POA: Diagnosis not present

## 2020-09-21 DIAGNOSIS — E785 Hyperlipidemia, unspecified: Secondary | ICD-10-CM | POA: Diagnosis not present

## 2020-09-21 DIAGNOSIS — M810 Age-related osteoporosis without current pathological fracture: Secondary | ICD-10-CM | POA: Diagnosis not present

## 2020-09-21 DIAGNOSIS — I350 Nonrheumatic aortic (valve) stenosis: Secondary | ICD-10-CM | POA: Diagnosis not present

## 2020-09-21 DIAGNOSIS — R7301 Impaired fasting glucose: Secondary | ICD-10-CM | POA: Diagnosis not present

## 2020-09-21 DIAGNOSIS — Z79899 Other long term (current) drug therapy: Secondary | ICD-10-CM | POA: Diagnosis not present

## 2020-09-21 DIAGNOSIS — R5383 Other fatigue: Secondary | ICD-10-CM | POA: Diagnosis not present

## 2020-09-21 DIAGNOSIS — M0579 Rheumatoid arthritis with rheumatoid factor of multiple sites without organ or systems involvement: Secondary | ICD-10-CM | POA: Diagnosis not present

## 2020-09-21 DIAGNOSIS — Z111 Encounter for screening for respiratory tuberculosis: Secondary | ICD-10-CM | POA: Diagnosis not present

## 2020-09-21 DIAGNOSIS — R030 Elevated blood-pressure reading, without diagnosis of hypertension: Secondary | ICD-10-CM | POA: Diagnosis not present

## 2020-10-20 DIAGNOSIS — M0579 Rheumatoid arthritis with rheumatoid factor of multiple sites without organ or systems involvement: Secondary | ICD-10-CM | POA: Diagnosis not present

## 2020-11-04 DIAGNOSIS — Z23 Encounter for immunization: Secondary | ICD-10-CM | POA: Diagnosis not present

## 2020-11-11 ENCOUNTER — Encounter: Payer: Self-pay | Admitting: Cardiovascular Disease

## 2020-11-11 ENCOUNTER — Other Ambulatory Visit: Payer: Self-pay

## 2020-11-11 ENCOUNTER — Ambulatory Visit (INDEPENDENT_AMBULATORY_CARE_PROVIDER_SITE_OTHER): Payer: Medicare Other | Admitting: Cardiovascular Disease

## 2020-11-11 VITALS — BP 147/85 | HR 74 | Ht 64.0 in | Wt 219.0 lb

## 2020-11-11 DIAGNOSIS — M069 Rheumatoid arthritis, unspecified: Secondary | ICD-10-CM

## 2020-11-11 DIAGNOSIS — E785 Hyperlipidemia, unspecified: Secondary | ICD-10-CM | POA: Diagnosis not present

## 2020-11-11 DIAGNOSIS — I5189 Other ill-defined heart diseases: Secondary | ICD-10-CM | POA: Diagnosis not present

## 2020-11-11 DIAGNOSIS — I350 Nonrheumatic aortic (valve) stenosis: Secondary | ICD-10-CM

## 2020-11-11 DIAGNOSIS — R931 Abnormal findings on diagnostic imaging of heart and coronary circulation: Secondary | ICD-10-CM | POA: Diagnosis not present

## 2020-11-11 NOTE — Patient Instructions (Signed)
Medication Instructions:  Your physician recommends that you continue on your current medications as directed. Please refer to the Current Medication list given to you today.   *If you need a refill on your cardiac medications before your next appointment, please call your pharmacy*  Testing/Procedures: Your physician has requested that you have an echocardiogram (August of 2023).Echocardiography is a painless test that uses sound waves to create images of your heart. It provides your doctor with information about the size and shape of your heart and how well your heart's chambers and valves are working. This procedure takes approximately one hour. There are no restrictions for this procedure.   Follow-Up: At [facility], you and your health needs are our priority.  As part of our continuing mission to provide you with exceptional heart care, we have created designated Provider Care Teams.  These Care Teams include your primary Cardiologist (physician) and Advanced Practice Providers (APPs -  Physician Assistants and Nurse Practitioners) who all work together to provide you with the care you need, when you need it.  We recommend signing up for the patient portal called "MyChart".  Sign up information is provided on this After Visit Summary.  MyChart is used to connect with patients for Virtual Visits (Telemedicine).  Patients are able to view lab/test results, encounter notes, upcoming appointments, etc.  Non-urgent messages can be sent to your provider as well.   To learn more about what you can do with MyChart, go to [email or web address].    Your next appointment:   August of 2023.   The format for your next appointment:   In Person  Provider:   Shelva Majestic, MD

## 2020-11-11 NOTE — Progress Notes (Signed)
Cardiology Office Note    Date:  11/14/2020   ID:  [name], DOB 28-Sep-1935, MRN [id number]  PCP:  Katherine Infante, MD  Cardiologist:  Katherine Majestic, MD    3 month F/U cardiology evaluation initially referred through the courtesy of Dr. Elta Guadeloupe Ellis for further evaluation of aortic stenosis, CAD, and hypertension.  History of Present Illness:  [name] is a 85 y.o. female who has been followed by Dr. Elta Guadeloupe Ellis for primary care.  She has a history of documented coronary calcification on CT cardiac scoring from January 24, 2018 which showed a total Agatston  score of 505 with extensive calcifications throughout the LAD.  Calcifications were also noted in the proximal circumflex and in the mid RCA.  There is also evidence for aortic atherosclerosis.  She has undergone prior 2D echo Doppler imaging in December 2019 which showed normal systolic function with EF at 60 to 65% without wall motion abnormalities.  Aortic valve was moderately calcified and trileaflet.  There was mild aortic stenosis with a mean gradient of 15 and peak gradient of 37 mmHg. I saw her for my initial evaluation with me on August 13, 2020. She denied any history of chest pain or exertional dyspnea.  There is no history of presyncope or syncope and she was unaware of any rhythm abnormalities.  She has a history of hyperlipidemia and recently her rosuvastatin dose was increased from 10 mg up to 20 mg after her LDL cholesterol was 85 in February 2022.  She had not had follow-up laboratory since medication increase.  During my evaluation, her blood pressure was stable at 124/82.  Her ECG showed sinus rhythm with first-degree AV block with nonspecific ST changes in lead aVL.  She did not have LVH or strain pattern on ECG.  Since I initially saw her, she underwent a follow-up echo Doppler study on September 08, 2020.  She was found to have normal to hyperdynamic LV function with EF 65 to 70% and grade 1 diastolic dysfunction.   She had moderate calcification of aortic valve and was felt now to have moderate aortic stenosis with a mean gradient of 27.7 mm and peak instantaneous gradient at 53.5 mm.  AVA was 1.31 cm.  There was mild dilation of her left atrium.  Presently, she feels well.  At times she notices trivial edema left ankle greater than right.  She uses a recumbent bike for exercise.  She walks with a walker.  She states her blood pressure at home has been stable in the 120s over 70s.  She denies chest pain, PND, orthopnea palpitations, presyncope or syncope.  She presents for reevaluation.  Past Medical History:  Diagnosis Date   Arthritis    rheumatoid   PONV (postoperative nausea and vomiting)    none recently   Shingles 04/21/14   seen by Dr Joylene Draft and treated with Valtrex    Past Surgical History:  Procedure Laterality Date   ABDOMINAL HYSTERECTOMY  1984   ovarys left in   BREAST BIOPSY Right 2018   CHOLECYSTECTOMY  2005   [place]   right knee   KNEE RECONSTRUCTION Left 09/26/12   left knee replacement  2012   x2 surgeries-ruptures patella tendon, fractured patella   PATELLAR TENDON REPAIR  07/21/2011   Procedure: PATELLA TENDON REPAIR;  Surgeon: Gearlean Alf, MD;  Location: [facility];  Service: Orthopedics;  Laterality: Left;  Left  Knee Patella Tendon Reconstruction with allograft   QUADRICEPS TENDON REPAIR Left 09/26/2012   Procedure: LEFT EXTENSOR MECHANISM RECONSTRUCTION/WITH GRAFT;  Surgeon: Gearlean Alf, MD;  Location: [facility];  Service: Orthopedics;  Laterality: Left;   REPAIR EXTENSOR TENDON Right 03/05/2015   Procedure: RECONSTRUCTION EXTENSOR HOOD RIGHT MIDDLE FINGER;  Surgeon: Daryll Brod, MD;  Location: [facility];  Service: Orthopedics;  Laterality: Right;  ANESTHESIA:  IV REGIONAL FAB   TONSILLECTOMY     as child   wrist bilateral  [phone]   for arthritis    Current Medications: Outpatient Medications Prior  to Visit  Medication Sig Dispense Refill   acetaminophen (TYLENOL) 500 MG tablet Take 1,000 mg by mouth every 6 (six) hours as needed. For headache     diphenhydrAMINE (SOMINEX) 25 MG tablet Take 25 mg by mouth as needed for allergies or sleep (take 12.5 to 25 mg as needed).     estrogens, conjugated, (PREMARIN) 0.3 MG tablet Take 0.3 mg by mouth daily with breakfast.      folic acid (FOLVITE) 1 MG tablet Take 1 mg by mouth daily with breakfast.     Methotrexate Sodium (METHOTREXATE PO) Take 2.5 mg by mouth. 3 tablets on Saturday and 3 tablets on Sunday     Multiple Vitamins-Minerals (PRESERVISION AREDS 2 PO) Take by mouth 2 (two) times daily.     OVER THE COUNTER MEDICATION Take 1 tablet by mouth 2 (two) times daily. Algae-Cal=746m Calcium, 659mMagnesium, and 1000 units of Vitamin D     oxymetazoline (AFRIN) 0.05 % nasal spray Place 2 sprays into the nose as needed. For congestion     Propylene Glycol (SYSTANE BALANCE OP) Apply 1 drop to eye as needed.     rosuvastatin (CRESTOR) 20 MG tablet Take 20 mg by mouth 3 (three) times a week.     tocilizumab (ACTEMRA) 400 MG/20ML SOLN injection Inject into the vein once.     traMADol (ULTRAM) 50 MG tablet Take 1-2 tablets (50-100 mg total) by mouth every 6 (six) hours as needed. 80 tablet 1   vitamin C (ASCORBIC ACID) 500 MG tablet Take 500 mg by mouth as needed.      VITAMIN D, ERGOCALCIFEROL, PO Take 10,000 Units by mouth 3 (three) times a week. On Wednesdays     naphazoline-glycerin (CLEAR EYES) 0.012-0.2 % SOLN Place 1-2 drops into both eyes every 4 (four) hours as needed (dry eyes).     No facility-administered medications prior to visit.     Allergies:   Morphine and related and Sulfa antibiotics   Social History   Socioeconomic History   Marital status: Divorced    Spouse name: Not on file   Number of children: Not on file   Years of education: Not on file   Highest education level: Not on file  Occupational History   Not on file   Tobacco Use   Smoking status: Former    Packs/day: 0.50    Years: 15.00    Pack years: 7.50    Types: Cigarettes    Quit date: 02/07/2005    Years since quitting: 15.7   Smokeless tobacco: Never  Substance and Sexual Activity   Alcohol use: No   Drug use: No   Sexual activity: Not on file  Other Topics Concern   Not on file  Social History Narrative   Not on file   Social Determinants of Health   Financial Resource Strain: Not on file  Food Insecurity: Not  on file  Transportation Needs: Not on file  Physical Activity: Not on file  Stress: Not on file  Social Connections: Not on file    Additional social history is notable that she was born in [place] and grew up in [place], [place].  She has been divorced since 1984.  She has 3 children and 6 grandchildren.  She lives by herself.  She walks with a walker.  Previously she was a Music therapist at [employer].  She attended college at [employer] today.  There is remote tobacco history but she quit smoking in 2000.  She exercises on a recumbent bike 2-3 times per week for approximately 20 minutes.  Family History: Her mother died at age 4 and had throat cancer.  Her father died at age 36 and had lung cancer.  She has a sister age 38.  She has 3 sons ages 49, 63, and 69.  ROS General: Negative; No fevers, chills, or night sweats;  HEENT: Negative; No changes in vision or hearing, sinus congestion, difficulty swallowing Pulmonary: Negative; No cough, wheezing, shortness of breath, hemoptysis Cardiovascular: See HPI GI: Negative; No nausea, vomiting, diarrhea, or abdominal pain GU: Negative; No dysuria, hematuria, or difficulty voiding Musculoskeletal: In 2011 she broke her left kneecap.  She has undergone 6 knee surgeries in 4 years.  She has a history of rheumatoid arthritis and had seen Dr. Charlestine Night and most recently sees Dr. Amil Amen Hematologic/Oncology: Negative; no easy bruising, bleeding Endocrine: Negative; no  heat/cold intolerance; no diabetes Neuro: Negative; no changes in balance, headaches Skin: Negative; No rashes or skin lesions Psychiatric: Negative; No behavioral problems, depression Sleep: Negative; No snoring, daytime sleepiness, hypersomnolence, bruxism, restless legs, hypnogognic hallucinations, no cataplexy Other comprehensive 14 point system review is negative.   PHYSICAL EXAM:   VS:  BP (!) 147/85   Pulse 74   Ht 5' 4"  (1.626 m)   Wt 219 lb (99.3 kg)   SpO2 96%   BMI 37.59 kg/m     Repeat blood pressure by me was 134/80.  Wt Readings from Last 3 Encounters:  11/11/20 219 lb (99.3 kg)  08/13/20 217 lb (98.4 kg)  06/01/16 220 lb 6.4 oz (100 kg)    General: Alert, oriented, no distress.  Skin: normal turgor, no rashes, warm and dry HEENT: Normocephalic, atraumatic. Pupils equal round and reactive to light; sclera anicteric; extraocular muscles intact;  Nose without nasal septal hypertrophy Mouth/Parynx benign; Mallinpatti scale 3 Neck: No JVD, no carotid bruits; normal carotid upstroke Lungs: clear to ausculatation and percussion; no wheezing or rales Chest wall: without tenderness to palpitation Heart: PMI not displaced, RRR, s1 s2 normal, 2-3/6  harsh mid peaking systolic murmur, no diastolic murmur, no rubs, gallops, thrills, or heaves Abdomen: soft, nontender; no hepatosplenomehaly, BS+; abdominal aorta nontender and not dilated by palpation. Back: no CVA tenderness Pulses 2+ Musculoskeletal: full range of motion, normal strength, no joint deformities Extremities: no clubbing cyanosis or edema, Homan's sign negative  Neurologic: grossly nonfocal; Cranial nerves grossly wnl Psychologic: Normal mood and affect    Studies/Labs Reviewed:   November 11, 2020 ECG (independently read by me): NSR at 74; 1st degree AV block, PR 306 msec, QS V1-3, ST abnormality I, aVL.  August 13, 2020 ECG (independently read by me): Normal sinus rhythm at 80 bpm, nonspecific ST T wave  abnormality in lead aVL.  First-degree AV block with appearing to 80 ms.  No ectopy.  Recent Labs: BMP Latest Ref Rng & Units 09/15/2020 02/10/2016 07/22/2011  Glucose 65 - 99 mg/dL 94 115(H) 192(H)  BUN 8 - 27 mg/dL 11 11 6   Creatinine 0.57 - 1.00 mg/dL 0.48(L) 0.44 0.37(L)  BUN/Creat Ratio 12 - 28 23 - -  Sodium 134 - 144 mmol/L 140 137 137  Potassium 3.5 - 5.2 mmol/L 4.5 4.0 4.1  Chloride 96 - 106 mmol/L 100 102 104  CO2 20 - 29 mmol/L 29 28 25   Calcium 8.7 - 10.3 mg/dL 8.8 8.7(L) 8.7     Hepatic Function Latest Ref Rng & Units 09/15/2020 02/10/2016 07/15/2011  Total Protein 6.0 - 8.5 g/dL 6.2 6.4(L) 7.0  Albumin 3.6 - 4.6 g/dL 4.3 3.9 3.5  AST 0 - 40 IU/L 17 16 10   ALT 0 - 32 IU/L 10 12(L) 10  Alk Phosphatase 44 - 121 IU/L 44 46 55  Total Bilirubin 0.0 - 1.2 mg/dL 0.8 0.7 0.4    CBC Latest Ref Rng & Units 02/10/2016 09/19/2012 07/23/2011  WBC 4.0 - 10.5 K/uL 8.3 7.6 10.4  Hemoglobin 12.0 - 15.0 g/dL 13.0 13.1 11.7(L)  Hematocrit 36.0 - 46.0 % 39.5 39.8 35.2(L)  Platelets 150 - 400 K/uL 207 281 327   Lab Results  Component Value Date   MCV 99.0 02/10/2016   MCV 101.5 (H) 09/19/2012   MCV 101.1 (H) 07/23/2011   No results found for: TSH No results found for: HGBA1C   BNP No results found for: BNP  ProBNP No results found for: PROBNP   Lipid Panel     Component Value Date/Time   CHOL 160 09/15/2020 1041   TRIG 169 (H) 09/15/2020 1041   HDL 59 09/15/2020 1041   CHOLHDL 2.7 09/15/2020 1041   LDLCALC 73 09/15/2020 1041   LABVLDL 28 09/15/2020 1041     RADIOLOGY: No results found.   Additional studies/ records that were reviewed today include:   I reviewed the comprehensive records of Dr. Elta Guadeloupe Ellis.  ECHO 01/28/2018 Study Conclusions  - Left ventricle: The cavity size was normal. Systolic function was    normal. The estimated ejection fraction was in the range of 60%    to 65%. Wall motion was normal; there were no regional wall    motion abnormalities. The  study is indeterminate for the grading    of LV diastolic function.  - Aortic valve: Moderately calcified annulus. Probably trileaflet;    moderately thickened, moderately calcified leaflets. Right    coronary cusp mobility was moderately restricted. There was mild    stenosis.  - Mitral valve: Calcified annulus. There was trivial regurgitation.  - Right ventricle: Systolic function was normal.  - Atrial septum: No defect or patent foramen ovale was identified.  - Tricuspid valve: There was no significant regurgitation.   Impressions:   - Normal LV EF. Thickened, calcified aortic valve and ascending    aorta with mild stenosis.    CT HEART: 01/24/2018 CORONARY CALCIUM   Total Agatston Score: 505 with extensive calcifications throughout the left anterior descending coronary artery. Calcifications also noted in the proximal left circumflex and in the mid RCA   MESA database percentile:  81   OTHER FINDINGS:   Cardiovascular: Heart is normal size. Aorta is normal caliber. Scattered aortic calcifications.   Mediastinum/Nodes: No adenopathy in the lower mediastinum or hila.   Lungs/Pleura: Peripheral interstitial prominence compatible with scarring in the lung bases. No effusions or confluent opacities.   Upper Abdomen: Imaging into the upper abdomen shows no acute findings.   Musculoskeletal: Chest wall soft tissues are  unremarkable. No acute bony abnormality.   IMPRESSION: The observed calcium score of 505 is at the percentile 81 for subjects of the same age, gender and race/ethnicity who are free of clinical cardiovascular disease and treated diabetes.   No acute extra cardiac abnormality.   Aortic atherosclerosis.   ECHO 09/08/2020   IMPRESSIONS   1. Left ventricular ejection fraction, by estimation, is 65 to 70%. The  left ventricle has normal function. The left ventricle has no regional  wall motion abnormalities. Left ventricular diastolic parameters are   consistent with Grade I diastolic  dysfunction (impaired relaxation).   2. Right ventricular systolic function is normal. The right ventricular  size is normal.   3. Left atrial size was mildly dilated.   4. The mitral valve is normal in structure. Trivial mitral valve  regurgitation. No evidence of mitral stenosis.   5. The aortic valve has an indeterminant number of cusps. There is  moderate calcification of the aortic valve. Aortic valve regurgitation is  not visualized. Moderate aortic valve stenosis. Aortic valve area, by VTI  measures 1.31 cm. Aortic valve mean  gradient measures 27.7 mmHg. Aortic valve Vmax measures 3.66 m/s.   6. The inferior vena cava is normal in size with greater than 50%  respiratory variability, suggesting right atrial pressure of 3 mmHg.   Comparison(s): 01/17/18 EF 60-65%. Mild AS 47mHg mean PG, 351mg peak PG.   ASSESSMENT:    1. Nonrheumatic aortic valve stenosis   2. Elevated coronary artery calcium score   3. Hyperlipidemia with target LDL less than 70   4. Rheumatoid arthritis involving multiple sites, unspecified whether rheumatoid factor present ([place]  5. Grade I diastolic dysfunction      PLAN:  Mrs. SaOla Raaps an [age]ear old female patient who is a former patient of Dr. AnSharene Butterss well as Dr. GeElecta Sniff She subsequently has been followed by Dr. MaCrist Infanteor many years.  She has been documented to have an elevated coronary calcium score of 505 agatston units on CT imaging in December 2019 with extensive calcification throughout the LAD and also calcification present in the proximal left circumflex and in the mid RCA.  She has a history of hyperlipidemia, and most recent lipid studies in February 2022 showed total cholesterol 179, HDL 60, triglycerides 171, and LDL 85.  At the time she was on rosuvastatin 10 mg which was increased to 20 mg.  When I saw her for my initial evaluation, she had a 2-3 over 6 harsh systolic murmur  suggestive of probable moderate aortic stenosis with transmission to her carotids.  An echo Doppler study in 2019 had shown mild AAS with a mean gradient of 15 and peak instantaneous gradient of 37 mm.  When I initially saw her I recommended she undergo a follow-up echo Doppler study.  This now shows progression of her aortic valve disease to now moderate aortic stenosis.  She continues to have hyperdynamic LV function with EF at 65 to 70% without significant left ventricular hypertrophy or wall motion abnormalities.  She does have grade 1 diastolic dysfunction.  On her most recent echo, her mean gradient is now 27 with a peak instantaneous gradient at 53 mm and estimated aortic valve area is 1.31 cm consistent with moderate aortic stenosis.  Clinically she remains asymptomatic and specifically denies any chest tightness, presyncope or syncope, exertional dyspnea, paroxysmal nocturnal dyspnea, or orthopnea.  Her blood pressure today was slightly increased in the office but she states  her blood pressure at home typically is around 124/75.  She is on rosuvastatin 20 mg for hyperlipidemia.  She is on Tocilizumab for rheumatoid arthritis.  She has previously documented coronary calcification on prior CT imaging in December 2019 which showed a calcium score 505.  Target LDL is less than 70.  She will be following up with Dr. Abner Greenspan in 5 to 6 months and I have recommended that I see her back approximately 5 to 6 months later.  In the interim she will have a 1 year follow-up echo Doppler study in August 2023 for follow-up of her aortic stenosis.  She was advised again on symptoms referable to aortic stenosis and that she should see me sooner if there is a change in symptomatology.   Medication Adjustments/Labs and Tests Ordered: Current medicines are reviewed at length with the patient today.  Concerns regarding medicines are outlined above.  Medication changes, Labs and Tests ordered today are listed in the Patient  Instructions below. Patient Instructions  Medication Instructions:  Your physician recommends that you continue on your current medications as directed. Please refer to the Current Medication list given to you today.   *If you need a refill on your cardiac medications before your next appointment, please call your pharmacy*  Testing/Procedures: Your physician has requested that you have an echocardiogram (August of 2023).Echocardiography is a painless test that uses sound waves to create images of your heart. It provides your doctor with information about the size and shape of your heart and how well your heart's chambers and valves are working. This procedure takes approximately one hour. There are no restrictions for this procedure.   Follow-Up: At [facility], you and your health needs are our priority.  As part of our continuing mission to provide you with exceptional heart care, we have created designated Provider Care Teams.  These Care Teams include your primary Cardiologist (physician) and Advanced Practice Providers (APPs -  Physician Assistants and Nurse Practitioners) who all work together to provide you with the care you need, when you need it.  We recommend signing up for the patient portal called "MyChart".  Sign up information is provided on this After Visit Summary.  MyChart is used to connect with patients for Virtual Visits (Telemedicine).  Patients are able to view lab/test results, encounter notes, upcoming appointments, etc.  Non-urgent messages can be sent to your provider as well.   To learn more about what you can do with MyChart, go to [email or web address].    Your next appointment:   August of 2023.   The format for your next appointment:   In Person  Provider:   Shelva Majestic, MD    Signed, Katherine Majestic, MD  11/14/2020 12:19 PM    [facility] [address] [place], [place], [place]  [address] Phone: [phone]

## 2020-11-14 ENCOUNTER — Encounter: Payer: Self-pay | Admitting: Cardiovascular Disease

## 2020-11-18 DIAGNOSIS — M0579 Rheumatoid arthritis with rheumatoid factor of multiple sites without organ or systems involvement: Secondary | ICD-10-CM | POA: Diagnosis not present

## 2020-11-20 DIAGNOSIS — Z96652 Presence of left artificial knee joint: Secondary | ICD-10-CM | POA: Diagnosis not present

## 2020-11-28 DIAGNOSIS — Z23 Encounter for immunization: Secondary | ICD-10-CM | POA: Diagnosis not present

## 2020-12-22 DIAGNOSIS — Z79899 Other long term (current) drug therapy: Secondary | ICD-10-CM | POA: Diagnosis not present

## 2020-12-22 DIAGNOSIS — M0579 Rheumatoid arthritis with rheumatoid factor of multiple sites without organ or systems involvement: Secondary | ICD-10-CM | POA: Diagnosis not present

## 2021-01-06 DIAGNOSIS — L578 Other skin changes due to chronic exposure to nonionizing radiation: Secondary | ICD-10-CM | POA: Diagnosis not present

## 2021-01-06 DIAGNOSIS — L820 Inflamed seborrheic keratosis: Secondary | ICD-10-CM | POA: Diagnosis not present

## 2021-01-06 DIAGNOSIS — Z85828 Personal history of other malignant neoplasm of skin: Secondary | ICD-10-CM | POA: Diagnosis not present

## 2021-01-06 DIAGNOSIS — L708 Other acne: Secondary | ICD-10-CM | POA: Diagnosis not present

## 2021-01-06 DIAGNOSIS — L821 Other seborrheic keratosis: Secondary | ICD-10-CM | POA: Diagnosis not present

## 2021-01-19 DIAGNOSIS — M810 Age-related osteoporosis without current pathological fracture: Secondary | ICD-10-CM | POA: Diagnosis not present

## 2021-01-19 DIAGNOSIS — Z79899 Other long term (current) drug therapy: Secondary | ICD-10-CM | POA: Diagnosis not present

## 2021-01-19 DIAGNOSIS — M0579 Rheumatoid arthritis with rheumatoid factor of multiple sites without organ or systems involvement: Secondary | ICD-10-CM | POA: Diagnosis not present

## 2021-01-19 DIAGNOSIS — Z6837 Body mass index (BMI) 37.0-37.9, adult: Secondary | ICD-10-CM | POA: Diagnosis not present

## 2021-01-19 DIAGNOSIS — M5431 Sciatica, right side: Secondary | ICD-10-CM | POA: Diagnosis not present

## 2021-01-19 DIAGNOSIS — E669 Obesity, unspecified: Secondary | ICD-10-CM | POA: Diagnosis not present

## 2021-01-20 DIAGNOSIS — M0579 Rheumatoid arthritis with rheumatoid factor of multiple sites without organ or systems involvement: Secondary | ICD-10-CM | POA: Diagnosis not present

## 2021-02-24 DIAGNOSIS — M0579 Rheumatoid arthritis with rheumatoid factor of multiple sites without organ or systems involvement: Secondary | ICD-10-CM | POA: Diagnosis not present

## 2021-03-24 DIAGNOSIS — M0579 Rheumatoid arthritis with rheumatoid factor of multiple sites without organ or systems involvement: Secondary | ICD-10-CM | POA: Diagnosis not present

## 2021-03-24 DIAGNOSIS — Z79899 Other long term (current) drug therapy: Secondary | ICD-10-CM | POA: Diagnosis not present

## 2021-04-05 DIAGNOSIS — I2510 Atherosclerotic heart disease of native coronary artery without angina pectoris: Secondary | ICD-10-CM | POA: Diagnosis not present

## 2021-04-05 DIAGNOSIS — Z1339 Encounter for screening examination for other mental health and behavioral disorders: Secondary | ICD-10-CM | POA: Diagnosis not present

## 2021-04-05 DIAGNOSIS — M069 Rheumatoid arthritis, unspecified: Secondary | ICD-10-CM | POA: Diagnosis not present

## 2021-04-05 DIAGNOSIS — Z0000 Encounter for general adult medical examination without abnormal findings: Secondary | ICD-10-CM | POA: Diagnosis not present

## 2021-04-05 DIAGNOSIS — E669 Obesity, unspecified: Secondary | ICD-10-CM | POA: Diagnosis not present

## 2021-04-05 DIAGNOSIS — E785 Hyperlipidemia, unspecified: Secondary | ICD-10-CM | POA: Diagnosis not present

## 2021-04-05 DIAGNOSIS — J40 Bronchitis, not specified as acute or chronic: Secondary | ICD-10-CM | POA: Diagnosis not present

## 2021-04-05 DIAGNOSIS — I350 Nonrheumatic aortic (valve) stenosis: Secondary | ICD-10-CM | POA: Diagnosis not present

## 2021-04-05 DIAGNOSIS — Z1331 Encounter for screening for depression: Secondary | ICD-10-CM | POA: Diagnosis not present

## 2021-04-23 DIAGNOSIS — Z96652 Presence of left artificial knee joint: Secondary | ICD-10-CM | POA: Diagnosis not present

## 2021-04-26 DIAGNOSIS — M0579 Rheumatoid arthritis with rheumatoid factor of multiple sites without organ or systems involvement: Secondary | ICD-10-CM | POA: Diagnosis not present

## 2021-05-26 DIAGNOSIS — M0579 Rheumatoid arthritis with rheumatoid factor of multiple sites without organ or systems involvement: Secondary | ICD-10-CM | POA: Diagnosis not present

## 2021-06-01 DIAGNOSIS — R82998 Other abnormal findings in urine: Secondary | ICD-10-CM | POA: Diagnosis not present

## 2021-06-01 DIAGNOSIS — E538 Deficiency of other specified B group vitamins: Secondary | ICD-10-CM | POA: Diagnosis not present

## 2021-06-01 DIAGNOSIS — I2510 Atherosclerotic heart disease of native coronary artery without angina pectoris: Secondary | ICD-10-CM | POA: Diagnosis not present

## 2021-06-01 DIAGNOSIS — E785 Hyperlipidemia, unspecified: Secondary | ICD-10-CM | POA: Diagnosis not present

## 2021-06-01 DIAGNOSIS — M810 Age-related osteoporosis without current pathological fracture: Secondary | ICD-10-CM | POA: Diagnosis not present

## 2021-06-01 DIAGNOSIS — Z23 Encounter for immunization: Secondary | ICD-10-CM | POA: Diagnosis not present

## 2021-06-06 DIAGNOSIS — M810 Age-related osteoporosis without current pathological fracture: Secondary | ICD-10-CM | POA: Diagnosis not present

## 2021-06-06 DIAGNOSIS — E785 Hyperlipidemia, unspecified: Secondary | ICD-10-CM | POA: Diagnosis not present

## 2021-06-06 DIAGNOSIS — I2510 Atherosclerotic heart disease of native coronary artery without angina pectoris: Secondary | ICD-10-CM | POA: Diagnosis not present

## 2021-06-29 DIAGNOSIS — Z79899 Other long term (current) drug therapy: Secondary | ICD-10-CM | POA: Diagnosis not present

## 2021-06-29 DIAGNOSIS — M0579 Rheumatoid arthritis with rheumatoid factor of multiple sites without organ or systems involvement: Secondary | ICD-10-CM | POA: Diagnosis not present

## 2021-07-06 DIAGNOSIS — Z23 Encounter for immunization: Secondary | ICD-10-CM | POA: Diagnosis not present

## 2021-07-13 DIAGNOSIS — H04123 Dry eye syndrome of bilateral lacrimal glands: Secondary | ICD-10-CM | POA: Diagnosis not present

## 2021-07-13 DIAGNOSIS — H26491 Other secondary cataract, right eye: Secondary | ICD-10-CM | POA: Diagnosis not present

## 2021-07-20 DIAGNOSIS — E669 Obesity, unspecified: Secondary | ICD-10-CM | POA: Diagnosis not present

## 2021-07-20 DIAGNOSIS — M810 Age-related osteoporosis without current pathological fracture: Secondary | ICD-10-CM | POA: Diagnosis not present

## 2021-07-20 DIAGNOSIS — M5431 Sciatica, right side: Secondary | ICD-10-CM | POA: Diagnosis not present

## 2021-07-20 DIAGNOSIS — Z79899 Other long term (current) drug therapy: Secondary | ICD-10-CM | POA: Diagnosis not present

## 2021-07-20 DIAGNOSIS — M0579 Rheumatoid arthritis with rheumatoid factor of multiple sites without organ or systems involvement: Secondary | ICD-10-CM | POA: Diagnosis not present

## 2021-07-20 DIAGNOSIS — Z6838 Body mass index (BMI) 38.0-38.9, adult: Secondary | ICD-10-CM | POA: Diagnosis not present

## 2021-07-27 DIAGNOSIS — M0579 Rheumatoid arthritis with rheumatoid factor of multiple sites without organ or systems involvement: Secondary | ICD-10-CM | POA: Diagnosis not present

## 2021-08-05 ENCOUNTER — Other Ambulatory Visit: Payer: Self-pay | Admitting: Internal Medicine

## 2021-08-05 DIAGNOSIS — Z1231 Encounter for screening mammogram for malignant neoplasm of breast: Secondary | ICD-10-CM

## 2021-08-16 DIAGNOSIS — H26491 Other secondary cataract, right eye: Secondary | ICD-10-CM | POA: Diagnosis not present

## 2021-08-23 ENCOUNTER — Ambulatory Visit: Payer: Medicare Other

## 2021-08-24 DIAGNOSIS — M0579 Rheumatoid arthritis with rheumatoid factor of multiple sites without organ or systems involvement: Secondary | ICD-10-CM | POA: Diagnosis not present

## 2021-08-31 ENCOUNTER — Ambulatory Visit
Admission: RE | Admit: 2021-08-31 | Discharge: 2021-08-31 | Disposition: A | Discharge status: Home / Self Care | Payer: Medicare Other | Source: Ambulatory Visit | Attending: Internal Medicine | Admitting: Internal Medicine

## 2021-08-31 DIAGNOSIS — Z1231 Encounter for screening mammogram for malignant neoplasm of breast: Secondary | ICD-10-CM

## 2021-09-07 ENCOUNTER — Ambulatory Visit (HOSPITAL_COMMUNITY): Payer: Medicare Other | Attending: Cardiovascular Disease

## 2021-09-07 DIAGNOSIS — I350 Nonrheumatic aortic (valve) stenosis: Secondary | ICD-10-CM | POA: Diagnosis not present

## 2021-09-07 LAB — ECHOCARDIOGRAM COMPLETE
AR max vel: 0.99 cm2
AV Area VTI: 0.99 cm2
AV Area mean vel: 1.06 cm2
AV Mean grad: 32 mmHg
AV Peak grad: 53 mmHg
Ao pk vel: 3.64 m/s
Area-P 1/2: 2.91 cm2
S' Lateral: 2.5 cm

## 2021-09-21 DIAGNOSIS — Z79899 Other long term (current) drug therapy: Secondary | ICD-10-CM | POA: Diagnosis not present

## 2021-09-21 DIAGNOSIS — M0579 Rheumatoid arthritis with rheumatoid factor of multiple sites without organ or systems involvement: Secondary | ICD-10-CM | POA: Diagnosis not present

## 2021-10-19 DIAGNOSIS — R5383 Other fatigue: Secondary | ICD-10-CM | POA: Diagnosis not present

## 2021-10-19 DIAGNOSIS — Z111 Encounter for screening for respiratory tuberculosis: Secondary | ICD-10-CM | POA: Diagnosis not present

## 2021-10-19 DIAGNOSIS — Z79899 Other long term (current) drug therapy: Secondary | ICD-10-CM | POA: Diagnosis not present

## 2021-10-19 DIAGNOSIS — M0579 Rheumatoid arthritis with rheumatoid factor of multiple sites without organ or systems involvement: Secondary | ICD-10-CM | POA: Diagnosis not present

## 2021-11-03 DIAGNOSIS — Z96653 Presence of artificial knee joint, bilateral: Secondary | ICD-10-CM | POA: Diagnosis not present

## 2021-11-03 DIAGNOSIS — Z96651 Presence of right artificial knee joint: Secondary | ICD-10-CM | POA: Diagnosis not present

## 2021-11-05 DIAGNOSIS — E785 Hyperlipidemia, unspecified: Secondary | ICD-10-CM | POA: Diagnosis not present

## 2021-11-05 DIAGNOSIS — J309 Allergic rhinitis, unspecified: Secondary | ICD-10-CM | POA: Diagnosis not present

## 2021-11-05 DIAGNOSIS — E538 Deficiency of other specified B group vitamins: Secondary | ICD-10-CM | POA: Diagnosis not present

## 2021-11-05 DIAGNOSIS — I2510 Atherosclerotic heart disease of native coronary artery without angina pectoris: Secondary | ICD-10-CM | POA: Diagnosis not present

## 2021-11-05 DIAGNOSIS — K219 Gastro-esophageal reflux disease without esophagitis: Secondary | ICD-10-CM | POA: Diagnosis not present

## 2021-11-05 DIAGNOSIS — R7301 Impaired fasting glucose: Secondary | ICD-10-CM | POA: Diagnosis not present

## 2021-11-05 DIAGNOSIS — M810 Age-related osteoporosis without current pathological fracture: Secondary | ICD-10-CM | POA: Diagnosis not present

## 2021-11-05 DIAGNOSIS — Z23 Encounter for immunization: Secondary | ICD-10-CM | POA: Diagnosis not present

## 2021-11-05 DIAGNOSIS — E669 Obesity, unspecified: Secondary | ICD-10-CM | POA: Diagnosis not present

## 2021-11-16 DIAGNOSIS — M0579 Rheumatoid arthritis with rheumatoid factor of multiple sites without organ or systems involvement: Secondary | ICD-10-CM | POA: Diagnosis not present

## 2021-11-23 DIAGNOSIS — Z23 Encounter for immunization: Secondary | ICD-10-CM | POA: Diagnosis not present

## 2021-12-14 DIAGNOSIS — Z79899 Other long term (current) drug therapy: Secondary | ICD-10-CM | POA: Diagnosis not present

## 2021-12-14 DIAGNOSIS — M0579 Rheumatoid arthritis with rheumatoid factor of multiple sites without organ or systems involvement: Secondary | ICD-10-CM | POA: Diagnosis not present

## 2022-01-11 DIAGNOSIS — M0579 Rheumatoid arthritis with rheumatoid factor of multiple sites without organ or systems involvement: Secondary | ICD-10-CM | POA: Diagnosis not present

## 2022-01-13 DIAGNOSIS — Z85828 Personal history of other malignant neoplasm of skin: Secondary | ICD-10-CM | POA: Diagnosis not present

## 2022-01-13 DIAGNOSIS — L578 Other skin changes due to chronic exposure to nonionizing radiation: Secondary | ICD-10-CM | POA: Diagnosis not present

## 2022-01-13 DIAGNOSIS — L299 Pruritus, unspecified: Secondary | ICD-10-CM | POA: Diagnosis not present

## 2022-01-13 DIAGNOSIS — L821 Other seborrheic keratosis: Secondary | ICD-10-CM | POA: Diagnosis not present

## 2022-01-13 DIAGNOSIS — L570 Actinic keratosis: Secondary | ICD-10-CM | POA: Diagnosis not present

## 2022-01-25 DIAGNOSIS — M1991 Primary osteoarthritis, unspecified site: Secondary | ICD-10-CM | POA: Diagnosis not present

## 2022-01-25 DIAGNOSIS — M0579 Rheumatoid arthritis with rheumatoid factor of multiple sites without organ or systems involvement: Secondary | ICD-10-CM | POA: Diagnosis not present

## 2022-01-25 DIAGNOSIS — E669 Obesity, unspecified: Secondary | ICD-10-CM | POA: Diagnosis not present

## 2022-01-25 DIAGNOSIS — Z6836 Body mass index (BMI) 36.0-36.9, adult: Secondary | ICD-10-CM | POA: Diagnosis not present

## 2022-01-25 DIAGNOSIS — Z79899 Other long term (current) drug therapy: Secondary | ICD-10-CM | POA: Diagnosis not present

## 2022-01-25 DIAGNOSIS — M5431 Sciatica, right side: Secondary | ICD-10-CM | POA: Diagnosis not present

## 2022-01-25 DIAGNOSIS — M810 Age-related osteoporosis without current pathological fracture: Secondary | ICD-10-CM | POA: Diagnosis not present

## 2022-02-08 DIAGNOSIS — M0579 Rheumatoid arthritis with rheumatoid factor of multiple sites without organ or systems involvement: Secondary | ICD-10-CM | POA: Diagnosis not present

## 2022-03-02 ENCOUNTER — Ambulatory Visit: Payer: Medicare Other | Attending: Cardiovascular Disease | Admitting: Cardiovascular Disease

## 2022-03-02 ENCOUNTER — Encounter: Payer: Self-pay | Admitting: Cardiovascular Disease

## 2022-03-02 VITALS — BP 128/82 | HR 69 | Ht 63.0 in | Wt 209.6 lb

## 2022-03-02 DIAGNOSIS — E785 Hyperlipidemia, unspecified: Secondary | ICD-10-CM | POA: Diagnosis not present

## 2022-03-02 DIAGNOSIS — M069 Rheumatoid arthritis, unspecified: Secondary | ICD-10-CM | POA: Diagnosis not present

## 2022-03-02 DIAGNOSIS — I700 Atherosclerosis of aorta: Secondary | ICD-10-CM | POA: Diagnosis not present

## 2022-03-02 DIAGNOSIS — I350 Nonrheumatic aortic (valve) stenosis: Secondary | ICD-10-CM | POA: Diagnosis not present

## 2022-03-02 DIAGNOSIS — R931 Abnormal findings on diagnostic imaging of heart and coronary circulation: Secondary | ICD-10-CM | POA: Diagnosis not present

## 2022-03-02 NOTE — Progress Notes (Signed)
Cardiology Office Note    Date:  03/04/2022   ID:  [name], DOB 1935/12/31, MRN [id number]  PCP:  Crist Infante, MD  Cardiologist:  Shelva Majestic, MD    15  month F/U cardiology evaluation initially referred through the courtesy of Dr. Elta Guadeloupe Perrini for further evaluation of aortic stenosis, CAD, and hypertension.  History of Present Illness:  [name] is a 87 y.o. female who has been followed by Dr. Elta Guadeloupe Perrini for primary care.  She has a history of documented coronary calcification on CT cardiac scoring from January 24, 2018 which showed a total Agatston  score of 505 with extensive calcifications throughout the LAD.  Calcifications were also noted in the proximal circumflex and in the mid RCA.  There is also evidence for aortic atherosclerosis.  She has undergone prior 2D echo Doppler imaging in December 2019 which showed normal systolic function with EF at 60 to 65% without wall motion abnormalities.  Aortic valve was moderately calcified and trileaflet.  There was mild aortic stenosis with a mean gradient of 15 and peak gradient of 37 mmHg. I saw her for my initial evaluation with me on August 13, 2020. She denied any history of chest pain or exertional dyspnea.  There is no history of presyncope or syncope and she was unaware of any rhythm abnormalities.  She has a history of hyperlipidemia and recently her rosuvastatin dose was increased from 10 mg up to 20 mg after her LDL cholesterol was 85 in February 2022.  She had not had follow-up laboratory since medication increase.  During my evaluation, her blood pressure was stable at 124/82.  Her ECG showed sinus rhythm with first-degree AV block with nonspecific ST changes in lead aVL.  She did not have LVH or strain pattern on ECG.  She underwent a follow-up echo Doppler study on September 08, 2020.  She was found to have normal to hyperdynamic LV function with EF 65 to 70% and grade 1 diastolic dysfunction.  She had moderate  calcification of aortic valve and was felt now to have moderate aortic stenosis with a mean gradient of 27.7 mm and peak instantaneous gradient at 53.5 mm.  AVA was 1.31 cm.  There was mild dilation of her left atrium.  I last saw her on November 11, 2020.  She felt well, but at times she had noticed some trivial edema left ankle greater than right.  She uses a recumbent bike for exercise.  She walks with a walker.  She states her blood pressure at home has been stable in the 120s over 70s.  She denies chest pain, PND, orthopnea palpitations, presyncope or syncope.    Since I last saw her, she underwent a follow-up echo Doppler study on September 07, 2021 to reassess her aortic stenosis.  LV function remains normal with EF 55 to 60%.  There were no wall motion abnormalities.  There was grade 1 diastolic dysfunction.  Aortic valve was moderately stenotic and her mean gradient was now 32 mmHg with a peak gradient of 53.  Estimated aortic valve area is 1.0 cm.  There was very mild dilation of her aortic root at 38 mm.  Presently, [name] continues to be asymptomatic.  She denies chest pain, shortness of breath, presyncope or syncope.  She walks with a walker.  She continues to see Dr. Amil Amen for her rheumatoid arthritis and is on monthly infusions.  She sees Dr. Joylene Draft several times per  year.  She is status post bilateral knee replacements and uses a left knee brace.  She presents for follow-up evaluation.  Past Medical History:  Diagnosis Date   Arthritis    rheumatoid   PONV (postoperative nausea and vomiting)    none recently   Shingles 04/21/14   seen by Dr Joylene Draft and treated with Valtrex    Past Surgical History:  Procedure Laterality Date   ABDOMINAL HYSTERECTOMY  1984   ovarys left in   BREAST BIOPSY Right 2018   CHOLECYSTECTOMY  2005   [place]   right knee   KNEE RECONSTRUCTION Left 09/26/12   left knee replacement  2012   x2  surgeries-ruptures patella tendon, fractured patella   PATELLAR TENDON REPAIR  07/21/2011   Procedure: PATELLA TENDON REPAIR;  Surgeon: Gearlean Alf, MD;  Location: [facility];  Service: Orthopedics;  Laterality: Left;  Left Knee Patella Tendon Reconstruction with allograft   QUADRICEPS TENDON REPAIR Left 09/26/2012   Procedure: LEFT EXTENSOR MECHANISM RECONSTRUCTION/WITH GRAFT;  Surgeon: Gearlean Alf, MD;  Location: [facility];  Service: Orthopedics;  Laterality: Left;   REPAIR EXTENSOR TENDON Right 03/05/2015   Procedure: RECONSTRUCTION EXTENSOR HOOD RIGHT MIDDLE FINGER;  Surgeon: Daryll Brod, MD;  Location: [facility];  Service: Orthopedics;  Laterality: Right;  ANESTHESIA:  IV REGIONAL FAB   TONSILLECTOMY     as child   wrist bilateral  [id number]   for arthritis    Current Medications: Outpatient Medications Prior to Visit  Medication Sig Dispense Refill   acetaminophen (TYLENOL) 500 MG tablet Take 1,000 mg by mouth every 6 (six) hours as needed. For headache     diphenhydrAMINE (BENADRYL ALLERGY) 25 MG tablet Take 1/2 tablet po 2x a day Oral     estrogens, conjugated, (PREMARIN) 0.3 MG tablet Take 0.3 mg by mouth daily with breakfast.      folic acid (FOLVITE) 1 MG tablet Take 1 mg by mouth daily with breakfast.     Methotrexate Sodium (METHOTREXATE PO) Take 2.5 mg by mouth. 3 tablets on Saturday and 3 tablets on Katherine     Multiple Vitamins-Minerals (PRESERVISION AREDS 2 PO) Take by mouth 2 (two) times daily.     OVER THE COUNTER MEDICATION Take 1 tablet by mouth 2 (two) times daily. Algae-Cal='750mg'$  Calcium, '65mg'$  Magnesium, and 1000 units of Vitamin D     Propylene Glycol (SYSTANE BALANCE OP) Apply 1 drop to eye as needed.     rosuvastatin (CRESTOR) 20 MG tablet Take 20 mg by mouth 3 (three) times a week.     tocilizumab (ACTEMRA) 400 MG/20ML SOLN injection Inject into the vein once.     traMADol (ULTRAM) 50 MG tablet Take 1-2 tablets (50-100 mg total) by mouth every 6 (six)  hours as needed. 80 tablet 1   vitamin C (ASCORBIC ACID) 500 MG tablet Take 500 mg by mouth as needed.      VITAMIN D, ERGOCALCIFEROL, PO Take 10,000 Units by mouth 3 (three) times a week. On Wednesdays     diphenhydrAMINE (SOMINEX) 25 MG tablet Take 25 mg by mouth as needed for allergies or sleep (take 12.5 to 25 mg as needed).     oxymetazoline (AFRIN) 0.05 % nasal spray Place 2 sprays into the nose as needed. For congestion     No facility-administered medications prior to visit.     Allergies:   Morphine and related and Sulfa antibiotics  Social History   Socioeconomic History   Marital status: Divorced    Spouse name: Not on file   Number of children: Not on file   Years of education: Not on file   Highest education level: Not on file  Occupational History   Not on file  Tobacco Use   Smoking status: Former    Packs/day: 0.50    Years: 15.00    Total pack years: 7.50    Types: Cigarettes    Quit date: 02/07/2005    Years since quitting: 17.0   Smokeless tobacco: Never  Substance and Sexual Activity   Alcohol use: No   Drug use: No   Sexual activity: Not on file  Other Topics Concern   Not on file  Social History Narrative   Not on file   Social Determinants of Health   Financial Resource Strain: Not on file  Food Insecurity: Not on file  Transportation Needs: Not on file  Physical Activity: Not on file  Stress: Not on file  Social Connections: Not on file    Additional social history is notable that she was born in [place] and grew up in [place], [place].  She has been divorced since 1984.  She has 3 children and 6 grandchildren.  She lives by herself.  She walks with a walker.  Previously she was a Music therapist at [employer].  She attended college at [employer] today.  There is remote tobacco history but she quit smoking in 2000.  She exercises on a recumbent bike 2-3 times per week for approximately 20 minutes.  Family History: Her mother died at age  85 and had throat cancer.  Her father died at age 69 and had lung cancer.  She has a sister age 66.  She has 3 sons ages 73, 62, and 84.  ROS General: Negative; No fevers, chills, or night sweats;  HEENT: Negative; No changes in vision or hearing, sinus congestion, difficulty swallowing Pulmonary: Negative; No cough, wheezing, shortness of breath, hemoptysis Cardiovascular: See HPI GI: Negative; No nausea, vomiting, diarrhea, or abdominal pain GU: Negative; No dysuria, hematuria, or difficulty voiding Musculoskeletal: In 2011 she broke her left kneecap.  She has undergone 6 knee surgeries in 4 years.  She has a history of rheumatoid arthritis and had seen Dr. Charlestine Night and most recently sees Dr. Amil Amen Hematologic/Oncology: Negative; no easy bruising, bleeding Endocrine: Negative; no heat/cold intolerance; no diabetes Neuro: Negative; no changes in balance, headaches Skin: Negative; No rashes or skin lesions Psychiatric: Negative; No behavioral problems, depression Sleep: Negative; No snoring, daytime sleepiness, hypersomnolence, bruxism, restless legs, hypnogognic hallucinations, no cataplexy Other comprehensive 14 point system review is negative.   PHYSICAL EXAM:   VS:  BP 128/82   Pulse 69   Ht '5\' 3"'$  (1.6 m)   Wt 209 lb 9.6 oz (95.1 kg)   SpO2 93%   BMI 37.13 kg/m     Repeat blood pressure by me was 130/80  Wt Readings from Last 3 Encounters:  03/02/22 209 lb 9.6 oz (95.1 kg)  11/11/20 219 lb (99.3 kg)  08/13/20 217 lb (98.4 kg)    General: Alert, oriented, no distress.  Skin: normal turgor, no rashes, warm and dry HEENT: Normocephalic, atraumatic. Pupils equal round and reactive to light; sclera anicteric; extraocular muscles intact;  Nose without nasal septal hypertrophy Mouth/Parynx benign; Mallinpatti scale 3 Neck: No JVD, no carotid bruits; normal carotid upstroke Lungs: clear to ausculatation and percussion; no wheezing or rales Chest wall: without  tenderness to  palpitation Heart: PMI not displaced, RRR, s1 s2 normal, 2-3/6 mid peaking systolic murmur, no diastolic murmur, no rubs, gallops, thrills, or heaves Abdomen: soft, nontender; no hepatosplenomehaly, BS+; abdominal aorta nontender and not dilated by palpation. Back: no CVA tenderness Pulses 2+ Musculoskeletal: full range of motion, normal strength, no joint deformities Extremities: no clubbing cyanosis or edema, Homan's sign negative  Neurologic: grossly nonfocal; Cranial nerves grossly wnl Psychologic: Normal mood and affect     Studies/Labs Reviewed:   March 02, 2022 ECG (independently read by me): Sinus rhythm at 69, 1st degree AV block; PR 308 msec  November 11, 2020 ECG (independently read by me): NSR at 74; 1st degree AV block, PR 306 msec, QS V1-3, ST abnormality I, aVL.  August 13, 2020 ECG (independently read by me): Normal sinus rhythm at 80 bpm, nonspecific ST T wave abnormality in lead aVL.  First-degree AV block with appearing to 80 ms.  No ectopy.  Recent Labs:    Latest Ref Rng & Units 09/15/2020   10:41 AM 02/10/2016   10:37 AM 07/22/2011    3:45 AM  BMP  Glucose 65 - 99 mg/dL 94  115  192   BUN 8 - 27 mg/dL '11  11  6   '$ Creatinine 0.57 - 1.00 mg/dL 0.48  0.44  0.37   BUN/Creat Ratio 12 - 28 23     Sodium 134 - 144 mmol/L 140  137  137   Potassium 3.5 - 5.2 mmol/L 4.5  4.0  4.1   Chloride 96 - 106 mmol/L 100  102  104   CO2 20 - 29 mmol/L '29  28  25   '$ Calcium 8.7 - 10.3 mg/dL 8.8  8.7  8.7         Latest Ref Rng & Units 09/15/2020   10:41 AM 02/10/2016   10:37 AM 07/15/2011   10:50 AM  Hepatic Function  Total Protein 6.0 - 8.5 g/dL 6.2  6.4  7.0   Albumin 3.6 - 4.6 g/dL 4.3  3.9  3.5   AST 0 - 40 IU/L '17  16  10   '$ ALT 0 - 32 IU/L '10  12  10   '$ Alk Phosphatase 44 - 121 IU/L 44  46  55   Total Bilirubin 0.0 - 1.2 mg/dL 0.8  0.7  0.4        Latest Ref Rng & Units 02/10/2016   10:37 AM 09/19/2012   11:15 AM 07/23/2011    5:35 AM  CBC  WBC 4.0 - 10.5 K/uL 8.3  7.6   10.4   Hemoglobin 12.0 - 15.0 g/dL 13.0  13.1  11.7   Hematocrit 36.0 - 46.0 % 39.5  39.8  35.2   Platelets 150 - 400 K/uL 207  281  327    Lab Results  Component Value Date   MCV 99.0 02/10/2016   MCV 101.5 (H) 09/19/2012   MCV 101.1 (H) 07/23/2011   No results found for: "TSH" No results found for: "HGBA1C"   BNP No results found for: "BNP"  ProBNP No results found for: "PROBNP"   Lipid Panel     Component Value Date/Time   CHOL 160 09/15/2020 1041   TRIG 169 (H) 09/15/2020 1041   HDL 59 09/15/2020 1041   CHOLHDL 2.7 09/15/2020 1041   LDLCALC 73 09/15/2020 1041   LABVLDL 28 09/15/2020 1041     RADIOLOGY: No results found.   Additional studies/ records that were reviewed today  include:   I reviewed the comprehensive records of Dr. Elta Guadeloupe Perrini.  ECHO 01/28/2018 Study Conclusions  - Left ventricle: The cavity size was normal. Systolic function was    normal. The estimated ejection fraction was in the range of 60%    to 65%. Wall motion was normal; there were no regional wall    motion abnormalities. The study is indeterminate for the grading    of LV diastolic function.  - Aortic valve: Moderately calcified annulus. Probably trileaflet;    moderately thickened, moderately calcified leaflets. Right    coronary cusp mobility was moderately restricted. There was mild    stenosis.  - Mitral valve: Calcified annulus. There was trivial regurgitation.  - Right ventricle: Systolic function was normal.  - Atrial septum: No defect or patent foramen ovale was identified.  - Tricuspid valve: There was no significant regurgitation.   Impressions:   - Normal LV EF. Thickened, calcified aortic valve and ascending    aorta with mild stenosis.    CT HEART: 01/24/2018 CORONARY CALCIUM   Total Agatston Score: 505 with extensive calcifications throughout the left anterior descending coronary artery. Calcifications also noted in the proximal left circumflex and in the  mid RCA   MESA database percentile:  81   OTHER FINDINGS:   Cardiovascular: Heart is normal size. Aorta is normal caliber. Scattered aortic calcifications.   Mediastinum/Nodes: No adenopathy in the lower mediastinum or hila.   Lungs/Pleura: Peripheral interstitial prominence compatible with scarring in the lung bases. No effusions or confluent opacities.   Upper Abdomen: Imaging into the upper abdomen shows no acute findings.   Musculoskeletal: Chest wall soft tissues are unremarkable. No acute bony abnormality.   IMPRESSION: The observed calcium score of 505 is at the percentile 81 for subjects of the same age, gender and race/ethnicity who are free of clinical cardiovascular disease and treated diabetes.   No acute extra cardiac abnormality.   Aortic atherosclerosis.   ECHO 09/08/2020   IMPRESSIONS   1. Left ventricular ejection fraction, by estimation, is 65 to 70%. The  left ventricle has normal function. The left ventricle has no regional  wall motion abnormalities. Left ventricular diastolic parameters are  consistent with Grade I diastolic  dysfunction (impaired relaxation).   2. Right ventricular systolic function is normal. The right ventricular  size is normal.   3. Left atrial size was mildly dilated.   4. The mitral valve is normal in structure. Trivial mitral valve  regurgitation. No evidence of mitral stenosis.   5. The aortic valve has an indeterminant number of cusps. There is  moderate calcification of the aortic valve. Aortic valve regurgitation is  not visualized. Moderate aortic valve stenosis. Aortic valve area, by VTI  measures 1.31 cm. Aortic valve mean  gradient measures 27.7 mmHg. Aortic valve Vmax measures 3.66 m/s.   6. The inferior vena cava is normal in size with greater than 50%  respiratory variability, suggesting right atrial pressure of 3 mmHg.   Comparison(s): 01/17/18 EF 60-65%. Mild AS 45mHg mean PG, 355mg peak PG.    ECHO:  09/07/2021  1. Left ventricular ejection fraction, by estimation, is 55 to 60%. Left  ventricular ejection fraction by PLAX is 59 %. The left ventricle has  normal function. The left ventricle has no regional wall motion  abnormalities. Left ventricular diastolic  parameters are consistent with Grade I diastolic dysfunction (impaired  relaxation). The average left ventricular global longitudinal strain is  -23.5 %. The global longitudinal strain is  normal.   2. Right ventricular systolic function is normal. The right ventricular  size is normal. There is normal pulmonary artery systolic pressure.   3. The mitral valve is normal in structure. Trivial mitral valve  regurgitation. No evidence of mitral stenosis.   4. The aortic valve is normal in structure. There is moderate  calcification of the aortic valve. There is moderate thickening of the  aortic valve. Aortic valve regurgitation is not visualized. Moderate  aortic valve stenosis. Aortic valve area, by VTI  measures 0.99 cm. Aortic valve mean gradient measures 32.0 mmHg. Aortic  valve Vmax measures 3.64 m/s.   5. Aortic dilatation noted. There is mild dilatation of the ascending  aorta, measuring 38 mm.   6. The inferior vena cava is normal in size with greater than 50%  respiratory variability, suggesting right atrial pressure of 3 mmHg.   Comparison(s): 09/08/20 EF 65-70%. Moderate AS 99mHg mean PG, 528mg peak  PG. GLS -23%.   ASSESSMENT:    1. Nonrheumatic aortic valve stenosis   2. Elevated coronary artery calcium score: 505 Agatson units, December 2019   3. Hyperlipidemia with target LDL less than 70   4. Rheumatoid arthritis involving multiple sites, unspecified whether rheumatoid factor present ([facility]  5. Aortic atherosclerosis (HCC)     PLAN:  [name] an very pleasant [age]ear old female patient who is a former patient of Dr. AnSharene Butterss well as Dr. GeElecta Sniff She subsequently has been followed by Dr.  MaCrist Infanteor many years.  She has been documented to have an elevated coronary calcium score of 505 agatston units on CT imaging in December 2019 with extensive calcification throughout the LAD and also calcification present in the proximal left circumflex and in the mid RCA.  She has a history of hyperlipidemia, and lipid studies in February 2022 showed total cholesterol 179, HDL 60, triglycerides 171, and LDL 85.  At the time she was on rosuvastatin 10 mg which was increased to 20 mg.  Subsequent laboratory in August 2022 showed total cholesterol 160, LDL cholesterol 73.  When I saw her for my initial evaluation, she had a 2-3 over 6 harsh systolic murmur suggestive of probable moderate aortic stenosis with transmission to her carotids.  An echo Doppler study in 2019 had shown mild AAS with a mean gradient of 15 and peak instantaneous gradient of 37 mm.  When I initially saw her I recommended she undergo a follow-up echo Doppler study.  This now shows progression of her aortic valve disease to now moderate aortic stenosis.  She continues to have hyperdynamic LV function with EF at 65 to 70% without significant left ventricular hypertrophy or wall motion abnormalities.  She does have grade 1 diastolic dysfunction.  On her echo in August 2022, her mean gradient was 27 with a peak instantaneous gradient at 53 mm and estimated aortic valve area is 1.31 cm consistent with moderate aortic stenosis.  She underwent a 1 year follow-up echo Doppler study on September 07, 2021 mean gradient has slightly increased to 32 mm with a peak gradient now at 5 3 mmHg.  Estimated aortic valve area was 1.0 cm.  She sees Dr. PeJoylene Draftegularly.  Presently she remains completely asymptomatic but she walks with a walker.  She is unaware of chest pain or change in walking ability and denies shortness of breath, presyncope or syncope.  She continues to undergo an Actemra infusion therapy for her rheumatoid arthritis and is followed by  Dr.  Amil Amen.  She is now taking rosuvastatin 20 mg 3 times per week.  She also takes methotrexate.  Her blood pressure today remains stable.  I have recommended that she undergo a follow-up echo Doppler study in August for reassessment of her aortic stenosis and I will see her in September/October for follow-up evaluation.  I did discuss potential future need for TAVR.  She will contact us if she notes change in symptomatology.   Medication Adjustments/Labs and Tests Ordered: Current medicines are reviewed at length with the patient today.  Concerns regarding medicines are outlined above.  Medication changes, Labs and Tests ordered today are listed in the Patient Instructions below. Patient Instructions   Testing/Procedures:  Your physician has requested that you have an echocardiogram. Echocardiography is a painless test that uses sound waves to create images of your heart. It provides your doctor with information about the size and shape of your heart and how well your heart's chambers and valves are working. This procedure takes approximately one hour. There are no restrictions for this procedure. Please do NOT wear cologne, perfume, aftershave, or lotions (deodorant is allowed). Please arrive 15 minutes prior to your appointment time. [name]   Follow-Up: At [facility], you and your health needs are our priority.  As part of our continuing mission to provide you with exceptional heart care, we have created designated Provider Care Teams.  These Care Teams include your primary Cardiologist (physician) and Advanced Practice Providers (APPs -  Physician Assistants and Nurse Practitioners) who all work together to provide you with the care you need, when you need it.  We recommend signing up for the patient portal called "MyChart".  Sign up information is provided on this After Visit Summary.  MyChart is used to connect with patients for Virtual Visits  (Telemedicine).  Patients are able to view lab/test results, encounter notes, upcoming appointments, etc.  Non-urgent messages can be sent to your provider as well.   To learn more about what you can do with MyChart, go to [email or web address].    Your next appointment:   8-9 month(s)  Provider:   Shelva Majestic MD      Signed, Shelva Majestic, MD  03/04/2022 12:42 PM    [facility] [address] [place], [place], [place]  [address] Phone: [phone]

## 2022-03-02 NOTE — Patient Instructions (Signed)
  Testing/Procedures:  Your physician has requested that you have an echocardiogram. Echocardiography is a painless test that uses sound waves to create images of your heart. It provides your doctor with information about the size and shape of your heart and how well your heart's chambers and valves are working. This procedure takes approximately one hour. There are no restrictions for this procedure. Please do NOT wear cologne, perfume, aftershave, or lotions (deodorant is allowed). Please arrive 15 minutes prior to your appointment time. Sasakwa Inwood   Follow-Up: At [facility], you and your health needs are our priority.  As part of our continuing mission to provide you with exceptional heart care, we have created designated Provider Care Teams.  These Care Teams include your primary Cardiologist (physician) and Advanced Practice Providers (APPs -  Physician Assistants and Nurse Practitioners) who all work together to provide you with the care you need, when you need it.  We recommend signing up for the patient portal called "MyChart".  Sign up information is provided on this After Visit Summary.  MyChart is used to connect with patients for Virtual Visits (Telemedicine).  Patients are able to view lab/test results, encounter notes, upcoming appointments, etc.  Non-urgent messages can be sent to your provider as well.   To learn more about what you can do with MyChart, go to [email or web address].    Your next appointment:   8-9 month(s)  Provider:   Shelva Majestic MD

## 2022-03-04 ENCOUNTER — Encounter: Payer: Self-pay | Admitting: Cardiovascular Disease

## 2022-04-05 DIAGNOSIS — Z79899 Other long term (current) drug therapy: Secondary | ICD-10-CM | POA: Diagnosis not present

## 2022-04-05 DIAGNOSIS — M0579 Rheumatoid arthritis with rheumatoid factor of multiple sites without organ or systems involvement: Secondary | ICD-10-CM | POA: Diagnosis not present

## 2022-05-03 DIAGNOSIS — M0579 Rheumatoid arthritis with rheumatoid factor of multiple sites without organ or systems involvement: Secondary | ICD-10-CM | POA: Diagnosis not present

## 2022-05-05 DIAGNOSIS — Z79899 Other long term (current) drug therapy: Secondary | ICD-10-CM | POA: Diagnosis not present

## 2022-05-05 DIAGNOSIS — K219 Gastro-esophageal reflux disease without esophagitis: Secondary | ICD-10-CM | POA: Diagnosis not present

## 2022-05-05 DIAGNOSIS — R7301 Impaired fasting glucose: Secondary | ICD-10-CM | POA: Diagnosis not present

## 2022-05-05 DIAGNOSIS — M810 Age-related osteoporosis without current pathological fracture: Secondary | ICD-10-CM | POA: Diagnosis not present

## 2022-05-05 DIAGNOSIS — E785 Hyperlipidemia, unspecified: Secondary | ICD-10-CM | POA: Diagnosis not present

## 2022-05-05 DIAGNOSIS — E538 Deficiency of other specified B group vitamins: Secondary | ICD-10-CM | POA: Diagnosis not present

## 2022-05-12 DIAGNOSIS — M810 Age-related osteoporosis without current pathological fracture: Secondary | ICD-10-CM | POA: Diagnosis not present

## 2022-05-12 DIAGNOSIS — E785 Hyperlipidemia, unspecified: Secondary | ICD-10-CM | POA: Diagnosis not present

## 2022-05-12 DIAGNOSIS — R7301 Impaired fasting glucose: Secondary | ICD-10-CM | POA: Diagnosis not present

## 2022-05-12 DIAGNOSIS — E538 Deficiency of other specified B group vitamins: Secondary | ICD-10-CM | POA: Diagnosis not present

## 2022-05-12 DIAGNOSIS — I350 Nonrheumatic aortic (valve) stenosis: Secondary | ICD-10-CM | POA: Diagnosis not present

## 2022-05-12 DIAGNOSIS — M069 Rheumatoid arthritis, unspecified: Secondary | ICD-10-CM | POA: Diagnosis not present

## 2022-05-12 DIAGNOSIS — E669 Obesity, unspecified: Secondary | ICD-10-CM | POA: Diagnosis not present

## 2022-05-12 DIAGNOSIS — R030 Elevated blood-pressure reading, without diagnosis of hypertension: Secondary | ICD-10-CM | POA: Diagnosis not present

## 2022-05-12 DIAGNOSIS — Z96652 Presence of left artificial knee joint: Secondary | ICD-10-CM | POA: Diagnosis not present

## 2022-05-12 DIAGNOSIS — L84 Corns and callosities: Secondary | ICD-10-CM | POA: Diagnosis not present

## 2022-05-12 DIAGNOSIS — Z0000 Encounter for general adult medical examination without abnormal findings: Secondary | ICD-10-CM | POA: Diagnosis not present

## 2022-05-12 DIAGNOSIS — Z96651 Presence of right artificial knee joint: Secondary | ICD-10-CM | POA: Diagnosis not present

## 2022-05-12 DIAGNOSIS — M25562 Pain in left knee: Secondary | ICD-10-CM | POA: Diagnosis not present

## 2022-05-12 DIAGNOSIS — R82998 Other abnormal findings in urine: Secondary | ICD-10-CM | POA: Diagnosis not present

## 2022-05-12 DIAGNOSIS — I2510 Atherosclerotic heart disease of native coronary artery without angina pectoris: Secondary | ICD-10-CM | POA: Diagnosis not present

## 2022-05-31 DIAGNOSIS — M0579 Rheumatoid arthritis with rheumatoid factor of multiple sites without organ or systems involvement: Secondary | ICD-10-CM | POA: Diagnosis not present

## 2022-06-01 DIAGNOSIS — Z1211 Encounter for screening for malignant neoplasm of colon: Secondary | ICD-10-CM | POA: Diagnosis not present

## 2022-06-07 ENCOUNTER — Encounter: Payer: Self-pay | Admitting: Podiatry

## 2022-06-07 ENCOUNTER — Ambulatory Visit (INDEPENDENT_AMBULATORY_CARE_PROVIDER_SITE_OTHER): Payer: Medicare Other | Admitting: Podiatry

## 2022-06-07 DIAGNOSIS — L84 Corns and callosities: Secondary | ICD-10-CM

## 2022-06-07 DIAGNOSIS — M069 Rheumatoid arthritis, unspecified: Secondary | ICD-10-CM | POA: Diagnosis not present

## 2022-06-07 NOTE — Progress Notes (Signed)
  Subjective:  Patient ID: [name], female    DOB: 1935/03/04,   MRN: [id number]  Chief Complaint  Patient presents with   Callouses    Patient has a callouse on her right ball of foot, painful when walking     87 y.o. female presents for concern as above. She has a history of RA. She has been filing down and using corn pads but has recently worsened.   . Denies any other pedal complaints. Denies n/v/f/c.   Past Medical History:  Diagnosis Date   Arthritis    rheumatoid   PONV (postoperative nausea and vomiting)    none recently   Shingles 04/21/14   seen by Dr Waynard Edwards and treated with Valtrex    Objective:  Physical Exam: Vascular: DP/PT pulses 2/4 bilateral. CFT <3 seconds. Normal hair growth on digits. No edema.  Skin. No lacerations or abrasions bilateral feet. Hyperkeratotic lesion noted to third metatarsal on the right with near ulceration underneath. Pre-ulcerative.  Musculoskeletal: MMT 5/5 bilateral lower extremities in DF, PF, Inversion and Eversion. Deceased ROM in DF of ankle joint.  Neurological: Sensation intact to light touch.   Assessment:   1. Pre-ulcerative calluses   2. Rheumatoid arthritis involving both feet, unspecified whether rheumatoid factor present (HCC)      Plan:  Patient was evaluated and treated and all questions answered. -Discussed corns and calluses with patient and treatment options.  -Hyperkeratotic tissue was debrided with chisel without incident.  -Metatrsal padding provided.  -Encouraged daily moisturizing -Discussed use of pumice stone -Advised good supportive shoes and inserts -Patient to return to office as needed or sooner if condition worsens.   Louann Sjogren, DPM

## 2022-06-28 DIAGNOSIS — M0579 Rheumatoid arthritis with rheumatoid factor of multiple sites without organ or systems involvement: Secondary | ICD-10-CM | POA: Diagnosis not present

## 2022-06-28 DIAGNOSIS — Z79899 Other long term (current) drug therapy: Secondary | ICD-10-CM | POA: Diagnosis not present

## 2022-07-01 ENCOUNTER — Encounter: Payer: Self-pay | Admitting: Internal Medicine

## 2022-07-12 DIAGNOSIS — M7989 Other specified soft tissue disorders: Secondary | ICD-10-CM | POA: Diagnosis not present

## 2022-07-12 DIAGNOSIS — M0579 Rheumatoid arthritis with rheumatoid factor of multiple sites without organ or systems involvement: Secondary | ICD-10-CM | POA: Diagnosis not present

## 2022-07-12 DIAGNOSIS — Z79899 Other long term (current) drug therapy: Secondary | ICD-10-CM | POA: Diagnosis not present

## 2022-07-12 DIAGNOSIS — M810 Age-related osteoporosis without current pathological fracture: Secondary | ICD-10-CM | POA: Diagnosis not present

## 2022-07-12 DIAGNOSIS — M1991 Primary osteoarthritis, unspecified site: Secondary | ICD-10-CM | POA: Diagnosis not present

## 2022-07-12 DIAGNOSIS — E669 Obesity, unspecified: Secondary | ICD-10-CM | POA: Diagnosis not present

## 2022-07-12 DIAGNOSIS — M5431 Sciatica, right side: Secondary | ICD-10-CM | POA: Diagnosis not present

## 2022-07-12 DIAGNOSIS — Z6836 Body mass index (BMI) 36.0-36.9, adult: Secondary | ICD-10-CM | POA: Diagnosis not present

## 2022-07-26 DIAGNOSIS — M0579 Rheumatoid arthritis with rheumatoid factor of multiple sites without organ or systems involvement: Secondary | ICD-10-CM | POA: Diagnosis not present

## 2022-08-23 DIAGNOSIS — M0579 Rheumatoid arthritis with rheumatoid factor of multiple sites without organ or systems involvement: Secondary | ICD-10-CM | POA: Diagnosis not present

## 2022-09-05 ENCOUNTER — Other Ambulatory Visit: Payer: Self-pay | Admitting: Internal Medicine

## 2022-09-05 DIAGNOSIS — Z1231 Encounter for screening mammogram for malignant neoplasm of breast: Secondary | ICD-10-CM

## 2022-09-06 DIAGNOSIS — H353132 Nonexudative age-related macular degeneration, bilateral, intermediate dry stage: Secondary | ICD-10-CM | POA: Diagnosis not present

## 2022-09-06 DIAGNOSIS — H524 Presbyopia: Secondary | ICD-10-CM | POA: Diagnosis not present

## 2022-09-06 DIAGNOSIS — Z961 Presence of intraocular lens: Secondary | ICD-10-CM | POA: Diagnosis not present

## 2022-09-20 ENCOUNTER — Ambulatory Visit (HOSPITAL_COMMUNITY): Payer: Medicare Other | Attending: Cardiovascular Disease

## 2022-09-20 DIAGNOSIS — Z79899 Other long term (current) drug therapy: Secondary | ICD-10-CM | POA: Diagnosis not present

## 2022-09-20 DIAGNOSIS — Z111 Encounter for screening for respiratory tuberculosis: Secondary | ICD-10-CM | POA: Diagnosis not present

## 2022-09-20 DIAGNOSIS — I350 Nonrheumatic aortic (valve) stenosis: Secondary | ICD-10-CM | POA: Diagnosis not present

## 2022-09-20 DIAGNOSIS — M0579 Rheumatoid arthritis with rheumatoid factor of multiple sites without organ or systems involvement: Secondary | ICD-10-CM | POA: Diagnosis not present

## 2022-09-20 DIAGNOSIS — R5383 Other fatigue: Secondary | ICD-10-CM | POA: Diagnosis not present

## 2022-09-20 LAB — ECHOCARDIOGRAM COMPLETE
AR max vel: 1.01 cm2
AV Area VTI: 0.98 cm2
AV Area mean vel: 0.93 cm2
AV Mean grad: 42 mmHg
AV Peak grad: 72.3 mmHg
Ao pk vel: 4.25 m/s
Area-P 1/2: 3.06 cm2
S' Lateral: 2.8 cm

## 2022-09-27 ENCOUNTER — Ambulatory Visit: Admission: RE | Admit: 2022-09-27 | Payer: Medicare Other | Source: Ambulatory Visit

## 2022-09-27 DIAGNOSIS — Z1231 Encounter for screening mammogram for malignant neoplasm of breast: Secondary | ICD-10-CM

## 2022-10-18 DIAGNOSIS — M0579 Rheumatoid arthritis with rheumatoid factor of multiple sites without organ or systems involvement: Secondary | ICD-10-CM | POA: Diagnosis not present

## 2022-11-02 ENCOUNTER — Encounter: Payer: Self-pay | Admitting: Internal Medicine

## 2022-11-02 ENCOUNTER — Other Ambulatory Visit (HOSPITAL_BASED_OUTPATIENT_CLINIC_OR_DEPARTMENT_OTHER): Payer: Self-pay

## 2022-11-02 ENCOUNTER — Ambulatory Visit (INDEPENDENT_AMBULATORY_CARE_PROVIDER_SITE_OTHER): Payer: Medicare Other | Admitting: Internal Medicine

## 2022-11-02 VITALS — BP 138/88 | HR 65 | Ht 63.0 in | Wt 212.0 lb

## 2022-11-02 DIAGNOSIS — R195 Other fecal abnormalities: Secondary | ICD-10-CM

## 2022-11-02 DIAGNOSIS — Z86010 Personal history of colonic polyps: Secondary | ICD-10-CM | POA: Diagnosis not present

## 2022-11-02 DIAGNOSIS — Z23 Encounter for immunization: Secondary | ICD-10-CM | POA: Diagnosis not present

## 2022-11-02 DIAGNOSIS — I350 Nonrheumatic aortic (valve) stenosis: Secondary | ICD-10-CM | POA: Diagnosis not present

## 2022-11-02 MED ORDER — COVID-19 MRNA VAC-TRIS(PFIZER) 30 MCG/0.3ML IM SUSY
0.3000 mL | PREFILLED_SYRINGE | Freq: Once | INTRAMUSCULAR | 0 refills | Status: AC
  Filled 2022-11-02: qty 0.3, 1d supply, fill #0

## 2022-11-02 NOTE — Progress Notes (Signed)
Patient ID: [name], female   DOB: May 23, 1935, 87 y.o.   MRN: [id number] HPI: [name] is an 87 year old female with a history of colon polyps, severe aortic stenosis, hypothyroidism, hyperlipidemia, rheumatoid arthritis who is seen to discuss a positive Cologuard test.  She is here alone today.  She has a history of a positive Cologuard test in 2018 which led to a colonoscopy with Dr. Kinnie Scales in early 2019.  During this colonoscopy a rectal tubular adenoma was removed which was 5 mm.  She reports from a GI perspective she is feeling well.  She has noticed some dizziness but has no chest pain, shortness of breath.  Some minor ankle swelling in the evening.  She has been told recently after an echocardiogram that her aortic valve stenosis has progressed to severe.  She was scheduled an appointment with Dr. Tresa Endo in December but she was hoping to be seen sooner and she will be seen by Edd Fabian on October 9.  From a bowel perspective she reports regular bowel movements.  No diarrhea or constipation.  No blood in stool or melena.  No frequent abdominal pain.  No upper GI or hepatobiliary complaint.  She does not have a family history of colorectal cancer.  She is interested in colonoscopy if we feel this is appropriate.  Past Medical History:  Diagnosis Date   Allergic rhinitis    Arthritis    rheumatoid   GERD (gastroesophageal reflux disease)    Hyperlipidemia    Hyperthyroidism    Hypothyroidism    Macular degeneration    Mild aortic stenosis    Osteoporosis    PONV (postoperative nausea and vomiting)    none recently   Shingles 04/21/2014   seen by Dr Waynard Edwards and treated with Valtrex    Past Surgical History:  Procedure Laterality Date   ABDOMINAL HYSTERECTOMY  1984   ovarys left in   BREAST BIOPSY Right 2018   CHOLECYSTECTOMY  2005   DILATION AND CURETTAGE OF UTERUS     JOINT REPLACEMENT  1990   right knee   KNEE RECONSTRUCTION Left 09/26/12   left knee  replacement  2012   x2 surgeries-ruptures patella tendon, fractured patella   PATELLAR TENDON REPAIR  07/21/2011   Procedure: PATELLA TENDON REPAIR;  Surgeon: Loanne Drilling, MD;  Location: [facility];  Service: Orthopedics;  Laterality: Left;  Left Knee Patella Tendon Reconstruction with allograft   QUADRICEPS TENDON REPAIR Left 09/26/2012   Procedure: LEFT EXTENSOR MECHANISM RECONSTRUCTION/WITH GRAFT;  Surgeon: Loanne Drilling, MD;  Location: [facility];  Service: Orthopedics;  Laterality: Left;   REPAIR EXTENSOR TENDON Right 03/05/2015   Procedure: RECONSTRUCTION EXTENSOR HOOD RIGHT MIDDLE FINGER;  Surgeon: Cindee Salt, MD;  Location: [facility];  Service: Orthopedics;  Laterality: Right;  ANESTHESIA:  IV REGIONAL FAB   TONSILLECTOMY     as child   wrist bilateral  [id number]   for arthritis    Outpatient Medications Prior to Visit  Medication Sig Dispense Refill   acetaminophen (TYLENOL) 500 MG tablet Take 1,000 mg by mouth every 6 (six) hours as needed. For headache     diphenhydrAMINE (BENADRYL ALLERGY) 25 MG tablet Take 1/2 tablet po 2x a day Oral     estrogens, conjugated, (PREMARIN) 0.3 MG tablet Take 0.3 mg by mouth daily with breakfast.      folic acid (FOLVITE) 1 MG tablet Take 1 mg by mouth daily with breakfast.     Methotrexate Sodium (METHOTREXATE PO)  Take 2.5 mg by mouth. 3 tablets on Saturday and 3 tablets on Sunday     Multiple Vitamins-Minerals (PRESERVISION AREDS 2 PO) Take by mouth 2 (two) times daily.     Propylene Glycol (SYSTANE BALANCE OP) Apply 1 drop to eye as needed.     psyllium (METAMUCIL) 58.6 % packet Take 1 packet by mouth daily.     RESTASIS 0.05 % ophthalmic emulsion Place 1 drop into both eyes 2 (two) times daily.     rosuvastatin (CRESTOR) 20 MG tablet Take 20 mg by mouth 3 (three) times a week.     saccharomyces boulardii (FLORASTOR) 250 MG capsule Take 250 mg by mouth 2 (two) times daily.     tocilizumab (ACTEMRA) 400 MG/20ML SOLN injection Inject  into the vein once.     vitamin C (ASCORBIC ACID) 500 MG tablet Take 500 mg by mouth as needed.      VITAMIN D, ERGOCALCIFEROL, PO Take 10,000 Units by mouth 3 (three) times a week. On Wednesdays     traMADol (ULTRAM) 50 MG tablet Take 1-2 tablets (50-100 mg total) by mouth every 6 (six) hours as needed. 80 tablet 1   OVER THE COUNTER MEDICATION Take 1 tablet by mouth 2 (two) times daily. Algae-Cal=750mg  Calcium, 65mg  Magnesium, and 1000 units of Vitamin D     No facility-administered medications prior to visit.    Allergies  Allergen Reactions   Atorvastatin     Other Reaction(s): hands hurt   Morphine And Codeine Other (See Comments)    "makes me queezy"   Sulfa Antibiotics     Hives as child-age 63    Family History  Problem Relation Age of Onset   Throat cancer Mother 37   Lung cancer Father 53   Arthritis/Rheumatoid Sister    Breast cancer Neg Hx     Social History   Tobacco Use   Smoking status: Former    Current packs/day: 0.00    Average packs/day: 0.5 packs/day for 15.0 years (7.5 ttl pk-yrs)    Types: Cigarettes    Start date: 02/07/1990    Quit date: 02/07/2005    Years since quitting: 17.7   Smokeless tobacco: Never  Substance Use Topics   Alcohol use: No   Drug use: No    ROS: As per history of present illness, otherwise negative  BP 138/88   Pulse 65   Ht 5\' 3"  (1.6 m)   Wt 212 lb (96.2 kg)   BMI 37.55 kg/m  Gen: awake, alert, NAD HEENT: anicteric  CV: RRR, 3/6 sem Pulm: CTA b/l Abd: soft, NT/ND, +BS throughout Ext: no c/c, 1+ le edema Neuro: nonfocal   RELEVANT LABS AND IMAGING: CBC    Component Value Date/Time   WBC 8.3 02/10/2016 1037   RBC 3.99 02/10/2016 1037   HGB 13.0 02/10/2016 1037   HCT 39.5 02/10/2016 1037   PLT 207 02/10/2016 1037   MCV 99.0 02/10/2016 1037   MCH 32.6 02/10/2016 1037   MCHC 32.9 02/10/2016 1037   RDW 12.9 02/10/2016 1037   LYMPHSABS 1.3 02/10/2016 1037   MONOABS 0.9 02/10/2016 1037   EOSABS 0.2  02/10/2016 1037   BASOSABS 0.0 02/10/2016 1037    CMP     Component Value Date/Time   NA 140 09/15/2020 1041   K 4.5 09/15/2020 1041   CL 100 09/15/2020 1041   CO2 29 09/15/2020 1041   GLUCOSE 94 09/15/2020 1041   GLUCOSE 115 (H) 02/10/2016 1037   BUN 11  09/15/2020 1041   CREATININE 0.48 (L) 09/15/2020 1041   CALCIUM 8.8 09/15/2020 1041   PROT 6.2 09/15/2020 1041   ALBUMIN 4.3 09/15/2020 1041   AST 17 09/15/2020 1041   ALT 10 09/15/2020 1041   ALKPHOS 44 09/15/2020 1041   BILITOT 0.8 09/15/2020 1041   GFRNONAA >60 02/10/2016 1037   GFRAA >60 02/10/2016 1037   IMPRESSIONS     1. Aortic stenosis is now severe.   2. Left ventricular ejection fraction, by estimation, is 60 to 65%. The  left ventricle has normal function. The left ventricle has no regional  wall motion abnormalities. There is moderate left ventricular hypertrophy.  Left ventricular diastolic  parameters are consistent with Grade I diastolic dysfunction (impaired  relaxation).   3. Right ventricular systolic function is normal. The right ventricular  size is normal.   4. The mitral valve is normal in structure. Trivial mitral valve  regurgitation. No evidence of mitral stenosis.   5. The aortic valve is calcified. There is severe calcifcation of the  aortic valve. There is severe thickening of the aortic valve. Aortic valve  regurgitation is not visualized. Severe aortic valve stenosis. Aortic  valve area, by VTI measures 0.98 cm.  Aortic valve mean gradient measures 42.0 mmHg. Aortic valve Vmax measures  4.25 m/s.   6. The inferior vena cava is normal in size with greater than 50%  respiratory variability, suggesting right atrial pressure of 3 mmHg.   Comparison(s): Prior images reviewed side by side. Changes from prior  study are noted. 09/07/2021 - mean AV gradient 32 mmHg.  Cologuard + 06/08/2022  ASSESSMENT/PLAN: 87 year old female with a history of colon polyps, severe aortic stenosis,  hypothyroidism, hyperlipidemia, rheumatoid arthritis who is seen to discuss a positive Cologuard test.   Positive Cologuard and history of colon polyps --we discussed this result today.  She has had adenomatous colon polyps in the past and so certainly could have additional adenomatous polyps.  My suspicion for malignancy is very low.  There is also a higher likelihood of a false positive Cologuard in older patients.  We discussed the risks and benefits of colonoscopy and right now I am most concerned by her aortic stenosis and this clearly needs to be evaluated and possibly treated before we would consider colonoscopy (particularly given the absence of alarm symptoms). -- Possible colonoscopy but after aortic valve stenosis is addressed by cardiology -- Whether this is done in the outpatient hospital setting versus [facility] will be determined based on aortic valve treatment (without treatment this would definitely need to be done in the outpatient hospital setting and carry higher than average risk  2. Severe aortic stenosis -- as seen by ECHO in Aug and progressed from moderate in the year before.  No overt symptoms, no syncope.  Mild dizziness with change in position -- She will see cardiology on October 9; they will likely consider TAVR  Follow-up with me in January, sooner if needed     QQ:[id number], Loraine Leriche, Md [address] [place],  [place] [address]

## 2022-11-02 NOTE — Patient Instructions (Signed)
You are scheduled to see Dr. Rhea Belton back in the office on 02/27/22 at 9:10 am after you have seen Cardiology.   _______________________________________________________  If your blood pressure at your visit was 140/90 or greater, please contact your primary care physician to follow up on this.  _______________________________________________________  If you are age 85 or older, your body mass index should be between 23-30. Your Body mass index is 37.55 kg/m. If this is out of the aforementioned range listed, please consider follow up with your Primary Care Provider.  If you are age 37 or younger, your body mass index should be between 19-25. Your Body mass index is 37.55 kg/m. If this is out of the aformentioned range listed, please consider follow up with your Primary Care Provider.   ________________________________________________________  The  GI providers would like to encourage you to use [facility] to communicate with providers for non-urgent requests or questions.  Due to long hold times on the telephone, sending your provider a message by [facility] may be a faster and more efficient way to get a response.  Please allow 48 business hours for a response.  Please remember that this is for non-urgent requests.  _______________________________________________________

## 2022-11-14 NOTE — Progress Notes (Unsigned)
Cardiology Clinic Note   Patient Name: [name] Date of Encounter: 11/16/2022  Primary Care Provider:  Rodrigo Ran, MD Primary Cardiologist:  Nicki Guadalajara, MD  Patient Profile    [name] 87 year old female presents the clinic today for follow-up evaluation of her hyperlipidemia and aortic valve stenosis.  Past Medical History    Past Medical History:  Diagnosis Date   Allergic rhinitis    Arthritis    rheumatoid   GERD (gastroesophageal reflux disease)    Hyperlipidemia    Hyperthyroidism    Hypothyroidism    Macular degeneration    Mild aortic stenosis    Osteoporosis    PONV (postoperative nausea and vomiting)    none recently   Shingles 04/21/2014   seen by Dr Waynard Edwards and treated with Valtrex   Past Surgical History:  Procedure Laterality Date   ABDOMINAL HYSTERECTOMY  1984   ovarys left in   BREAST BIOPSY Right 2018   CHOLECYSTECTOMY  2005   DILATION AND CURETTAGE OF UTERUS     JOINT REPLACEMENT  1990   right knee   KNEE RECONSTRUCTION Left 09/26/12   left knee replacement  2012   x2 surgeries-ruptures patella tendon, fractured patella   PATELLAR TENDON REPAIR  07/21/2011   Procedure: PATELLA TENDON REPAIR;  Surgeon: Loanne Drilling, MD;  Location: [facility];  Service: Orthopedics;  Laterality: Left;  Left Knee Patella Tendon Reconstruction with allograft   QUADRICEPS TENDON REPAIR Left 09/26/2012   Procedure: LEFT EXTENSOR MECHANISM RECONSTRUCTION/WITH GRAFT;  Surgeon: Loanne Drilling, MD;  Location: [facility];  Service: Orthopedics;  Laterality: Left;   REPAIR EXTENSOR TENDON Right 03/05/2015   Procedure: RECONSTRUCTION EXTENSOR HOOD RIGHT MIDDLE FINGER;  Surgeon: Cindee Salt, MD;  Location: [facility];  Service: Orthopedics;  Laterality: Right;  ANESTHESIA:  IV REGIONAL FAB   TONSILLECTOMY     as child   wrist bilateral  [id number]   for arthritis    Allergies  Allergies  Allergen Reactions   Atorvastatin     Other  Reaction(s): hands hurt   Morphine And Codeine Other (See Comments)    "makes me queezy"   Sulfa Antibiotics     Hives as child-age 25    History of Present Illness    [name] has a PMH of elevated coronary calcium, hyperlipidemia, aortic valve stenosis, and aortic atherosclerosis.  Echocardiogram 12/19 showed normal systolic function, moderate calcified aortic trileaflet valve with mild aortic stenosis and a mean gradient of 15 with a peak gradient of 37 mmHg.  She was initially referred by her PCP for evaluation of her elevated coronary calcium score 12/19.  She was also noted to have aortic atherosclerosis.  She denied chest pain and exertional dyspnea.  She was taking rosuvastatin.  Her LDL cholesterol was 85 2/22.  Her EKG showed sinus rhythm with first-degree AV block and nonspecific ST changes in lead aVL.  She was last seen by Dr. Tresa Endo .  She was following up for her aortic stenosis.  Her echocardiogram 8/23 showed normal EF, G1 DD, moderate stenosis of her aortic valve with a mean gradient of 32 mmHg and peak gradient of 53.  She was noted to have mild dilation of her aortic root measuring 38 mm.  She was last seen by Dr. Tresa Endo 03/02/2022.  At that time she was asymptomatic.  She denied chest pain.  She denied presyncope or syncope.  She was using a walker.  She was seen  Dr. Dierdre Forth for her RA.  She was receiving monthly infusions.  She reported bilateral knee replacements and was using left knee brace.  Her blood pressure was well-controlled.  Follow-up echocardiogram in August to reevaluate her aortic stenosis was recommended with follow-up after echocardiogram.  Potential need for TAVR was discussed.  She was encouraged to contact the office with changes in her symptoms.  Echocardiogram 09/20/2022 showed normal LVEF, G1 DD, severe aortic valve stenosis with a mean gradient of 42 mmHg.  She presents to the clinic today for evaluation and states she was unable to schedule an  appointment with Dr. Tresa Endo.  She talked to her PCP who recommended an earlier follow-up appointment.  She has noted some shortness of breath which she describes as slight over the last month.  She also has noticed some dizziness with standing.  This is also apparent with turning her head quickly.  We reviewed her echocardiogram which shows a progression in her aortic stenosis.  She expressed understanding.  We reviewed possibility of TAVR procedure.  Her blood pressure initially today is 142/86 and on recheck is 132/76.  She is somewhat limited in her physical activity due to knee pain.  She does use a recumbent bike and do leg raise type exercises without chest pain.  I will have her increase her increase her physical activity as tolerated, referred to structural heart team, and keep follow-up with Dr. Tresa Endo in December.  Today she denies chest pain,  lower extremity edema, fatigue, palpitations, melena, hematuria, hemoptysis, diaphoresis, weakness, presyncope, syncope, orthopnea, and PND.    Home Medications    Prior to Admission medications   Medication Sig Start Date End Date Taking? Authorizing Provider  acetaminophen (TYLENOL) 500 MG tablet Take 1,000 mg by mouth every 6 (six) hours as needed. For headache    [provider]  diphenhydrAMINE (BENADRYL ALLERGY) 25 MG tablet Take 1/2 tablet po 2x a day Oral 04/21/14   [provider]  estrogens, conjugated, (PREMARIN) 0.3 MG tablet Take 0.3 mg by mouth daily with breakfast.     [provider]  folic acid (FOLVITE) 1 MG tablet Take 1 mg by mouth daily with breakfast.    [provider]  Methotrexate Sodium (METHOTREXATE PO) Take 2.5 mg by mouth. 3 tablets on Saturday and 3 tablets on Sunday    [provider]  Multiple Vitamins-Minerals (PRESERVISION AREDS 2 PO) Take by mouth 2 (two) times daily.    [provider]  Propylene Glycol (SYSTANE BALANCE OP) Apply 1 drop to eye as needed.     [provider]  psyllium (METAMUCIL) 58.6 % packet Take 1 packet by mouth daily.    [provider]  RESTASIS 0.05 % ophthalmic emulsion Place 1 drop into both eyes 2 (two) times daily. 10/24/22   [provider]  rosuvastatin (CRESTOR) 20 MG tablet Take 20 mg by mouth 3 (three) times a week.    [provider]  saccharomyces boulardii (FLORASTOR) 250 MG capsule Take 250 mg by mouth 2 (two) times daily.    [provider]  tocilizumab (ACTEMRA) 400 MG/20ML SOLN injection Inject into the vein once.    [provider]  traMADol (ULTRAM) 50 MG tablet Take 1-2 tablets (50-100 mg total) by mouth every 6 (six) hours as needed. 09/28/12   [place], Hospital doctor, PA-C  vitamin C (ASCORBIC ACID) 500 MG tablet Take 500 mg by mouth as needed.     [provider]  VITAMIN D, ERGOCALCIFEROL, PO  Take 10,000 Units by mouth 3 (three) times a week. On Wednesdays    [provider]    Family History    Family History  Problem Relation Age of Onset   Throat cancer Mother 19   Lung cancer Father 77   Arthritis/Rheumatoid Sister    Breast cancer Neg Hx    She indicated that her mother is deceased. She indicated that her father is deceased. She indicated that the status of her sister is unknown. She indicated that the status of her neg hx is unknown.  Social History    Social History   Socioeconomic History   Marital status: Divorced    Spouse name: Not on file   Number of children: 3   Years of education: Not on file   Highest education level: Not on file  Occupational History   Not on file  Tobacco Use   Smoking status: Former    Current packs/day: 0.00    Average packs/day: 0.5 packs/day for 15.0 years (7.5 ttl pk-yrs)    Types: Cigarettes    Start date: 02/07/1990    Quit date: 02/07/2005    Years since quitting: 17.7   Smokeless tobacco: Never  Substance and Sexual Activity   Alcohol use: No   Drug use: No   Sexual activity:  Not on file  Other Topics Concern   Not on file  Social History Narrative   Not on file   Social Determinants of Health   Financial Resource Strain: Not on file  Food Insecurity: Not on file  Transportation Needs: Not on file  Physical Activity: Not on file  Stress: Not on file  Social Connections: Not on file  Intimate Partner Violence: Not on file     Review of Systems    General:  No chills, fever, night sweats or weight changes.  Cardiovascular:  No chest pain, dyspnea on exertion, edema, orthopnea, palpitations, paroxysmal nocturnal dyspnea. Dermatological: No rash, lesions/masses Respiratory: No cough, dyspnea Urologic: No hematuria, dysuria Abdominal:   No nausea, vomiting, diarrhea, bright red blood per rectum, melena, or hematemesis Neurologic:  No visual changes, wkns, changes in mental status. All other systems reviewed and are otherwise negative except as noted above.  Physical Exam    VS:  BP 132/76   Pulse 69   Ht 5\' 4"  (1.626 m)   Wt 212 lb (96.2 kg)   SpO2 93%   BMI 36.39 kg/m  , BMI Body mass index is 36.39 kg/m. GEN: Well nourished, well developed, in no acute distress. HEENT: normal. Neck: Supple, no JVD, carotid bruits, or masses. Cardiac: RRR, 3/6 systolic murmur heard along right sternal border, rubs, or gallops. No clubbing, cyanosis, generalized bilateral lower extremity nonpitting edema.  Radials/DP/PT 2+ and equal bilaterally.  Respiratory:  Respirations regular and unlabored, clear to auscultation bilaterally. GI: Soft, nontender, nondistended, BS + x 4. MS: no deformity or atrophy. Skin: warm and dry, no rash. Neuro:  Strength and sensation are intact. Psych: Normal affect.  Accessory Clinical Findings    Recent Labs: No results found for requested labs within last 365 days.   Recent Lipid Panel    Component Value Date/Time   CHOL 160 09/15/2020 1041   TRIG 169 (H) 09/15/2020 1041   HDL 59 09/15/2020 1041   CHOLHDL 2.7 09/15/2020  1041   LDLCALC 73 09/15/2020 1041         ECG personally reviewed by me today- EKG Interpretation Date/Time:  Wednesday November 16 2022 08:14:15 EDT  Ventricular Rate:  69 PR Interval:  240 QRS Duration:  98 QT Interval:  392 QTC Calculation: 420 R Axis:   -23  Text Interpretation: Sinus rhythm with 1st degree A-V block Left ventricular hypertrophy with repolarization abnormality ( R in aVL , Cornell product ) Confirmed by Edd Fabian [phone]) on 11/16/2022 8:34:10 AM   Coronary calcium scoring 01/24/2018 CORONARY CALCIUM   Total Agatston Score: 505 with extensive calcifications throughout the left anterior descending coronary artery. Calcifications also noted in the proximal left circumflex and in the mid RCA   MESA database percentile:  81   OTHER FINDINGS:   Cardiovascular: Heart is normal size. Aorta is normal caliber. Scattered aortic calcifications.   Mediastinum/Nodes: No adenopathy in the lower mediastinum or hila.   Lungs/Pleura: Peripheral interstitial prominence compatible with scarring in the lung bases. No effusions or confluent opacities.   Upper Abdomen: Imaging into the upper abdomen shows no acute findings.   Musculoskeletal: Chest wall soft tissues are unremarkable. No acute bony abnormality.   IMPRESSION: The observed calcium score of 505 is at the percentile 81 for subjects of the same age, gender and race/ethnicity who are free of clinical cardiovascular disease and treated diabetes.   No acute extra cardiac abnormality.   Aortic atherosclerosis.     Electronically Signed   By: Charlett Nose M.D.   On: 01/24/2018 11:56   Echocardiogram 09/20/2022 IMPRESSIONS     1. Aortic stenosis is now severe.   2. Left ventricular ejection fraction, by estimation, is 60 to 65%. The  left ventricle has normal function. The left ventricle has no regional  wall motion abnormalities. There is moderate left ventricular hypertrophy.  Left ventricular  diastolic  parameters are consistent with Grade I diastolic dysfunction (impaired  relaxation).   3. Right ventricular systolic function is normal. The right ventricular  size is normal.   4. The mitral valve is normal in structure. Trivial mitral valve  regurgitation. No evidence of mitral stenosis.   5. The aortic valve is calcified. There is severe calcifcation of the  aortic valve. There is severe thickening of the aortic valve. Aortic valve  regurgitation is not visualized. Severe aortic valve stenosis. Aortic  valve area, by VTI measures 0.98 cm.  Aortic valve mean gradient measures 42.0 mmHg. Aortic valve Vmax measures  4.25 m/s.   6. The inferior vena cava is normal in size with greater than 50%  respiratory variability, suggesting right atrial pressure of 3 mmHg.   Comparison(s): Prior images reviewed side by side. Changes from prior  study are noted. 09/07/2021 - mean AV gradient 32 mmHg.   FINDINGS   Left Ventricle: Left ventricular ejection fraction, by estimation, is 60  to 65%. The left ventricle has normal function. The left ventricle has no  regional wall motion abnormalities. The left ventricular internal cavity  size was normal in size. There is   moderate left ventricular hypertrophy. Left ventricular diastolic  parameters are consistent with Grade I diastolic dysfunction (impaired  relaxation).   Right Ventricle: The right ventricular size is normal. No increase in  right ventricular wall thickness. Right ventricular systolic function is  normal.   Left Atrium: Left atrial size was normal in size.   Right Atrium: Right atrial size was normal in size.   Pericardium: There is no evidence of pericardial effusion.   Mitral Valve: The mitral valve is normal in structure. Mild mitral annular  calcification. Trivial mitral valve regurgitation. No evidence of mitral  valve stenosis.  Tricuspid Valve: The tricuspid valve is normal in structure. Tricuspid  valve  regurgitation is not demonstrated. No evidence of tricuspid  stenosis.   Aortic Valve: The aortic valve is calcified. There is severe calcifcation  of the aortic valve. There is severe thickening of the aortic valve.  Aortic valve regurgitation is not visualized. Severe aortic stenosis is  present. Aortic valve mean gradient  measures 42.0 mmHg. Aortic valve peak gradient measures 72.2 mmHg. Aortic  valve area, by VTI measures 0.98 cm.   Pulmonic Valve: The pulmonic valve was normal in structure. Pulmonic valve  regurgitation is trivial. No evidence of pulmonic stenosis.   Aorta: The aortic root is normal in size and structure.   Venous: The inferior vena cava is normal in size with greater than 50%  respiratory variability, suggesting right atrial pressure of 3 mmHg.   IAS/Shunts: No atrial level shunt detected by color flow Doppler.   Additional Comments: Aortic stenosis is now severe.        Assessment & Plan   1.  Aortic valve stenosis-continues to be fairly physically active.  Denies decreased activity tolerance and increased work of breathing.  Does note some mild dizziness over the last month.  Echocardiogram 09/20/2022 showed severe calcification of the aortic valve, severe aortic stenosis with a mean gradient of 42.0 mmHg, aortic valve V-max measurement of 4.25 mm/s. Refer to structural heart team Maintain physical activity as tolerated  Elevated coronary calcium score, Aortic atherosclerosis-noted on coronary calcium scoring.  Denies arm neck back and chest discomfort. Continue rosuvastatin Heart healthy low-sodium diet Maintain physical activity as tolerated  Hyperlipidemia-LDL 71 on 12/14/2021. Continue rosuvastatin High-fiber diet  Disposition: Follow-up with Dr. Tresa Endo as scheduled.   Thomasene Ripple. Larra Crunkleton NP-C     11/16/2022, 8:59 AM [facility] [address] Office [phone] Fax [phone]    I spent 14  minutes examining this patient, reviewing medications, and using patient centered shared decision making involving her cardiac care.  Prior to her visit I spent greater than 20 minutes reviewing her past medical history,  medications, and prior cardiac tests.

## 2022-11-15 DIAGNOSIS — M0579 Rheumatoid arthritis with rheumatoid factor of multiple sites without organ or systems involvement: Secondary | ICD-10-CM | POA: Diagnosis not present

## 2022-11-16 ENCOUNTER — Encounter: Payer: Self-pay | Admitting: General Practice

## 2022-11-16 ENCOUNTER — Ambulatory Visit: Payer: Medicare Other | Attending: Cardiovascular Disease | Admitting: General Practice

## 2022-11-16 VITALS — BP 132/76 | HR 69 | Ht 64.0 in | Wt 212.0 lb

## 2022-11-16 DIAGNOSIS — R931 Abnormal findings on diagnostic imaging of heart and coronary circulation: Secondary | ICD-10-CM | POA: Insufficient documentation

## 2022-11-16 DIAGNOSIS — E785 Hyperlipidemia, unspecified: Secondary | ICD-10-CM | POA: Insufficient documentation

## 2022-11-16 DIAGNOSIS — I700 Atherosclerosis of aorta: Secondary | ICD-10-CM | POA: Diagnosis present

## 2022-11-16 DIAGNOSIS — I350 Nonrheumatic aortic (valve) stenosis: Secondary | ICD-10-CM | POA: Insufficient documentation

## 2022-11-16 NOTE — Patient Instructions (Signed)
Medication Instructions:  The current medical regimen is effective;  continue present plan and medications as directed. Please refer to the Current Medication list given to you today.  *If you need a refill on your cardiac medications before your next appointment, please call your pharmacy*  Lab Work: NONE  Other Instructions AMB REFERRAL TO STRUCTURAL HEART[facility] PLEASE   Follow-Up: At [facility], you and your health needs are our priority.  As part of our continuing mission to provide you with exceptional heart care, we have created designated Provider Care Teams.  These Care Teams include your primary Cardiologist (physician) and Advanced Practice Providers (APPs -  Physician Assistants and Nurse Practitioners) who all work together to provide you with the care you need, when you need it.  Your next appointment:   AS SCHEDULED   Provider:   Nicki Guadalajara, MD

## 2022-11-19 DIAGNOSIS — Z23 Encounter for immunization: Secondary | ICD-10-CM | POA: Diagnosis not present

## 2022-11-28 ENCOUNTER — Encounter: Payer: Self-pay | Admitting: Cardiovascular Disease

## 2022-11-28 ENCOUNTER — Ambulatory Visit: Payer: Medicare Other | Attending: Cardiovascular Disease | Admitting: Cardiovascular Disease

## 2022-11-28 VITALS — BP 134/78 | HR 74 | Ht 64.0 in | Wt 208.8 lb

## 2022-11-28 DIAGNOSIS — I350 Nonrheumatic aortic (valve) stenosis: Secondary | ICD-10-CM | POA: Insufficient documentation

## 2022-11-28 DIAGNOSIS — Z01810 Encounter for preprocedural cardiovascular examination: Secondary | ICD-10-CM | POA: Diagnosis not present

## 2022-11-28 LAB — CBC
Hematocrit: 45.2 % (ref 34.0–46.6)
Hemoglobin: 15 g/dL (ref 11.1–15.9)
MCH: 34.4 pg — ABNORMAL HIGH (ref 26.6–33.0)
MCHC: 33.2 g/dL (ref 31.5–35.7)
MCV: 104 fL — ABNORMAL HIGH (ref 79–97)
Platelets: 151 10*3/uL (ref 150–450)
RBC: 4.36 x10E6/uL (ref 3.77–5.28)
RDW: 11.5 % — ABNORMAL LOW (ref 11.7–15.4)
WBC: 7 10*3/uL (ref 3.4–10.8)

## 2022-11-28 NOTE — Progress Notes (Signed)
Cardiology Office Note:    Date:  11/28/2022   ID:  [name], DOB 22-Sep-1935, MRN [id number]  PCP:  Katherine Ran, MD   [facility] Providers Cardiologist:  Katherine Guadalajara, MD     Referring MD: Katherine Ran, MD   Chief Complaint  Patient presents with   Aortic Stenosis    History of Present Illness:    [name] is a 87 y.o. female presents for evaluation of severe aortic stenosis, referred by Katherine Fabian, NP and Dr. Tresa Ellis.  The patient has been followed for a heart murmur for many years.  She was initially noted to have mild aortic stenosis several years ago and then was ultimately referred for cardiology consultation with Dr. Tresa Ellis as an outpatient.  She has had annual echo studies the past few years.  She has no history of rheumatic fever, family history of aortic stenosis, or known history of bicuspid aortic valve disease.  In reviewing her old echo studies, she had aortic sclerosis in 2014, mild aortic stenosis in 2019, then developed moderate aortic stenosis in 2022 with a mean gradient of 28 mmHg, increasing the following year to a mean gradient of 32 mmHg, and on her most recent echo study she is found to have normal LVEF of 60 to 65%, peak transaortic velocity of 4.25 m/s, mean gradient of 42 mmHg, and calculated aortic valve area of 0.98 cm.  She is here with her son today. Her biggest complaint is dizziness. She feels dizzy when she first gets up, turns her head too quickly, and sometimes she is dizzy with walking. States that she's walking slower than in the past. She denies shortness of breath with exertion. Sometimes she has an 'ache' in her left chest.  This is not consistently related to physical exertion.  She has not had frank syncope.  She does complain of progressive fatigue and feels like this is likely related to her heart disease.  She denies orthopnea or PND.  She has occasional leg swelling and notes that she is nonadherent to a low-sodium  diet.    The patient has 2 grown sons, accompanied today by her son who works in the [employer].  She has had some problems with her teeth, but has regular dental care every 4 months and reports no current issues.  The patient is limited by rheumatoid arthritis.  She has had both knees replaced and has arthritis that impacts her hands.  She ambulates with a walker.  She has not been driving over the past few months because of her dizziness.  She otherwise is functionally independent.   Current Medications: Current Meds  Medication Sig   acetaminophen (TYLENOL) 500 MG tablet Take 1,000 mg by mouth every 6 (six) hours as needed. For headache   diphenhydrAMINE (BENADRYL ALLERGY) 25 MG tablet Take 1/2 tablet po 2x a day Oral   diphenhydrAMINE (BENADRYL) 50 MG/ML injection 50 mg Injection with infusions   estrogens, conjugated, (PREMARIN) 0.3 MG tablet Take 0.3 mg by mouth daily with breakfast.    folic acid (FOLVITE) 1 MG tablet Take 1 mg by mouth daily with breakfast.   Methotrexate Sodium (METHOTREXATE PO) Take 2.5 mg by mouth. 3 tablets on Saturday and 3 tablets on Sunday   Multiple Vitamins-Minerals (PRESERVISION AREDS 2 PO) Take by mouth 2 (two) times daily.   Propylene Glycol (SYSTANE BALANCE OP) Apply 1 drop to eye as needed.   psyllium (METAMUCIL) 58.6 % packet Take 1 packet  by mouth daily.   RESTASIS 0.05 % ophthalmic emulsion Place 1 drop into both eyes 2 (two) times daily.   rosuvastatin (CRESTOR) 20 MG tablet Take 20 mg by mouth 3 (three) times a week.   saccharomyces boulardii (FLORASTOR) 250 MG capsule Take 250 mg by mouth 2 (two) times daily.   tocilizumab (ACTEMRA) 400 MG/20ML SOLN injection Inject into the vein once.   traMADol (ULTRAM) 50 MG tablet Take 1-2 tablets (50-100 mg total) by mouth every 6 (six) hours as needed.   vitamin C (ASCORBIC ACID) 500 MG tablet Take 500 mg by mouth as needed.    VITAMIN D, ERGOCALCIFEROL, PO Take 10,000 Units by  mouth 3 (three) times a week. On Wednesdays     Allergies:   Atorvastatin, Morphine and codeine, and Sulfa antibiotics   ROS:   Please see the history of present illness.    All other systems reviewed and are negative.  EKGs/Labs/Other Studies Reviewed:    The following studies were reviewed today: Cardiac Studies & Procedures       ECHOCARDIOGRAM  ECHOCARDIOGRAM COMPLETE 09/20/2022  Narrative ECHOCARDIOGRAM REPORT    Patient Name:   [name] Date of Exam: 09/20/2022 Medical Rec #:  [id number]        Height:       63.0 in Accession #:    [id number]       Weight:       209.6 lb Date of Birth:  1935-02-12       BSA:          1.973 m Patient Age:    86 years         BP:           128/82 mmHg Patient Gender: F                HR:           77 bpm. Exam Location:  [address]  Procedure: 2D Echo, Cardiac Doppler and Color Doppler  Indications:    I35.0 AS  History:        Patient has prior history of Echocardiogram examinations, most recent 09/07/2021. AS; Risk Factors:Former Smoker and Dyslipidemia.  Sonographer:    Samule Ohm RDCS Referring Phys: [phone] THOMAS A KELLY  IMPRESSIONS   1. Aortic stenosis is now severe. 2. Left ventricular ejection fraction, by estimation, is 60 to 65%. The left ventricle has normal function. The left ventricle has no regional wall motion abnormalities. There is moderate left ventricular hypertrophy. Left ventricular diastolic parameters are consistent with Grade I diastolic dysfunction (impaired relaxation). 3. Right ventricular systolic function is normal. The right ventricular size is normal. 4. The mitral valve is normal in structure. Trivial mitral valve regurgitation. No evidence of mitral stenosis. 5. The aortic valve is calcified. There is severe calcifcation of the aortic valve. There is severe thickening of the aortic valve. Aortic valve regurgitation is not visualized. Severe aortic valve stenosis. Aortic valve area, by  VTI measures 0.98 cm. Aortic valve mean gradient measures 42.0 mmHg. Aortic valve Vmax measures 4.25 m/s. 6. The inferior vena cava is normal in size with greater than 50% respiratory variability, suggesting right atrial pressure of 3 mmHg.  Comparison(s): Prior images reviewed side by side. Changes from prior study are noted. 09/07/2021 - mean AV gradient 32 mmHg.  FINDINGS Left Ventricle: Left ventricular ejection fraction, by estimation, is 60 to 65%. The left ventricle has normal function. The left ventricle has no regional wall motion  abnormalities. The left ventricular internal cavity size was normal in size. There is moderate left ventricular hypertrophy. Left ventricular diastolic parameters are consistent with Grade I diastolic dysfunction (impaired relaxation).  Right Ventricle: The right ventricular size is normal. No increase in right ventricular wall thickness. Right ventricular systolic function is normal.  Left Atrium: Left atrial size was normal in size.  Right Atrium: Right atrial size was normal in size.  Pericardium: There is no evidence of pericardial effusion.  Mitral Valve: The mitral valve is normal in structure. Mild mitral annular calcification. Trivial mitral valve regurgitation. No evidence of mitral valve stenosis.  Tricuspid Valve: The tricuspid valve is normal in structure. Tricuspid valve regurgitation is not demonstrated. No evidence of tricuspid stenosis.  Aortic Valve: The aortic valve is calcified. There is severe calcifcation of the aortic valve. There is severe thickening of the aortic valve. Aortic valve regurgitation is not visualized. Severe aortic stenosis is present. Aortic valve mean gradient measures 42.0 mmHg. Aortic valve peak gradient measures 72.2 mmHg. Aortic valve area, by VTI measures 0.98 cm.  Pulmonic Valve: The pulmonic valve was normal in structure. Pulmonic valve regurgitation is trivial. No evidence of pulmonic stenosis.  Aorta: The  aortic root is normal in size and structure.  Venous: The inferior vena cava is normal in size with greater than 50% respiratory variability, suggesting right atrial pressure of 3 mmHg.  IAS/Shunts: No atrial level shunt detected by color flow Doppler.  Additional Comments: Aortic stenosis is now severe.   LEFT VENTRICLE PLAX 2D LVIDd:         4.40 cm   Diastology LVIDs:         2.80 cm   LV e' medial:    9.90 cm/s LV PW:         1.20 cm   LV E/e' medial:  10.4 LV IVS:        1.40 cm   LV e' lateral:   6.85 cm/s LVOT diam:     2.00 cm   LV E/e' lateral: 15.0 LV SV:         96 LV SV Index:   49 LVOT Area:     3.14 cm   RIGHT VENTRICLE             IVC RV S prime:     12.10 cm/s  IVC diam: 1.10 cm TAPSE (M-mode): 1.6 cm RVSP:           23.4 mmHg  LEFT ATRIUM             Index        RIGHT ATRIUM           Index LA diam:        3.40 cm 1.72 cm/m   RA Pressure: 3.00 mmHg LA Vol (A2C):   46.3 ml 23.47 ml/m  RA Area:     14.60 cm LA Vol (A4C):   42.5 ml 21.55 ml/m  RA Volume:   38.20 ml  19.37 ml/m LA Biplane Vol: 44.5 ml 22.56 ml/m AORTIC VALVE AV Area (Vmax):    1.01 cm AV Area (Vmean):   0.93 cm AV Area (VTI):     0.98 cm AV Vmax:           425.00 cm/s AV Vmean:          304.500 cm/s AV VTI:            0.980 m AV Peak Grad:      72.2  mmHg AV Mean Grad:      42.0 mmHg LVOT Vmax:         137.00 cm/s LVOT Vmean:        90.100 cm/s LVOT VTI:          0.307 m LVOT/AV VTI ratio: 0.31  AORTA Ao Root diam: 2.90 cm Ao Asc diam:  3.90 cm  MITRAL VALVE                TRICUSPID VALVE MV Area (PHT): 3.06 cm     TR Peak grad:   20.4 mmHg MV Decel Time: 248 msec     TR Vmax:        226.00 cm/s MV E velocity: 103.00 cm/s  Estimated RAP:  3.00 mmHg MV A velocity: 118.00 cm/s  RVSP:           23.4 mmHg MV E/A ratio:  0.87 SHUNTS Systemic VTI:  0.31 m Systemic Diam: 2.00 cm  Donato Schultz MD Electronically signed by Donato Schultz MD Signature Date/Time: 09/20/2022/12:50:53  PM    Final     CT SCANS  CT CARDIAC SCORING (SELF PAY ONLY) 01/24/2018  Narrative CLINICAL DATA:  Elevated cholesterol  EXAM: CT HEART FOR CALCIUM SCORING  TECHNIQUE: CT heart was performed on a 64 channel system using prospective ECG gating.  A non-contrast exam for calcium scoring was performed.  Note that this exam targets the heart and the chest was not imaged in its entirety.  COMPARISON:  None.  FINDINGS: Technical quality: Good.  CORONARY CALCIUM  Total Agatston Score: 505 with extensive calcifications throughout the left anterior descending coronary artery. Calcifications also noted in the proximal left circumflex and in the mid RCA  MESA database percentile:  81  OTHER FINDINGS:  Cardiovascular: Heart is normal size. Aorta is normal caliber. Scattered aortic calcifications.  Mediastinum/Nodes: No adenopathy in the lower mediastinum or hila.  Lungs/Pleura: Peripheral interstitial prominence compatible with scarring in the lung bases. No effusions or confluent opacities.  Upper Abdomen: Imaging into the upper abdomen shows no acute findings.  Musculoskeletal: Chest wall soft tissues are unremarkable. No acute bony abnormality.  IMPRESSION: The observed calcium score of 505 is at the percentile 81 for subjects of the same age, gender and race/ethnicity who are free of clinical cardiovascular disease and treated diabetes.  No acute extra cardiac abnormality.  Aortic atherosclerosis.   Electronically Signed By: Charlett Nose M.D. On: 01/24/2018 11:56          EKG:        Recent Labs: No results found for requested labs within last 365 days.  Recent Lipid Panel    Component Value Date/Time   CHOL 160 09/15/2020 1041   TRIG 169 (H) 09/15/2020 1041   HDL 59 09/15/2020 1041   CHOLHDL 2.7 09/15/2020 1041   LDLCALC 73 09/15/2020 1041     Risk Assessment/Calculations:                Physical Exam:    VS:  BP 134/78   Pulse  74   Ht 5\' 4"  (1.626 m)   Wt 208 lb 12.8 oz (94.7 kg)   SpO2 94%   BMI 35.84 kg/m     Wt Readings from Last 3 Encounters:  11/28/22 208 lb 12.8 oz (94.7 kg)  11/16/22 212 lb (96.2 kg)  11/02/22 212 lb (96.2 kg)     GEN:  Well nourished, well developed in no acute distress HEENT: Normal NECK: No JVD; No carotid  bruits LYMPHATICS: No lymphadenopathy CARDIAC: RRR, 3/6 harsh late peaking crescendo decrescendo murmur at the right upper sternal border with absent A2 RESPIRATORY:  Clear to auscultation without rales, wheezing or rhonchi  ABDOMEN: Soft, non-tender, non-distended MUSCULOSKELETAL:  No edema; No deformity  SKIN: Warm and dry NEUROLOGIC:  Alert and oriented x 3 PSYCHIATRIC:  Normal affect   Assessment & Plan Pre-procedural cardiovascular examination We will draw labs today with plans for coronary angiography.  See below for further discussion. Nonrheumatic aortic valve stenosis The patient has severe stage D1 aortic stenosis with symptoms of progressive dizziness and fatigue.  Her echo is personally reviewed and demonstrates severe calcification and restriction of the aortic valve leaflets.  Aortic valve morphology is difficult to determine whether she has a tricuspid or bicuspid valve.  LVEF is 65%, mean gradient is 42 mmHg with a calculated valve area of 0.98 cm.  Aortic valve replacement is clearly indicated. I have reviewed the natural history of aortic stenosis with the patient and their family members who are present today. We have discussed the limitations of medical therapy and the poor prognosis associated with symptomatic aortic stenosis. We have reviewed potential treatment options, including palliative medical therapy, conventional surgical aortic valve replacement, and transcatheter aortic valve replacement. We discussed treatment options in the context of the patient's specific comorbid medical conditions.  The patient would like to move forward with treatment.  She  will be scheduled for a CTA of the chest, abdomen, and pelvis along with a gated CTA of the heart to better define her anatomy, size her aortic valve annulus, and determine whether she is a candidate for transfemoral TAVR.  She will be referred for cardiac catheterization to evaluate her coronary anatomy and make sure that she does not have severe coronary artery disease.  I do not think she needs right heart catheterization performed. I have reviewed the risks, indications, and alternatives to cardiac catheterization, possible angioplasty, and stenting with the patient. Risks include but are not limited to bleeding, infection, vascular injury, stroke, myocardial infection, arrhythmia, kidney injury, radiation-related injury in the case of prolonged fluoroscopy use, emergency cardiac surgery, and death. The patient understands the risks of serious complication is 1-2 in 1000 with diagnostic cardiac cath and 1-2% or less with angioplasty/stenting.  Once her studies are completed, she will be referred for formal cardiac surgical consultation as part of a multidisciplinary heart team evaluation.  I think she would likely be a good candidate for TAVR considering her advanced age and functional limitation from rheumatoid arthritis.       Informed Consent   Shared Decision Making/Informed Consent The risks [stroke (1 in 1000), death (1 in 1000), kidney failure [usually temporary] (1 in 500), bleeding (1 in 200), allergic reaction [possibly serious] (1 in 200)], benefits (diagnostic support and management of coronary artery disease) and alternatives of a cardiac catheterization were discussed in detail with Ms. Bellar and she is willing to proceed.       Medication Adjustments/Labs and Tests Ordered: Current medicines are reviewed at length with the patient today.  Concerns regarding medicines are outlined above.  Orders Placed This Encounter  Procedures   CBC   Basic metabolic panel   No orders of the  defined types were placed in this encounter.   Patient Instructions  Lab Work: CBC, BMET today If you have labs (blood work) drawn today and your tests are completely normal, you will receive your results only by: MyChart Message (if you have MyChart) OR  A paper copy in the mail If you have any lab test that is abnormal or we need to change your treatment, we will call you to review the results.  Testing/Procedures: R & L Heart Catheterization Your physician has requested that you have a cardiac catheterization. Cardiac catheterization is used to diagnose and/or treat various heart conditions. Doctors may recommend this procedure for a number of different reasons. The most common reason is to evaluate chest pain. Chest pain can be a symptom of coronary artery disease (CAD), and cardiac catheterization can show whether plaque is narrowing or blocking your heart's arteries. This procedure is also used to evaluate the valves, as well as measure the blood flow and oxygen levels in different parts of your heart. For further information please visit [email or web address] Please follow instruction sheet, as given.  TAVR CT's (you will be called to schedule)  Ambulatory referral to TCTS (you will be called to schedule)  Follow-Up: At [facility], you and your health needs are our priority.  As part of our continuing mission to provide you with exceptional heart care, we have created designated Provider Care Teams.  These Care Teams include your primary Cardiologist (physician) and Advanced Practice Providers (APPs -  Physician Assistants and Nurse Practitioners) who all work together to provide you with the care you need, when you need it.  Your next appointment:   Structural Team will follow-up  Provider:   Tonny Bollman, MD    [facility] A DEPT OF [place]. [facility] AT [facility] [address] [place], [place] [address] [place]  [address] Dept: [phone] Loc: [phone]  [name]  11/28/2022  You are scheduled for a Cardiac Catheterization on Thursday, October 31 with Dr. Tonny Bollman.  1. Please arrive at the [facility] (Main Entrance A) at [facility]: [address] [place], [place] [address] at 10:00 AM (This time is two hour(s) before your procedure to ensure your preparation). Free valet parking service is available. You will check in at ADMITTING. The support person will be asked to wait in the waiting room.  It is OK to have someone drop you off and come back when you are ready to be discharged.   Special note: Every effort is made to have your procedure done on time. Please understand that emergencies sometimes delay scheduled procedures. 2. Diet: Do not eat solid foods after midnight.  The patient may have clear liquids until 5am upon the day of the procedure. 3. Labs: TODAY 4. Medication instructions in preparation for your procedure:  Contrast Allergy: No On the morning of your procedure, take Aspirin 81 mg and any morning medicines NOT listed above.  You may use sips of water. 5. Plan to go home the same day, you will only stay overnight if medically necessary. 6. Bring a current list of your medications and current insurance cards. 7. You MUST have a responsible person to drive you home. 8. Someone MUST be with you the first 24 hours after you arrive home or your discharge will be delayed. 9. Please wear clothes that are easy to get on and off and wear slip-on shoes. Thank you for allowing [place] to care for you!   -- [facility] Invasive Cardiovascular services    Signed, Tonny Bollman, MD  11/28/2022 5:12 PM    [facility]

## 2022-11-28 NOTE — Patient Instructions (Signed)
Lab Work: CBC, BMET today If you have labs (blood work) drawn today and your tests are completely normal, you will receive your results only by: MyChart Message (if you have MyChart) OR A paper copy in the mail If you have any lab test that is abnormal or we need to change your treatment, we will call you to review the results.  Testing/Procedures: R & L Heart Catheterization Your physician has requested that you have a cardiac catheterization. Cardiac catheterization is used to diagnose and/or treat various heart conditions. Doctors may recommend this procedure for a number of different reasons. The most common reason is to evaluate chest pain. Chest pain can be a symptom of coronary artery disease (CAD), and cardiac catheterization can show whether plaque is narrowing or blocking your heart's arteries. This procedure is also used to evaluate the valves, as well as measure the blood flow and oxygen levels in different parts of your heart. For further information please visit [email or web address] Please follow instruction sheet, as given.  TAVR CT's (you will be called to schedule)  Ambulatory referral to TCTS (you will be called to schedule)  Follow-Up: At [facility], you and your health needs are our priority.  As part of our continuing mission to provide you with exceptional heart care, we have created designated Provider Care Teams.  These Care Teams include your primary Cardiologist (physician) and Advanced Practice Providers (APPs -  Physician Assistants and Nurse Practitioners) who all work together to provide you with the care you need, when you need it.  Your next appointment:   Structural Team will follow-up  Provider:   Tonny Bollman, MD    [facility] A DEPT OF [place]. [facility] AT [facility] [address] [place], [place] [address] [place] [address] Dept: [phone] Loc: [phone]  MARGEAUX MASSIE  11/28/2022  You are scheduled for a Cardiac Catheterization on Thursday, October 31 with Dr. Tonny Bollman.  1. Please arrive at the [facility] (Main Entrance A) at [facility]: [address] [place], [place] [address] at 10:00 AM (This time is two hour(s) before your procedure to ensure your preparation). Free valet parking service is available. You will check in at ADMITTING. The support person will be asked to wait in the waiting room.  It is OK to have someone drop you off and come back when you are ready to be discharged.   Special note: Every effort is made to have your procedure done on time. Please understand that emergencies sometimes delay scheduled procedures. 2. Diet: Do not eat solid foods after midnight.  The patient may have clear liquids until 5am upon the day of the procedure. 3. Labs: TODAY 4. Medication instructions in preparation for your procedure:  Contrast Allergy: No On the morning of your procedure, take Aspirin 81 mg and any morning medicines NOT listed above.  You may use sips of water. 5. Plan to go home the same day, you will only stay overnight if medically necessary. 6. Bring a current list of your medications and current insurance cards. 7. You MUST have a responsible person to drive you home. 8. Someone MUST be with you the first 24 hours after you arrive home or your discharge will be delayed. 9. Please wear clothes that are easy to get on and off and wear slip-on shoes. Thank you for allowing [place] to care for you!   -- [place] [facility]

## 2022-11-28 NOTE — Progress Notes (Addendum)
Pre Surgical Assessment: 5 M Walk Test  74M=16.21ft  5 Meter Walk Test- trial 1: 20.64 seconds 5 Meter Walk Test- trial 2: 17.50 seconds 5 Meter Walk Test- trial 3: 17.88 seconds 5 Meter Walk Test Average: 18.67 seconds  __________________________  Procedure Type: Isolated AVR Perioperative Outcome Estimate % Operative Mortality 4.89% Morbidity & Mortality 9.62% Stroke 1.21% Renal Failure 1.17% Reoperation 3.18% Prolonged Ventilation 4.81% Deep Sternal Wound Infection 0.068% Long Hospital Stay (>14 days) 4.86% Short Hospital Stay (<6 days)* 32.5%

## 2022-11-28 NOTE — H&P (View-Only) (Signed)
Cardiology Office Note:    Date:  11/28/2022   ID:  [name], DOB 22-Sep-1935, MRN [id number]  PCP:  Katherine Ran, MD   [facility] Providers Cardiologist:  Katherine Guadalajara, MD     Referring MD: Katherine Ran, MD   Chief Complaint  Patient presents with   Aortic Stenosis    History of Present Illness:    [name] is a 87 y.o. female presents for evaluation of severe aortic stenosis, referred by Katherine Fabian, NP and Dr. Tresa Ellis.  The patient has been followed for a heart murmur for many years.  She was initially noted to have mild aortic stenosis several years ago and then was ultimately referred for cardiology consultation with Dr. Tresa Ellis as an outpatient.  She has had annual echo studies the past few years.  She has no history of rheumatic fever, family history of aortic stenosis, or known history of bicuspid aortic valve disease.  In reviewing her old echo studies, she had aortic sclerosis in 2014, mild aortic stenosis in 2019, then developed moderate aortic stenosis in 2022 with a mean gradient of 28 mmHg, increasing the following year to a mean gradient of 32 mmHg, and on her most recent echo study she is found to have normal LVEF of 60 to 65%, peak transaortic velocity of 4.25 m/s, mean gradient of 42 mmHg, and calculated aortic valve area of 0.98 cm.  She is here with her son today. Her biggest complaint is dizziness. She feels dizzy when she first gets up, turns her head too quickly, and sometimes she is dizzy with walking. States that she's walking slower than in the past. She denies shortness of breath with exertion. Sometimes she has an 'ache' in her left chest.  This is not consistently related to physical exertion.  She has not had frank syncope.  She does complain of progressive fatigue and feels like this is likely related to her heart disease.  She denies orthopnea or PND.  She has occasional leg swelling and notes that she is nonadherent to a low-sodium  diet.    The patient has 2 grown sons, accompanied today by her son who works in the [employer].  She has had some problems with her teeth, but has regular dental care every 4 months and reports no current issues.  The patient is limited by rheumatoid arthritis.  She has had both knees replaced and has arthritis that impacts her hands.  She ambulates with a walker.  She has not been driving over the past few months because of her dizziness.  She otherwise is functionally independent.   Current Medications: Current Meds  Medication Sig   acetaminophen (TYLENOL) 500 MG tablet Take 1,000 mg by mouth every 6 (six) hours as needed. For headache   diphenhydrAMINE (BENADRYL ALLERGY) 25 MG tablet Take 1/2 tablet po 2x a day Oral   diphenhydrAMINE (BENADRYL) 50 MG/ML injection 50 mg Injection with infusions   estrogens, conjugated, (PREMARIN) 0.3 MG tablet Take 0.3 mg by mouth daily with breakfast.    folic acid (FOLVITE) 1 MG tablet Take 1 mg by mouth daily with breakfast.   Methotrexate Sodium (METHOTREXATE PO) Take 2.5 mg by mouth. 3 tablets on Saturday and 3 tablets on Sunday   Multiple Vitamins-Minerals (PRESERVISION AREDS 2 PO) Take by mouth 2 (two) times daily.   Propylene Glycol (SYSTANE BALANCE OP) Apply 1 drop to eye as needed.   psyllium (METAMUCIL) 58.6 % packet Take 1 packet  by mouth daily.   RESTASIS 0.05 % ophthalmic emulsion Place 1 drop into both eyes 2 (two) times daily.   rosuvastatin (CRESTOR) 20 MG tablet Take 20 mg by mouth 3 (three) times a week.   saccharomyces boulardii (FLORASTOR) 250 MG capsule Take 250 mg by mouth 2 (two) times daily.   tocilizumab (ACTEMRA) 400 MG/20ML SOLN injection Inject into the vein once.   traMADol (ULTRAM) 50 MG tablet Take 1-2 tablets (50-100 mg total) by mouth every 6 (six) hours as needed.   vitamin C (ASCORBIC ACID) 500 MG tablet Take 500 mg by mouth as needed.    VITAMIN D, ERGOCALCIFEROL, PO Take 10,000 Units by  mouth 3 (three) times a week. On Wednesdays     Allergies:   Atorvastatin, Morphine and codeine, and Sulfa antibiotics   ROS:   Please see the history of present illness.    All other systems reviewed and are negative.  EKGs/Labs/Other Studies Reviewed:    The following studies were reviewed today: Cardiac Studies & Procedures       ECHOCARDIOGRAM  ECHOCARDIOGRAM COMPLETE 09/20/2022  Narrative ECHOCARDIOGRAM REPORT    Patient Name:   [name] Date of Exam: 09/20/2022 Medical Rec #:  [id number]        Height:       63.0 in Accession #:    [id number]       Weight:       209.6 lb Date of Birth:  1935-02-12       BSA:          1.973 m Patient Age:    86 years         BP:           128/82 mmHg Patient Gender: F                HR:           77 bpm. Exam Location:  [address]  Procedure: 2D Echo, Cardiac Doppler and Color Doppler  Indications:    I35.0 AS  History:        Patient has prior history of Echocardiogram examinations, most recent 09/07/2021. AS; Risk Factors:Former Smoker and Dyslipidemia.  Sonographer:    Samule Ohm RDCS Referring Phys: [phone] [name]  IMPRESSIONS   1. Aortic stenosis is now severe. 2. Left ventricular ejection fraction, by estimation, is 60 to 65%. The left ventricle has normal function. The left ventricle has no regional wall motion abnormalities. There is moderate left ventricular hypertrophy. Left ventricular diastolic parameters are consistent with Grade I diastolic dysfunction (impaired relaxation). 3. Right ventricular systolic function is normal. The right ventricular size is normal. 4. The mitral valve is normal in structure. Trivial mitral valve regurgitation. No evidence of mitral stenosis. 5. The aortic valve is calcified. There is severe calcifcation of the aortic valve. There is severe thickening of the aortic valve. Aortic valve regurgitation is not visualized. Severe aortic valve stenosis. Aortic valve area, by  VTI measures 0.98 cm. Aortic valve mean gradient measures 42.0 mmHg. Aortic valve Vmax measures 4.25 m/s. 6. The inferior vena cava is normal in size with greater than 50% respiratory variability, suggesting right atrial pressure of 3 mmHg.  Comparison(s): Prior images reviewed side by side. Changes from prior study are noted. 09/07/2021 - mean AV gradient 32 mmHg.  FINDINGS Left Ventricle: Left ventricular ejection fraction, by estimation, is 60 to 65%. The left ventricle has normal function. The left ventricle has no regional wall motion  abnormalities. The left ventricular internal cavity size was normal in size. There is moderate left ventricular hypertrophy. Left ventricular diastolic parameters are consistent with Grade I diastolic dysfunction (impaired relaxation).  Right Ventricle: The right ventricular size is normal. No increase in right ventricular wall thickness. Right ventricular systolic function is normal.  Left Atrium: Left atrial size was normal in size.  Right Atrium: Right atrial size was normal in size.  Pericardium: There is no evidence of pericardial effusion.  Mitral Valve: The mitral valve is normal in structure. Mild mitral annular calcification. Trivial mitral valve regurgitation. No evidence of mitral valve stenosis.  Tricuspid Valve: The tricuspid valve is normal in structure. Tricuspid valve regurgitation is not demonstrated. No evidence of tricuspid stenosis.  Aortic Valve: The aortic valve is calcified. There is severe calcifcation of the aortic valve. There is severe thickening of the aortic valve. Aortic valve regurgitation is not visualized. Severe aortic stenosis is present. Aortic valve mean gradient measures 42.0 mmHg. Aortic valve peak gradient measures 72.2 mmHg. Aortic valve area, by VTI measures 0.98 cm.  Pulmonic Valve: The pulmonic valve was normal in structure. Pulmonic valve regurgitation is trivial. No evidence of pulmonic stenosis.  Aorta: The  aortic root is normal in size and structure.  Venous: The inferior vena cava is normal in size with greater than 50% respiratory variability, suggesting right atrial pressure of 3 mmHg.  IAS/Shunts: No atrial level shunt detected by color flow Doppler.  Additional Comments: Aortic stenosis is now severe.   LEFT VENTRICLE PLAX 2D LVIDd:         4.40 cm   Diastology LVIDs:         2.80 cm   LV e' medial:    9.90 cm/s LV PW:         1.20 cm   LV E/e' medial:  10.4 LV IVS:        1.40 cm   LV e' lateral:   6.85 cm/s LVOT diam:     2.00 cm   LV E/e' lateral: 15.0 LV SV:         96 LV SV Index:   49 LVOT Area:     3.14 cm   RIGHT VENTRICLE             IVC RV S prime:     12.10 cm/s  IVC diam: 1.10 cm TAPSE (M-mode): 1.6 cm RVSP:           23.4 mmHg  LEFT ATRIUM             Index        RIGHT ATRIUM           Index LA diam:        3.40 cm 1.72 cm/m   RA Pressure: 3.00 mmHg LA Vol (A2C):   46.3 ml 23.47 ml/m  RA Area:     14.60 cm LA Vol (A4C):   42.5 ml 21.55 ml/m  RA Volume:   38.20 ml  19.37 ml/m LA Biplane Vol: 44.5 ml 22.56 ml/m AORTIC VALVE AV Area (Vmax):    1.01 cm AV Area (Vmean):   0.93 cm AV Area (VTI):     0.98 cm AV Vmax:           425.00 cm/s AV Vmean:          304.500 cm/s AV VTI:            0.980 m AV Peak Grad:      72.2  mmHg AV Mean Grad:      42.0 mmHg LVOT Vmax:         137.00 cm/s LVOT Vmean:        90.100 cm/s LVOT VTI:          0.307 m LVOT/AV VTI ratio: 0.31  AORTA Ao Root diam: 2.90 cm Ao Asc diam:  3.90 cm  MITRAL VALVE                TRICUSPID VALVE MV Area (PHT): 3.06 cm     TR Peak grad:   20.4 mmHg MV Decel Time: 248 msec     TR Vmax:        226.00 cm/s MV E velocity: 103.00 cm/s  Estimated RAP:  3.00 mmHg MV A velocity: 118.00 cm/s  RVSP:           23.4 mmHg MV E/A ratio:  0.87 SHUNTS Systemic VTI:  0.31 m Systemic Diam: 2.00 cm  Donato Schultz MD Electronically signed by Donato Schultz MD Signature Date/Time: 09/20/2022/12:50:53  PM    Final     CT SCANS  CT CARDIAC SCORING (SELF PAY ONLY) 01/24/2018  Narrative CLINICAL DATA:  Elevated cholesterol  EXAM: CT HEART FOR CALCIUM SCORING  TECHNIQUE: CT heart was performed on a 64 channel system using prospective ECG gating.  A non-contrast exam for calcium scoring was performed.  Note that this exam targets the heart and the chest was not imaged in its entirety.  COMPARISON:  None.  FINDINGS: Technical quality: Good.  CORONARY CALCIUM  Total Agatston Score: 505 with extensive calcifications throughout the left anterior descending coronary artery. Calcifications also noted in the proximal left circumflex and in the mid RCA  MESA database percentile:  81  OTHER FINDINGS:  Cardiovascular: Heart is normal size. Aorta is normal caliber. Scattered aortic calcifications.  Mediastinum/Nodes: No adenopathy in the lower mediastinum or hila.  Lungs/Pleura: Peripheral interstitial prominence compatible with scarring in the lung bases. No effusions or confluent opacities.  Upper Abdomen: Imaging into the upper abdomen shows no acute findings.  Musculoskeletal: Chest wall soft tissues are unremarkable. No acute bony abnormality.  IMPRESSION: The observed calcium score of 505 is at the percentile 81 for subjects of the same age, gender and race/ethnicity who are free of clinical cardiovascular disease and treated diabetes.  No acute extra cardiac abnormality.  Aortic atherosclerosis.   Electronically Signed By: Charlett Nose M.D. On: 01/24/2018 11:56          EKG:        Recent Labs: No results found for requested labs within last 365 days.  Recent Lipid Panel    Component Value Date/Time   CHOL 160 09/15/2020 1041   TRIG 169 (H) 09/15/2020 1041   HDL 59 09/15/2020 1041   CHOLHDL 2.7 09/15/2020 1041   LDLCALC 73 09/15/2020 1041     Risk Assessment/Calculations:                Physical Exam:    VS:  BP 134/78   Pulse  74   Ht 5\' 4"  (1.626 m)   Wt 208 lb 12.8 oz (94.7 kg)   SpO2 94%   BMI 35.84 kg/m     Wt Readings from Last 3 Encounters:  11/28/22 208 lb 12.8 oz (94.7 kg)  11/16/22 212 lb (96.2 kg)  11/02/22 212 lb (96.2 kg)     GEN:  Well nourished, well developed in no acute distress HEENT: Normal NECK: No JVD; No carotid  bruits LYMPHATICS: No lymphadenopathy CARDIAC: RRR, 3/6 harsh late peaking crescendo decrescendo murmur at the right upper sternal border with absent A2 RESPIRATORY:  Clear to auscultation without rales, wheezing or rhonchi  ABDOMEN: Soft, non-tender, non-distended MUSCULOSKELETAL:  No edema; No deformity  SKIN: Warm and dry NEUROLOGIC:  Alert and oriented x 3 PSYCHIATRIC:  Normal affect   Assessment & Plan Pre-procedural cardiovascular examination We will draw labs today with plans for coronary angiography.  See below for further discussion. Nonrheumatic aortic valve stenosis The patient has severe stage D1 aortic stenosis with symptoms of progressive dizziness and fatigue.  Her echo is personally reviewed and demonstrates severe calcification and restriction of the aortic valve leaflets.  Aortic valve morphology is difficult to determine whether she has a tricuspid or bicuspid valve.  LVEF is 65%, mean gradient is 42 mmHg with a calculated valve area of 0.98 cm.  Aortic valve replacement is clearly indicated. I have reviewed the natural history of aortic stenosis with the patient and their family members who are present today. We have discussed the limitations of medical therapy and the poor prognosis associated with symptomatic aortic stenosis. We have reviewed potential treatment options, including palliative medical therapy, conventional surgical aortic valve replacement, and transcatheter aortic valve replacement. We discussed treatment options in the context of the patient's specific comorbid medical conditions.  The patient would like to move forward with treatment.  She  will be scheduled for a CTA of the chest, abdomen, and pelvis along with a gated CTA of the heart to better define her anatomy, size her aortic valve annulus, and determine whether she is a candidate for transfemoral TAVR.  She will be referred for cardiac catheterization to evaluate her coronary anatomy and make sure that she does not have severe coronary artery disease.  I do not think she needs right heart catheterization performed. I have reviewed the risks, indications, and alternatives to cardiac catheterization, possible angioplasty, and stenting with the patient. Risks include but are not limited to bleeding, infection, vascular injury, stroke, myocardial infection, arrhythmia, kidney injury, radiation-related injury in the case of prolonged fluoroscopy use, emergency cardiac surgery, and death. The patient understands the risks of serious complication is 1-2 in 1000 with diagnostic cardiac cath and 1-2% or less with angioplasty/stenting.  Once her studies are completed, she will be referred for formal cardiac surgical consultation as part of a multidisciplinary heart team evaluation.  I think she would likely be a good candidate for TAVR considering her advanced age and functional limitation from rheumatoid arthritis.       Informed Consent   Shared Decision Making/Informed Consent The risks [stroke (1 in 1000), death (1 in 1000), kidney failure [usually temporary] (1 in 500), bleeding (1 in 200), allergic reaction [possibly serious] (1 in 200)], benefits (diagnostic support and management of coronary artery disease) and alternatives of a cardiac catheterization were discussed in detail with Ms. Bellar and she is willing to proceed.       Medication Adjustments/Labs and Tests Ordered: Current medicines are reviewed at length with the patient today.  Concerns regarding medicines are outlined above.  Orders Placed This Encounter  Procedures   CBC   Basic metabolic panel   No orders of the  defined types were placed in this encounter.   Patient Instructions  Lab Work: CBC, BMET today If you have labs (blood work) drawn today and your tests are completely normal, you will receive your results only by: MyChart Message (if you have MyChart) OR  A paper copy in the mail If you have any lab test that is abnormal or we need to change your treatment, we will call you to review the results.  Testing/Procedures: R & L Heart Catheterization Your physician has requested that you have a cardiac catheterization. Cardiac catheterization is used to diagnose and/or treat various heart conditions. Doctors may recommend this procedure for a number of different reasons. The most common reason is to evaluate chest pain. Chest pain can be a symptom of coronary artery disease (CAD), and cardiac catheterization can show whether plaque is narrowing or blocking your heart's arteries. This procedure is also used to evaluate the valves, as well as measure the blood flow and oxygen levels in different parts of your heart. For further information please visit [email or web address] Please follow instruction sheet, as given.  TAVR CT's (you will be called to schedule)  Ambulatory referral to TCTS (you will be called to schedule)  Follow-Up: At [facility], you and your health needs are our priority.  As part of our continuing mission to provide you with exceptional heart care, we have created designated Provider Care Teams.  These Care Teams include your primary Cardiologist (physician) and Advanced Practice Providers (APPs -  Physician Assistants and Nurse Practitioners) who all work together to provide you with the care you need, when you need it.  Your next appointment:   Structural Team will follow-up  Provider:   Tonny Bollman, MD    [facility] A DEPT OF [facility]. [facility] AT [facility] [address] [place], [place] [address] [place]  [address] Dept: [phone] Loc: [phone]  TYAUNA EDMUNDS  11/28/2022  You are scheduled for a Cardiac Catheterization on Thursday, October 31 with Dr. Tonny Bollman.  1. Please arrive at the [facility] (Main Entrance A) at [facility]: [address] [place], [place] [address] at 10:00 AM (This time is two hour(s) before your procedure to ensure your preparation). Free valet parking service is available. You will check in at ADMITTING. The support person will be asked to wait in the waiting room.  It is OK to have someone drop you off and come back when you are ready to be discharged.   Special note: Every effort is made to have your procedure done on time. Please understand that emergencies sometimes delay scheduled procedures. 2. Diet: Do not eat solid foods after midnight.  The patient may have clear liquids until 5am upon the day of the procedure. 3. Labs: TODAY 4. Medication instructions in preparation for your procedure:  Contrast Allergy: No On the morning of your procedure, take Aspirin 81 mg and any morning medicines NOT listed above.  You may use sips of water. 5. Plan to go home the same day, you will only stay overnight if medically necessary. 6. Bring a current list of your medications and current insurance cards. 7. You MUST have a responsible person to drive you home. 8. Someone MUST be with you the first 24 hours after you arrive home or your discharge will be delayed. 9. Please wear clothes that are easy to get on and off and wear slip-on shoes. Thank you for allowing [place] to care for you!   -- [facility] Invasive Cardiovascular services    Signed, Tonny Bollman, MD  11/28/2022 5:12 PM    [facility]

## 2022-11-29 LAB — BASIC METABOLIC PANEL
BUN/Creatinine Ratio: 23 (ref 12–28)
BUN: 14 mg/dL (ref 8–27)
CO2: 25 mmol/L (ref 20–29)
Calcium: 9.3 mg/dL (ref 8.7–10.3)
Chloride: 101 mmol/L (ref 96–106)
Creatinine, Ser: 0.6 mg/dL (ref 0.57–1.00)
Glucose: 136 mg/dL — ABNORMAL HIGH (ref 70–99)
Potassium: 4.5 mmol/L (ref 3.5–5.2)
Sodium: 142 mmol/L (ref 134–144)
eGFR: 88 mL/min/{1.73_m2} (ref >59)

## 2022-12-07 ENCOUNTER — Other Ambulatory Visit: Payer: Self-pay

## 2022-12-07 ENCOUNTER — Telehealth: Payer: Self-pay | Admitting: *Deleted

## 2022-12-07 DIAGNOSIS — I350 Nonrheumatic aortic (valve) stenosis: Secondary | ICD-10-CM

## 2022-12-07 NOTE — Telephone Encounter (Signed)
Cardiac Catheterization scheduled at [facility] for: Thursday December 08, 2022 12 Noon Arrival time [facility] Main Entrance A at: 10 AM  Nothing to eat after midnight prior to procedure, clear liquids until 5 AM day of procedure.  Medication instructions: -Usual morning medications can be taken with sips of water including aspirin 81 mg.  Plan to go home the same day, you will only stay overnight if medically necessary.  You must have responsible adult to drive you home.  Someone must be with you the first 24 hours after you arrive home.  Reviewed procedure instructions with patient.

## 2022-12-08 ENCOUNTER — Other Ambulatory Visit: Payer: Self-pay

## 2022-12-08 ENCOUNTER — Encounter (HOSPITAL_COMMUNITY)
Admission: RE | Disposition: A | Discharge status: Home / Self Care | Payer: Self-pay | Source: Home / Self Care | Attending: Cardiovascular Disease

## 2022-12-08 ENCOUNTER — Ambulatory Visit (HOSPITAL_COMMUNITY)
Admission: RE | Admit: 2022-12-08 | Discharge: 2022-12-08 | Disposition: A | Discharge status: Home / Self Care | Payer: Medicare Other | Attending: Cardiovascular Disease | Admitting: Cardiovascular Disease

## 2022-12-08 ENCOUNTER — Encounter (HOSPITAL_COMMUNITY): Payer: Self-pay | Admitting: Cardiovascular Disease

## 2022-12-08 DIAGNOSIS — I2582 Chronic total occlusion of coronary artery: Secondary | ICD-10-CM | POA: Insufficient documentation

## 2022-12-08 DIAGNOSIS — Z01818 Encounter for other preprocedural examination: Secondary | ICD-10-CM | POA: Diagnosis not present

## 2022-12-08 DIAGNOSIS — I2584 Coronary atherosclerosis due to calcified coronary lesion: Secondary | ICD-10-CM | POA: Insufficient documentation

## 2022-12-08 DIAGNOSIS — I350 Nonrheumatic aortic (valve) stenosis: Secondary | ICD-10-CM | POA: Diagnosis not present

## 2022-12-08 DIAGNOSIS — I2510 Atherosclerotic heart disease of native coronary artery without angina pectoris: Secondary | ICD-10-CM | POA: Diagnosis not present

## 2022-12-08 DIAGNOSIS — M069 Rheumatoid arthritis, unspecified: Secondary | ICD-10-CM | POA: Diagnosis not present

## 2022-12-08 HISTORY — PX: LEFT HEART CATH AND CORONARY ANGIOGRAPHY: CATH118249

## 2022-12-08 SURGERY — LEFT HEART CATH AND CORONARY ANGIOGRAPHY
Anesthesia: LOCAL

## 2022-12-08 MED ORDER — SODIUM CHLORIDE 0.9 % IV SOLN: INTRAVENOUS | Status: DC

## 2022-12-08 MED ORDER — VERAPAMIL HCL 2.5 MG/ML IV SOLN
INTRAVENOUS | Status: DC | PRN
  Administered 2022-12-08: 10 mL via INTRA_ARTERIAL

## 2022-12-08 MED ORDER — HYDRALAZINE HCL 20 MG/ML IJ SOLN: 10.0000 mg | INTRAMUSCULAR | Status: DC | PRN

## 2022-12-08 MED ORDER — MIDAZOLAM HCL 2 MG/2ML IJ SOLN
INTRAMUSCULAR | Status: AC
  Filled 2022-12-08: qty 2

## 2022-12-08 MED ORDER — HEPARIN (PORCINE) IN NACL 1000-0.9 UT/500ML-% IV SOLN
INTRAVENOUS | Status: DC | PRN
  Administered 2022-12-08 (×2): 500 mL

## 2022-12-08 MED ORDER — ACETAMINOPHEN 325 MG PO TABS
650.0000 mg | ORAL_TABLET | ORAL | Status: DC | PRN
  Administered 2022-12-08: 650 mg via ORAL
  Filled 2022-12-08: qty 2

## 2022-12-08 MED ORDER — HEPARIN SODIUM (PORCINE) 1000 UNIT/ML IJ SOLN
INTRAMUSCULAR | Status: AC
  Filled 2022-12-08: qty 10

## 2022-12-08 MED ORDER — SODIUM CHLORIDE 0.9 % WEIGHT BASED INFUSION: 1.0000 mL/kg/h | INTRAVENOUS | Status: DC

## 2022-12-08 MED ORDER — FENTANYL CITRATE (PF) 100 MCG/2ML IJ SOLN
INTRAMUSCULAR | Status: DC | PRN
  Administered 2022-12-08: 50 ug via INTRAVENOUS
  Administered 2022-12-08: 25 ug via INTRAVENOUS

## 2022-12-08 MED ORDER — IOHEXOL 350 MG/ML SOLN
INTRAVENOUS | Status: DC | PRN
  Administered 2022-12-08: 30 mL

## 2022-12-08 MED ORDER — SODIUM CHLORIDE 0.9% FLUSH: 3.0000 mL | Freq: Two times a day (BID) | INTRAVENOUS | Status: DC

## 2022-12-08 MED ORDER — FENTANYL CITRATE (PF) 100 MCG/2ML IJ SOLN
INTRAMUSCULAR | Status: AC
  Filled 2022-12-08: qty 2

## 2022-12-08 MED ORDER — MIDAZOLAM HCL 2 MG/2ML IJ SOLN
INTRAMUSCULAR | Status: DC | PRN
  Administered 2022-12-08: 1 mg via INTRAVENOUS
  Administered 2022-12-08: 2 mg via INTRAVENOUS

## 2022-12-08 MED ORDER — LABETALOL HCL 5 MG/ML IV SOLN: 10.0000 mg | INTRAVENOUS | Status: DC | PRN

## 2022-12-08 MED ORDER — LIDOCAINE HCL (PF) 1 % IJ SOLN
INTRAMUSCULAR | Status: DC | PRN
  Administered 2022-12-08: 2 mL

## 2022-12-08 MED ORDER — ONDANSETRON HCL 4 MG/2ML IJ SOLN: 4.0000 mg | Freq: Four times a day (QID) | INTRAMUSCULAR | Status: DC | PRN

## 2022-12-08 MED ORDER — SODIUM CHLORIDE 0.9 % WEIGHT BASED INFUSION
3.0000 mL/kg/h | INTRAVENOUS | Status: AC
Start: 2022-12-08 — End: 2022-12-08
  Administered 2022-12-08: 3 mL/kg/h via INTRAVENOUS

## 2022-12-08 MED ORDER — SODIUM CHLORIDE 0.9 % IV SOLN: 250.0000 mL | INTRAVENOUS | Status: DC | PRN

## 2022-12-08 MED ORDER — ASPIRIN 81 MG PO CHEW: 81.0000 mg | CHEWABLE_TABLET | ORAL | Status: DC

## 2022-12-08 MED ORDER — SODIUM CHLORIDE 0.9% FLUSH: 3.0000 mL | INTRAVENOUS | Status: DC | PRN

## 2022-12-08 MED ORDER — LIDOCAINE HCL (PF) 1 % IJ SOLN
INTRAMUSCULAR | Status: AC
  Filled 2022-12-08: qty 30

## 2022-12-08 MED ORDER — HEPARIN SODIUM (PORCINE) 1000 UNIT/ML IJ SOLN
INTRAMUSCULAR | Status: DC | PRN
  Administered 2022-12-08: 5000 [IU] via INTRAVENOUS

## 2022-12-08 MED ORDER — VERAPAMIL HCL 2.5 MG/ML IV SOLN
INTRAVENOUS | Status: AC
  Filled 2022-12-08: qty 2

## 2022-12-08 SURGICAL SUPPLY — 10 items
CATH 5FR JL3.5 JR4 ANG PIG MP (CATHETERS) IMPLANT
DEVICE RAD COMP TR BAND LRG (VASCULAR PRODUCTS) IMPLANT
GLIDESHEATH SLEND SS 6F .021 (SHEATH) IMPLANT
GUIDEWIRE INQWIRE 1.5J.035X260 (WIRE) IMPLANT
GUIDEWIRE TIGER .035X300 (WIRE) IMPLANT
INQWIRE 1.5J .035X260CM (WIRE) ×1
PACK CARDIAC CATHETERIZATION (CUSTOM PROCEDURE TRAY) ×1 IMPLANT
SET ATX-X65L (MISCELLANEOUS) IMPLANT
SHEATH PROBE COVER 6X72 (BAG) IMPLANT
WIRE EMERALD 3MM-J .035X260CM (WIRE) IMPLANT

## 2022-12-08 NOTE — Interval H&P Note (Signed)
History and Physical Interval Note:  12/08/2022 12:01 PM  [name]  has presented today for surgery, with the diagnosis of aortic stenosis.  The various methods of treatment have been discussed with the patient and family. After consideration of risks, benefits and other options for treatment, the patient has consented to  Procedure(s): LEFT HEART CATH AND CORONARY ANGIOGRAPHY (N/A) as a surgical intervention.  The patient's history has been reviewed, patient examined, no change in status, stable for surgery.  I have reviewed the patient's chart and labs.  Questions were answered to the patient's satisfaction.     Tonny Bollman

## 2022-12-08 NOTE — Progress Notes (Signed)
TR band removed at 1545, gauze dressing applied. Right radial level0, clean, dry, and intact.

## 2022-12-08 NOTE — Discharge Instructions (Signed)

## 2022-12-12 ENCOUNTER — Ambulatory Visit (HOSPITAL_COMMUNITY)
Admission: RE | Admit: 2022-12-12 | Discharge: 2022-12-12 | Disposition: A | Discharge status: Home / Self Care | Payer: Medicare Other | Source: Ambulatory Visit | Attending: Internal Medicine | Admitting: Internal Medicine

## 2022-12-12 DIAGNOSIS — Z01818 Encounter for other preprocedural examination: Secondary | ICD-10-CM | POA: Diagnosis not present

## 2022-12-12 DIAGNOSIS — K429 Umbilical hernia without obstruction or gangrene: Secondary | ICD-10-CM | POA: Diagnosis not present

## 2022-12-12 DIAGNOSIS — I350 Nonrheumatic aortic (valve) stenosis: Secondary | ICD-10-CM | POA: Diagnosis not present

## 2022-12-12 DIAGNOSIS — K5730 Diverticulosis of large intestine without perforation or abscess without bleeding: Secondary | ICD-10-CM | POA: Diagnosis not present

## 2022-12-12 DIAGNOSIS — Z48812 Encounter for surgical aftercare following surgery on the circulatory system: Secondary | ICD-10-CM | POA: Diagnosis not present

## 2022-12-12 MED ORDER — IOHEXOL 350 MG/ML SOLN
100.0000 mL | Freq: Once | INTRAVENOUS | Status: AC | PRN
  Administered 2022-12-12: 100 mL via INTRAVENOUS

## 2022-12-13 ENCOUNTER — Encounter: Payer: Self-pay | Admitting: Physician Assistant

## 2022-12-13 ENCOUNTER — Other Ambulatory Visit: Payer: Self-pay | Admitting: Physician Assistant

## 2022-12-13 DIAGNOSIS — N289 Disorder of kidney and ureter, unspecified: Secondary | ICD-10-CM

## 2022-12-13 DIAGNOSIS — R93422 Abnormal radiologic findings on diagnostic imaging of left kidney: Secondary | ICD-10-CM

## 2022-12-13 DIAGNOSIS — M0579 Rheumatoid arthritis with rheumatoid factor of multiple sites without organ or systems involvement: Secondary | ICD-10-CM | POA: Diagnosis not present

## 2022-12-13 DIAGNOSIS — Z952 Presence of prosthetic heart valve: Secondary | ICD-10-CM

## 2022-12-13 DIAGNOSIS — R1900 Intra-abdominal and pelvic swelling, mass and lump, unspecified site: Secondary | ICD-10-CM

## 2022-12-13 DIAGNOSIS — Z79899 Other long term (current) drug therapy: Secondary | ICD-10-CM | POA: Diagnosis not present

## 2022-12-21 ENCOUNTER — Encounter: Payer: Self-pay | Admitting: Surgery

## 2022-12-21 ENCOUNTER — Institutional Professional Consult (permissible substitution) (INDEPENDENT_AMBULATORY_CARE_PROVIDER_SITE_OTHER): Payer: Medicare Other | Admitting: Surgery

## 2022-12-21 VITALS — BP 136/79 | HR 77 | Resp 18 | Ht 64.0 in | Wt 208.0 lb

## 2022-12-21 DIAGNOSIS — I350 Nonrheumatic aortic (valve) stenosis: Secondary | ICD-10-CM | POA: Diagnosis not present

## 2022-12-22 NOTE — Progress Notes (Signed)
Patient ID: [name], female   DOB: 1935/04/03, 87 y.o.   MRN: [id number]  [facility]       [address]       [place] [address]             [phone]          CARDIOTHORACIC SURGERY CONSULTATION REPORT  PCP is Rodrigo Ran, MD Referring Provider is Tonny Bollman, MD Primary Cardiologist is Nicki Guadalajara, MD  Reason for consultation:  Severe aortic stenosis  HPI:  The patient is an 87 year old woman with a history of hypertension, hyperlipidemia, hypothyroidism, rheumatoid arthritis status post bilateral knee replacements on methotrexate and Tocilizumab,  and aortic stenosis who has been followed with a heart murmur for many years with serial echocardiogram showing mild aortic stenosis in 2019, moderate aortic stenosis in 2022 with a mean gradient of 28 mmHg and increased to 32 mmHg in 2023.  Her most recent echocardiogram shows an increase in the mean gradient to 42 mmHg with a valve area of 0.98 cm.  Left ventricular ejection fraction is 60 to 65%.  She has developed frequent and recurrent episodes of dizziness particularly when she first gets up from laying down or sitting.  She has also been having episodes of dizziness with walking.  She moves slowly using a walker due to her rheumatoid arthritis and knee replacements.  She has noted some exertional shortness of breath and aching pain in the left side of her chest.  She occasionally has some lower extremity edema in her ankles usually after increased sodium intake.  She is here today with one of her sons and daughter-in-law.  Her activity is limited due to her rheumatoid arthritis as well as prior knee replacements.  She said that her right knee works fairly well but the left has been a persistent problem.  She was driving until the past few months when she started having more dizziness.    Past Medical History:  Diagnosis Date   Arthritis     rheumatoid   GERD (gastroesophageal reflux disease)    Hyperlipidemia    Hyperthyroidism    Hypothyroidism    Macular degeneration    Osteoporosis    PONV (postoperative nausea and vomiting)    none recently   Severe aortic stenosis    Shingles 04/21/2014   seen by Dr Waynard Edwards and treated with Valtrex    Past Surgical History:  Procedure Laterality Date   ABDOMINAL HYSTERECTOMY  1984   ovarys left in   BREAST BIOPSY Right 2018   CHOLECYSTECTOMY  2005   DILATION AND CURETTAGE OF UTERUS     JOINT REPLACEMENT  1990   right knee   KNEE RECONSTRUCTION Left 09/26/12   LEFT HEART CATH AND CORONARY ANGIOGRAPHY N/A 12/08/2022   Procedure: LEFT HEART CATH AND CORONARY ANGIOGRAPHY;  Surgeon: Tonny Bollman, MD;  Location: [facility] INVASIVE CV LAB;  Service: Cardiovascular;  Laterality: N/A;   left knee replacement  2012   x2 surgeries-ruptures patella tendon, fractured patella   PATELLAR TENDON REPAIR  07/21/2011   Procedure: PATELLA TENDON REPAIR;  Surgeon: Loanne Drilling, MD;  Location: [facility] ORS;  Service: Orthopedics;  Laterality: Left;  Left Knee Patella Tendon Reconstruction with allograft   QUADRICEPS TENDON REPAIR Left 09/26/2012   Procedure: LEFT EXTENSOR MECHANISM RECONSTRUCTION/WITH GRAFT;  Surgeon: Loanne Drilling, MD;  Location: [facility];  Service: Orthopedics;  Laterality: Left;   REPAIR EXTENSOR TENDON  Right 03/05/2015   Procedure: RECONSTRUCTION EXTENSOR HOOD RIGHT MIDDLE FINGER;  Surgeon: Cindee Salt, MD;  Location: [facility];  Service: Orthopedics;  Laterality: Right;  ANESTHESIA:  [facility]   TONSILLECTOMY     as child   wrist bilateral  [id number]   for arthritis    Family History  Problem Relation Age of Onset   Throat cancer Mother 12   Lung cancer Father 50   Arthritis/Rheumatoid Sister    Breast cancer Neg Hx     Social History   Socioeconomic History   Marital status: Divorced    Spouse name: Not on file   Number of children: 3   Years of  education: Not on file   Highest education level: Not on file  Occupational History   Not on file  Tobacco Use   Smoking status: Former    Current packs/day: 0.00    Average packs/day: 0.5 packs/day for 15.0 years (7.5 ttl pk-yrs)    Types: Cigarettes    Start date: 02/07/1990    Quit date: 02/07/2005    Years since quitting: 17.8   Smokeless tobacco: Never  Substance and Sexual Activity   Alcohol use: No   Drug use: No   Sexual activity: Not on file  Other Topics Concern   Not on file  Social History Narrative   Not on file   Social Determinants of Health   Financial Resource Strain: Not on file  Food Insecurity: Not on file  Transportation Needs: Not on file  Physical Activity: Not on file  Stress: Not on file  Social Connections: Not on file  Intimate Partner Violence: Not on file    Prior to Admission medications   Medication Sig Start Date End Date Taking? Authorizing Provider  acetaminophen (TYLENOL) 500 MG tablet Take 500 mg by mouth every 6 (six) hours as needed for headache.   Yes [provider]  Cholecalciferol (VITAMIN D3) 250 MCG (10000 UT) capsule Take 10,000 Units by mouth every Monday, Wednesday, and Friday.   Yes [provider]  diphenhydrAMINE (BENADRYL ALLERGY) 25 MG tablet Take 12.5 mg by mouth 2 (two) times daily. 04/21/14  Yes [provider]  diphenhydrAMINE (BENADRYL) 50 MG/ML injection 50 mg Injection with infusions   Yes [provider]  estrogens, conjugated, (PREMARIN) 0.3 MG tablet Take 0.3 mg by mouth daily with breakfast.    Yes [provider]  folic acid (FOLVITE) 1 MG tablet Take 1 mg by mouth daily with breakfast.   Yes [provider]  methotrexate (RHEUMATREX) 2.5 MG tablet Take 7.5 mg by mouth 2 (two) times a week. Caution:Chemotherapy. Protect from light. On Saturdays and Sundays   Yes [provider]  Multiple Vitamins-Minerals (PRESERVISION AREDS 2 PO) Take 1 capsule by mouth  2 (two) times daily.   Yes [provider]  OVER THE COUNTER MEDICATION Take 1 tablet by mouth daily. [employer] supplement   Yes [provider]  Propylene Glycol (SYSTANE BALANCE OP) Place 1 drop into both eyes 2 (two) times daily as needed (dry eyes).   Yes [provider]  Psyllium (METAMUCIL 3 IN 1 DAILY FIBER PO) Take 1-2 capsules by mouth daily.   Yes [provider]  RESTASIS 0.05 % ophthalmic emulsion Place 1 drop into both eyes 2 (two) times daily. 10/24/22  Yes [provider]  rosuvastatin (CRESTOR) 20 MG tablet Take 20 mg by mouth daily.   Yes [provider]  saccharomyces boulardii (  FLORASTOR) 250 MG capsule Take 250 mg by mouth every other day.   Yes [provider]  tocilizumab (ACTEMRA) 400 MG/20ML SOLN injection Inject into the vein every 30 (thirty) days.   Yes [provider]  traMADol (ULTRAM) 50 MG tablet Take 1-2 tablets (50-100 mg total) by mouth every 6 (six) hours as needed. Patient taking differently: Take 25 mg by mouth daily. 09/28/12  Yes [place], Hospital doctor, PA-C  vitamin C (ASCORBIC ACID) 500 MG tablet Take 500 mg by mouth every other day.   Yes [provider]    Current Outpatient Medications  Medication Sig Dispense Refill   acetaminophen (TYLENOL) 500 MG tablet Take 500 mg by mouth every 6 (six) hours as needed for headache.     Cholecalciferol (VITAMIN D3) 250 MCG (10000 UT) capsule Take 10,000 Units by mouth every Monday, Wednesday, and Friday.     diphenhydrAMINE (BENADRYL ALLERGY) 25 MG tablet Take 12.5 mg by mouth 2 (two) times daily.     diphenhydrAMINE (BENADRYL) 50 MG/ML injection 50 mg Injection with infusions     estrogens, conjugated, (PREMARIN) 0.3 MG tablet Take 0.3 mg by mouth daily with breakfast.      folic acid (FOLVITE) 1 MG tablet Take 1 mg by mouth daily with breakfast.     methotrexate (RHEUMATREX) 2.5 MG tablet Take 7.5 mg by mouth 2 (two) times a week.  Caution:Chemotherapy. Protect from light. On Saturdays and Sundays     Multiple Vitamins-Minerals (PRESERVISION AREDS 2 PO) Take 1 capsule by mouth 2 (two) times daily.     OVER THE COUNTER MEDICATION Take 1 tablet by mouth daily. [employer]o supplement     Propylene Glycol (SYSTANE BALANCE OP) Place 1 drop into both eyes 2 (two) times daily as needed (dry eyes).     Psyllium (METAMUCIL 3 IN 1 DAILY FIBER PO) Take 1-2 capsules by mouth daily.     RESTASIS 0.05 % ophthalmic emulsion Place 1 drop into both eyes 2 (two) times daily.     rosuvastatin (CRESTOR) 20 MG tablet Take 20 mg by mouth daily.     saccharomyces boulardii (FLORASTOR) 250 MG capsule Take 250 mg by mouth every other day.     tocilizumab (ACTEMRA) 400 MG/20ML SOLN injection Inject into the vein every 30 (thirty) days.     traMADol (ULTRAM) 50 MG tablet Take 1-2 tablets (50-100 mg total) by mouth every 6 (six) hours as needed. (Patient taking differently: Take 25 mg by mouth daily.) 80 tablet 1   vitamin C (ASCORBIC ACID) 500 MG tablet Take 500 mg by mouth every other day.     No current facility-administered medications for this visit.    Allergies  Allergen Reactions   Atorvastatin     hands hurt   Morphine And Codeine Other (See Comments)    "makes me queezy"   Sulfa Antibiotics Hives    Hives as child-age 57      Review of Systems:   General:  normal appetite, + decreased energy, no weight gain, no weight loss, no fever  Cardiac:  + occasional aching left chest pain with exertion, no chest pain at rest, + SOB with moderate exertion, no resting SOB, no PND, no orthopnea, no palpitations, no arrhythmia, no atrial fibrillation, + LE edema, + frequent dizzy spells, no syncope  Respiratory:  + exertional shortness of breath, no home oxygen, no productive cough, no dry cough, no bronchitis, no wheezing, no hemoptysis, no asthma, no pain with inspiration or cough, no  sleep apnea, no CPAP at night  GI:   no difficulty  swallowing, no reflux, no frequent heartburn, no hiatal hernia, no abdominal pain, no constipation, no diarrhea, no hematochezia, no hematemesis, no melena  GU:   no dysuria,  no frequency, no urinary tract infection, no hematuria, no kidney stones, no kidney disease  Vascular:  no pain suggestive of claudication, no pain in feet, no leg cramps, no varicose veins, no DVT, no non-healing foot ulcer  Neuro:   no stroke, no TIA's, no seizures, no headaches, no temporary blindness one eye,  no slurred speech, no peripheral neuropathy, no chronic pain, + instability of gait, no memory/cognitive dysfunction  Musculoskeletal: + arthritis - primarily involving the knees and hands, + joint swelling, + myalgias, + difficulty walking, + decreased mobility   Skin:   no rash, + itching, no skin infections, no pressure sores or ulcerations  Psych:   no anxiety, no depression, no nervousness, no unusual recent stress  Eyes:   no blurry vision, no floaters, no recent vision changes, + wears glasses   ENT:   no hearing loss, no loose or painful teeth, no dentures, last saw dentist 12/2022  Hematologic:  no easy bruising, no abnormal bleeding, no clotting disorder, no frequent epistaxis  Endocrine:  no diabetes, does not check CBG's at home     Physical Exam:   BP 136/79 (BP Location: Left Arm, Patient Position: Sitting)   Pulse 77   Resp 18   Ht 5\' 4"  (1.626 m)   Wt 208 lb (94.3 kg)   SpO2 94% Comment: RA  BMI 35.70 kg/m   General:  Elderly, frail-appearing  HEENT:  Unremarkable, NCAT, PERLA, EOMI  Neck:   no JVD, no bruits, no adenopathy   Chest:   clear to auscultation, symmetrical breath sounds, no wheezes, no rhonchi   CV:   RRR, 3/6 systolic murmur RSB, no diastolic murmur  Abdomen:  soft, non-tender, no masses   Extremities:  warm, well-perfused, pulses palpable at ankles, mild lower extremity edema  Rectal/GU  Deferred  Neuro:   Grossly non-focal and symmetrical throughout  Skin:   Clean and  dry, no rashes, no breakdown  Diagnostic Tests:  ECHOCARDIOGRAM REPORT       Patient Name:   LUCAS AURE Date of Exam: 09/20/2022  Medical Rec #:  [id number]        Height:       63.0 in  Accession #:    [id number]       Weight:       209.6 lb  Date of Birth:  1935/04/13       BSA:          1.973 m  Patient Age:    86 years         BP:           128/82 mmHg  Patient Gender: F                HR:           77 bpm.  Exam Location:  [address]   Procedure: 2D Echo, Cardiac Doppler and Color Doppler   Indications:    I35.0 AS    History:        Patient has prior history of Echocardiogram examinations,  most                 recent 09/07/2021. AS; Risk Factors:Former Smoker and  Dyslipidemia.    Sonographer:    Samule Ohm RDCS  Referring Phys: [phone] THOMAS A KELLY   IMPRESSIONS     1. Aortic stenosis is now severe.   2. Left ventricular ejection fraction, by estimation, is 60 to 65%. The  left ventricle has normal function. The left ventricle has no regional  wall motion abnormalities. There is moderate left ventricular hypertrophy.  Left ventricular diastolic  parameters are consistent with Grade I diastolic dysfunction (impaired  relaxation).   3. Right ventricular systolic function is normal. The right ventricular  size is normal.   4. The mitral valve is normal in structure. Trivial mitral valve  regurgitation. No evidence of mitral stenosis.   5. The aortic valve is calcified. There is severe calcifcation of the  aortic valve. There is severe thickening of the aortic valve. Aortic valve  regurgitation is not visualized. Severe aortic valve stenosis. Aortic  valve area, by VTI measures 0.98 cm.  Aortic valve mean gradient measures 42.0 mmHg. Aortic valve Vmax measures  4.25 m/s.   6. The inferior vena cava is normal in size with greater than 50%  respiratory variability, suggesting right atrial pressure of 3 mmHg.   Comparison(s): Prior images  reviewed side by side. Changes from prior  study are noted. 09/07/2021 - mean AV gradient 32 mmHg.   FINDINGS   Left Ventricle: Left ventricular ejection fraction, by estimation, is 60  to 65%. The left ventricle has normal function. The left ventricle has no  regional wall motion abnormalities. The left ventricular internal cavity  size was normal in size. There is   moderate left ventricular hypertrophy. Left ventricular diastolic  parameters are consistent with Grade I diastolic dysfunction (impaired  relaxation).   Right Ventricle: The right ventricular size is normal. No increase in  right ventricular wall thickness. Right ventricular systolic function is  normal.   Left Atrium: Left atrial size was normal in size.   Right Atrium: Right atrial size was normal in size.   Pericardium: There is no evidence of pericardial effusion.   Mitral Valve: The mitral valve is normal in structure. Mild mitral annular  calcification. Trivial mitral valve regurgitation. No evidence of mitral  valve stenosis.   Tricuspid Valve: The tricuspid valve is normal in structure. Tricuspid  valve regurgitation is not demonstrated. No evidence of tricuspid  stenosis.   Aortic Valve: The aortic valve is calcified. There is severe calcifcation  of the aortic valve. There is severe thickening of the aortic valve.  Aortic valve regurgitation is not visualized. Severe aortic stenosis is  present. Aortic valve mean gradient  measures 42.0 mmHg. Aortic valve peak gradient measures 72.2 mmHg. Aortic  valve area, by VTI measures 0.98 cm.   Pulmonic Valve: The pulmonic valve was normal in structure. Pulmonic valve  regurgitation is trivial. No evidence of pulmonic stenosis.   Aorta: The aortic root is normal in size and structure.   Venous: The inferior vena cava is normal in size with greater than 50%  respiratory variability, suggesting right atrial pressure of 3 mmHg.   IAS/Shunts: No atrial level  shunt detected by color flow Doppler.   Additional Comments: Aortic stenosis is now severe.     LEFT VENTRICLE  PLAX 2D  LVIDd:         4.40 cm   Diastology  LVIDs:         2.80 cm   LV e' medial:    9.90 cm/s  LV PW:  1.20 cm   LV E/e' medial:  10.4  LV IVS:        1.40 cm   LV e' lateral:   6.85 cm/s  LVOT diam:     2.00 cm   LV E/e' lateral: 15.0  LV SV:         96  LV SV Index:   49  LVOT Area:     3.14 cm     RIGHT VENTRICLE             IVC  RV S prime:     12.10 cm/s  IVC diam: 1.10 cm  TAPSE (M-mode): 1.6 cm  RVSP:           23.4 mmHg   LEFT ATRIUM             Index        RIGHT ATRIUM           Index  LA diam:        3.40 cm 1.72 cm/m   RA Pressure: 3.00 mmHg  LA Vol (A2C):   46.3 ml 23.47 ml/m  RA Area:     14.60 cm  LA Vol (A4C):   42.5 ml 21.55 ml/m  RA Volume:   38.20 ml  19.37 ml/m  LA Biplane Vol: 44.5 ml 22.56 ml/m   AORTIC VALVE  AV Area (Vmax):    1.01 cm  AV Area (Vmean):   0.93 cm  AV Area (VTI):     0.98 cm  AV Vmax:           425.00 cm/s  AV Vmean:          304.500 cm/s  AV VTI:            0.980 m  AV Peak Grad:      72.2 mmHg  AV Mean Grad:      42.0 mmHg  LVOT Vmax:         137.00 cm/s  LVOT Vmean:        90.100 cm/s  LVOT VTI:          0.307 m  LVOT/AV VTI ratio: 0.31    AORTA  Ao Root diam: 2.90 cm  Ao Asc diam:  3.90 cm   MITRAL VALVE                TRICUSPID VALVE  MV Area (PHT): 3.06 cm     TR Peak grad:   20.4 mmHg  MV Decel Time: 248 msec     TR Vmax:        226.00 cm/s  MV E velocity: 103.00 cm/s  Estimated RAP:  3.00 mmHg  MV A velocity: 118.00 cm/s  RVSP:           23.4 mmHg  MV E/A ratio:  0.87                              SHUNTS                              Systemic VTI:  0.31 m                              Systemic Diam: 2.00 cm   Donato Schultz MD  Electronically signed by Donato Schultz MD  Signature Date/Time: 09/20/2022/12:50:53 PM  Final     Physicians  Panel Physicians Referring Physician Case  Authorizing Physician  Tonny Bollman, MD (Primary)     Procedures  LEFT HEART CATH AND CORONARY ANGIOGRAPHY   Conclusion  1.  Chronic total occlusion of the RCA, collateralized by the left coronary artery 2.  Patent left main, LAD, and left circumflex with mild nonobstructive plaquing 3.  Calcified, restricted aortic valve leaflets with known severe aortic stenosis   Recommendations: Continue TAVR evaluation.  Medical therapy for CAD in this patient who does not have exertional angina.   Indications  Severe aortic stenosis [I35.0 (ICD-10-CM)]   Procedural Details  Technical Details INDICATION: Pre-TAVR cath, pt with severe symptomatic aortic stenosis  PROCEDURAL DETAILS: The right wrist is prepped, draped, and anesthetized with 1% lidocaine. Using vascular ultrasound guidance, a 5/6 French Slender sheath is introduced into the right radial artery. 3 mg of verapamil is administered through the sheath, weight-based unfractionated heparin was administered intravenously. Standard Judkins catheters are used for selective coronary angiography.  Catheter exchanges are performed over an exchange length guidewire. There are no immediate procedural complications. A TR band is used for radial hemostasis at the completion of the procedure.  The patient was transferred to the post catheterization recovery area for further monitoring.      Estimated blood loss <50 mL.   During this procedure medications were administered to achieve and maintain moderate conscious sedation while the patient's heart rate, blood pressure, and oxygen saturation were continuously monitored and I was present face-to-face 100% of this time.   Medications (Filter: Administrations occurring from 1246 to 1332 on 12/08/22)  important  Continuous medications are totaled by the amount administered until 12/08/22 1332.   Heparin (Porcine) in NaCl 1000-0.9 UT/500ML-% SOLN (mL)  Total volume: 1,000 mL Date/Time  Rate/Dose/Volume Action   12/08/22 1259 500 mL Given   1259 500 mL Given   midazolam (VERSED) injection (mg)  Total dose: 3 mg Date/Time Rate/Dose/Volume Action   12/08/22 1300 2 mg Given   1311 1 mg Given   fentaNYL (SUBLIMAZE) injection (mcg)  Total dose: 75 mcg Date/Time Rate/Dose/Volume Action   12/08/22 1300 25 mcg Given   1311 50 mcg Given   lidocaine (PF) (XYLOCAINE) 1 % injection (mL)  Total volume: 2 mL Date/Time Rate/Dose/Volume Action   12/08/22 1307 2 mL Given   Radial Cocktail/Verapamil only (mL)  Total volume: 10 mL Date/Time Rate/Dose/Volume Action   12/08/22 1310 10 mL Given   heparin sodium (porcine) injection (Units)  Total dose: 5,000 Units Date/Time Rate/Dose/Volume Action   12/08/22 1315 5,000 Units Given   iohexol (OMNIPAQUE) 350 MG/ML injection (mL)  Total volume: 30 mL Date/Time Rate/Dose/Volume Action   12/08/22 1322 30 mL Given    Sedation Time  Sedation Time Physician-1: 20 minutes 52 seconds Contrast     Administrations occurring from 1246 to 1332 on 12/08/22:  Medication Name Total Dose  iohexol (OMNIPAQUE) 350 MG/ML injection 30 mL   Radiation/Fluoro  Fluoro time: 2.9 (min) DAP: 9227 (mGycm2) Cumulative Air Kerma: 169 (mGy) Coronary Findings  Diagnostic Dominance: Right Left Main  Vessel is angiographically normal. The left main has no obstructive disease. The vessel divides into the LAD and left circumflex.    Left Anterior Descending  There is mild diffuse disease throughout the vessel. The vessel is moderately calcified. The LAD has mild nonobstructive plaquing. The first diagonal is large in caliber with no stenosis. The second diagonal is patent with no stenosis. Vessel reaches the apex and  supplies a collateral to the RCA. The proximal LAD is moderately calcified.    Left Circumflex  The circumflex is patent throughout. The OM branches are patent with no stenosis.    Right Coronary Artery  The proximal RCA is diffusely  diseased in the mid vessel totally occludes, distally the vessel fills by left-to-right collaterals and is seen to fill all the way back to the mid RCA  Prox RCA lesion is 80% stenosed.  Mid RCA lesion is 100% stenosed.    Right Posterior Descending Artery  Collaterals  RPDA filled by collaterals from Dist LAD.      First Right Posterolateral Branch  Collaterals  1st RPL filled by collaterals from 2nd Sept.      Intervention   No interventions have been documented.   Left Heart  Aortic Valve The aortic valve is calcified. There is restricted aortic valve motion.   Coronary Diagrams  Diagnostic Dominance: Right  Intervention   Implants   No implant documentation for this case.   Syngo Images   Show images for CARDIAC CATHETERIZATION Images on Long Term Storage   Show images for Etty, Model to Procedure Log  Procedure Log   Link to Procedure Log  Procedure Log    Hemo Data  Flowsheet Row Most Recent Value  AO Systolic Pressure 125 mmHg  AO Diastolic Pressure 64 mmHg  AO Mean 86 mmHg    ADDENDUM REPORT: 12/12/2022 15:01   CLINICAL DATA:  Severe Aortic Stenosis.   EXAM: Cardiac TAVR CT   TECHNIQUE: A non-contrast, gated CT scan was obtained with axial slices of 3 mm through the heart for aortic valve calcium scoring. A 120 kV retrospective, gated, contrast cardiac scan was obtained. Gantry rotation speed was 250 msecs and collimation was 0.6 mm. Nitroglycerin was not given. The 3D data set was reconstructed in 5% intervals of the 0-95% of the R-R cycle. Systolic and diastolic phases were analyzed on a dedicated workstation using MPR, MIP, and VRT modes. The patient received 100 cc of contrast.   FINDINGS: Image quality: Excellent.   Noise artifact is: Limited.   Valve Morphology: Tricuspid aortic valve with severe calcifications and severe thickening. Severely restricted leaflet movement in systole. Bulky calcification of the NCC.    Aortic Valve Calcium score: 2121   Aortic annular dimension:   Phase assessed: 25%   Annular area: 360 mm2   Annular perimeter: 68.4 mm   Max diameter: 24.3 mm   Min diameter: 18.9 mm   Annular and subannular calcification: None.   Membranous septum length: 7.0 mm   Optimal coplanar projection: RAO 2 CRA 1   Coronary Artery Height above Annulus:   Left Main: 10.7 mm   Right Coronary: 14.2 mm   Sinus of Valsalva Measurements:   Non-coronary: 27 mm   Right-coronary: 28 mm   Left-coronary: 28 mm   Sinus of Valsalva Height:   Non-coronary: 17.2 mm   Right-coronary: 18.1 mm   Left-coronary: 19.0 mm   Sinotubular Junction: 28 mm   Ascending Thoracic Aorta: 35 mm   Coronary Arteries: Normal coronary origin. Right dominance. The study was performed without use of NTG and is insufficient for plaque evaluation. Please refer to recent cardiac catheterization for coronary assessment. 3-vessel coronary calcifications noted.   Cardiac Morphology:   Right Atrium: Right atrial size is within normal limits.   Right Ventricle: The right ventricular cavity is within normal limits.   Left Atrium: Left atrial size is normal in size  with no left atrial appendage filling defect.   Left Ventricle: The ventricular cavity size is within normal limits.   Pulmonary arteries: Normal in size without proximal filling defect.   Pulmonary veins: Normal pulmonary venous drainage.   Pericardium: Normal thickness with no significant effusion or calcium present.   Mitral Valve: The mitral valve is normal structure without significant calcification.   Extra-cardiac findings: See attached radiology report for non-cardiac structures.   IMPRESSION: 1. Tricuspid aortic valve. Annular measurements support a 23 mm S3 or 26 mm Evolut Pro.   2. No significant annular or subannular calcifications.   3. Left main height 10.7 mm but no risk of obstruction with virtual valve.    4. Optimal Fluoroscopic Angle for Delivery: RAO 2 CRA 1   [place] T. Flora Lipps, MD     Electronically Signed   By: Lennie Odor M.D.   On: 12/12/2022 15:01    Narrative & Impression  CLINICAL DATA:  Aortic valve replacement (TAVR), pre-op eval. Aortic valve stenosis.   EXAM: CT ANGIOGRAPHY CHEST, ABDOMEN AND PELVIS   TECHNIQUE: Multidetector CT imaging through the chest, abdomen and pelvis was performed using the standard protocol during bolus administration of intravenous contrast. Multiplanar reconstructed images and MIPs were obtained and reviewed to evaluate the vascular anatomy.   RADIATION DOSE REDUCTION: This exam was performed according to the departmental dose-optimization program which includes automated exposure control, adjustment of the mA and/or kV according to patient size and/or use of iterative reconstruction technique.   CONTRAST:  OMNIPAQUE IOHEXOL 350 MG/ML SOLN   COMPARISON:  01/24/2018 coronary CT.   FINDINGS: CTA CHEST FINDINGS   Cardiovascular: Normal heart size. No significant pericardial effusion/thickening. Three-vessel coronary atherosclerosis. Diffusely coarsely calcified and thickened aortic valve. Atherosclerotic nonaneurysmal thoracic aorta. Normal caliber pulmonary arteries. No central pulmonary emboli.   Mediastinum/Nodes: No significant thyroid nodules. Unremarkable esophagus. No pathologically enlarged axillary, mediastinal or hilar lymph nodes.   Lungs/Pleura: No pneumothorax. No pleural effusion. Indistinct 0.5 cm left upper lobe pulmonary nodule on series 8/image 56: Stable from 01/24/2018 CT, considered benign. No acute consolidative airspace disease, lung masses or additional significant pulmonary nodules.   Musculoskeletal: No aggressive appearing focal osseous lesions. Moderate thoracic spondylosis.   CTA ABDOMEN AND PELVIS FINDINGS   Hepatobiliary: Normal liver with no liver mass. Cholecystectomy. No biliary  ductal dilatation.   Pancreas: Normal, with no mass or duct dilation.   Spleen: Normal size. No mass.   Adrenals/Urinary Tract: Normal adrenals. Hypodense exophytic 1.8 cm posterior lower left renal cortical lesion (series 7/image 127). No additional contour deforming renal lesions. No hydronephrosis. Normal bladder.   Stomach/Bowel: Normal non-distended stomach. Normal caliber small bowel with no small bowel wall thickening. Normal appendix. Marked sigmoid diverticulosis with no large bowel wall thickening or significant pericolonic fat stranding.   Vascular/Lymphatic: Atherosclerotic nonaneurysmal abdominal aorta. High left retroperitoneal 2.2 x 1.9 cm solid mass abutting the left diaphragmatic crus and medial margin of the left adrenal gland (series 7/image 103), not previously imaged. Otherwise, no pathologically enlarged lymph nodes in the abdomen or pelvis.   Reproductive: Status post hysterectomy, with no abnormal findings at the vaginal cuff. No adnexal mass.   Other: No pneumoperitoneum, ascites or focal fluid collection. Small fat containing umbilical hernia.   Musculoskeletal: No aggressive appearing focal osseous lesions. Moderate lumbar degenerative disc disease.   VASCULAR MEASUREMENTS PERTINENT TO TAVR:   AORTA:   Minimal Aortic Diameter-14.0 x 12.6 mm   Severity of Aortic Calcification-moderate   RIGHT PELVIS:  Right Common Iliac Artery -   Minimal Diameter-9.3 x 8.9 mm   Tortuosity-mild   Calcification-moderate to severe   Right External Iliac Artery -   Minimal Diameter-7.0 x 7.0 mm   Tortuosity-mild   Calcification-moderate   Right Common Femoral Artery -   Minimal Diameter-6.9 x 5.3 mm   Tortuosity-mild   Calcification-moderate   LEFT PELVIS:   Left Common Iliac Artery -   Minimal Diameter-10.4 x 9.6 mm   Tortuosity-mild   Calcification-severe   Left External Iliac Artery -   Minimal Diameter-7.0 x 6.0 mm    Tortuosity-mild   Calcification-moderate   Left Common Femoral Artery -   Minimal Diameter-5.8 x 5.1 mm   Tortuosity-mild   Calcification-moderate to severe   Review of the MIP images confirms the above findings.   IMPRESSION: 1. Vascular findings and measurements pertinent to potential TAVR procedure, as described. 2. Diffusely coarsely calcified and thickened aortic valve, compatible with reported aortic stenosis. 3. Indeterminate high left retroperitoneal 2.2 x 1.9 cm solid mass abutting the left diaphragmatic crus and medial margin of the left adrenal gland, not previously imaged. Malignancy not excluded. Suggest further evaluation with PET-CT at this time. 4. Hypodense exophytic 1.8 cm posterior lower left renal cortical lesion, indeterminate, cannot exclude a solid renal neoplasm. MRI (preferred) or CT abdomen without and with IV contrast is indicated for further evaluation. 5. Three-vessel coronary atherosclerosis. 6. Marked sigmoid diverticulosis. 7.  Aortic Atherosclerosis (ICD10-I70.0).     Electronically Signed   By: Delbert Phenix M.D.   On: 12/12/2022 12:33     Impression:  This 87 year old woman has stage D, severe, symptomatic aortic stenosis with NYHA class II symptoms of exertional fatigue and shortness of breath consistent with chronic diastolic congestive heart failure as well as frequent episodes of dizziness.  I have personally reviewed her 2D echo, cardiac catheterization, and CTA studies.  Her echo shows a severely calcified aortic valve with restricted leaflet mobility with a mean gradient of 42 mmHg and a valve area of 0.98 cm.  Left ventricular ejection fraction is normal.  Cardiac catheterization shows chronic total occlusion of the RCA which is collateralized from the left coronary artery.  There is otherwise no significant obstructive disease.  I agree that aortic valve replacement is indicated in this patient for relief of her symptoms and to  prevent progressive left ventricular dysfunction.  Given her advanced age, limited mobility and frailty I think transcatheter aortic valve replacement would be the best option for treating her.  I do not think she would be a candidate for open surgical AVR.  Her gated cardiac CTA shows anatomy suitable for TAVR using a 26 mm Medtronic FX valve.  Her abdominal and pelvic CTA shows adequate pelvic vascular anatomy to allow transfemoral insertion.  The patient and her family were counseled at length regarding treatment alternatives for management of severe symptomatic aortic stenosis. The risks and benefits of surgical intervention has been discussed in detail. Long-term prognosis with medical therapy was discussed. Alternative approaches such as conventional surgical aortic valve replacement, transcatheter aortic valve replacement, and palliative medical therapy were compared and contrasted at length. This discussion was placed in the context of the patient's own specific clinical presentation and past medical history. All of their questions have been addressed.   Following the decision to proceed with transcatheter aortic valve replacement, a discussion was held regarding what types of management strategies would be attempted intraoperatively in the event of life-threatening complications.  I do not  think she is a candidate for emergent sternotomy to manage any intraoperative complications.  The patient has been advised of a variety of complications that might develop including but not limited to risks of death, stroke, paravalvular leak, aortic dissection or other major vascular complications, aortic annulus rupture, device embolization, cardiac rupture or perforation, mitral regurgitation, acute myocardial infarction, arrhythmia, heart block or bradycardia requiring permanent pacemaker placement, congestive heart failure, respiratory failure, renal failure, pneumonia, infection, other late complications related  to structural valve deterioration or migration, or other complications that might ultimately cause a temporary or permanent loss of functional independence or other long term morbidity. The patient provides full informed consent for the procedure as described and all questions were answered.      Plan:  She will be scheduled for transfemoral TAVR using a Medtronic valve on 01/03/2023.  I spent 60 minutes performing this consultation and > 50% of this time was spent face to face counseling and coordinating the care of this patient's severe symptomatic aortic stenosis.   Alleen Borne, MD 12/21/2022

## 2022-12-26 ENCOUNTER — Encounter (HOSPITAL_COMMUNITY)
Admission: RE | Admit: 2022-12-26 | Discharge: 2022-12-26 | Disposition: A | Discharge status: Home / Self Care | Payer: Medicare Other | Source: Ambulatory Visit | Attending: Physician Assistant | Admitting: Physician Assistant

## 2022-12-26 DIAGNOSIS — I2510 Atherosclerotic heart disease of native coronary artery without angina pectoris: Secondary | ICD-10-CM | POA: Diagnosis not present

## 2022-12-26 DIAGNOSIS — N289 Disorder of kidney and ureter, unspecified: Secondary | ICD-10-CM | POA: Diagnosis not present

## 2022-12-26 DIAGNOSIS — R93422 Abnormal radiologic findings on diagnostic imaging of left kidney: Secondary | ICD-10-CM | POA: Insufficient documentation

## 2022-12-26 DIAGNOSIS — N281 Cyst of kidney, acquired: Secondary | ICD-10-CM | POA: Diagnosis not present

## 2022-12-26 DIAGNOSIS — R1900 Intra-abdominal and pelvic swelling, mass and lump, unspecified site: Secondary | ICD-10-CM | POA: Insufficient documentation

## 2022-12-26 DIAGNOSIS — K5730 Diverticulosis of large intestine without perforation or abscess without bleeding: Secondary | ICD-10-CM | POA: Diagnosis not present

## 2022-12-26 LAB — GLUCOSE, CAPILLARY: Glucose-Capillary: 89 mg/dL (ref 70–99)

## 2022-12-26 MED ORDER — FLUDEOXYGLUCOSE F - 18 (FDG) INJECTION
12.0000 | Freq: Once | INTRAVENOUS | Status: AC | PRN
  Administered 2022-12-26: 11.13 via INTRAVENOUS

## 2022-12-27 ENCOUNTER — Other Ambulatory Visit: Payer: Self-pay

## 2022-12-27 DIAGNOSIS — I350 Nonrheumatic aortic (valve) stenosis: Secondary | ICD-10-CM

## 2022-12-30 ENCOUNTER — Other Ambulatory Visit: Payer: Self-pay

## 2022-12-30 ENCOUNTER — Encounter (HOSPITAL_COMMUNITY)
Admission: RE | Admit: 2022-12-30 | Discharge: 2022-12-30 | Disposition: A | Discharge status: Home / Self Care | Payer: Medicare Other | Source: Ambulatory Visit | Attending: Cardiovascular Disease

## 2022-12-30 ENCOUNTER — Ambulatory Visit (HOSPITAL_COMMUNITY)
Admission: RE | Admit: 2022-12-30 | Discharge: 2022-12-30 | Disposition: A | Discharge status: Home / Self Care | Payer: Medicare Other | Source: Ambulatory Visit | Attending: Cardiovascular Disease | Admitting: Cardiovascular Disease

## 2022-12-30 DIAGNOSIS — Z01818 Encounter for other preprocedural examination: Secondary | ICD-10-CM | POA: Insufficient documentation

## 2022-12-30 DIAGNOSIS — I77810 Thoracic aortic ectasia: Secondary | ICD-10-CM | POA: Diagnosis not present

## 2022-12-30 DIAGNOSIS — I440 Atrioventricular block, first degree: Secondary | ICD-10-CM | POA: Diagnosis not present

## 2022-12-30 DIAGNOSIS — I350 Nonrheumatic aortic (valve) stenosis: Secondary | ICD-10-CM | POA: Insufficient documentation

## 2022-12-30 DIAGNOSIS — Z48812 Encounter for surgical aftercare following surgery on the circulatory system: Secondary | ICD-10-CM | POA: Diagnosis not present

## 2022-12-30 LAB — COMPREHENSIVE METABOLIC PANEL
ALT: 15 U/L (ref 0–44)
AST: 16 U/L (ref 15–41)
Albumin: 3.9 g/dL (ref 3.5–5.0)
Alkaline Phosphatase: 35 U/L — ABNORMAL LOW (ref 38–126)
Anion gap: 8 (ref 5–15)
BUN: 10 mg/dL (ref 8–23)
CO2: 30 mmol/L (ref 22–32)
Calcium: 9 mg/dL (ref 8.9–10.3)
Chloride: 102 mmol/L (ref 98–111)
Creatinine, Ser: 0.65 mg/dL (ref 0.44–1.00)
GFR, Estimated: >60 mL/min (ref >60)
Glucose, Bld: 106 mg/dL — ABNORMAL HIGH (ref 70–99)
Potassium: 4.2 mmol/L (ref 3.5–5.1)
Sodium: 140 mmol/L (ref 135–145)
Total Bilirubin: 1.3 mg/dL — ABNORMAL HIGH (ref <1.2)
Total Protein: 6.5 g/dL (ref 6.5–8.1)

## 2022-12-30 LAB — URINALYSIS, ROUTINE W REFLEX MICROSCOPIC
Bilirubin Urine: NEGATIVE
Glucose, UA: NEGATIVE mg/dL
Hgb urine dipstick: NEGATIVE
Ketones, ur: NEGATIVE mg/dL
Leukocytes,Ua: NEGATIVE
Nitrite: NEGATIVE
Protein, ur: NEGATIVE mg/dL
Specific Gravity, Urine: 1.003 — ABNORMAL LOW (ref 1.005–1.030)
pH: 7 (ref 5.0–8.0)

## 2022-12-30 LAB — CBC
HCT: 44.8 % (ref 36.0–46.0)
Hemoglobin: 14.6 g/dL (ref 12.0–15.0)
MCH: 34.8 pg — ABNORMAL HIGH (ref 26.0–34.0)
MCHC: 32.6 g/dL (ref 30.0–36.0)
MCV: 106.9 fL — ABNORMAL HIGH (ref 80.0–100.0)
Platelets: 140 10*3/uL — ABNORMAL LOW (ref 150–400)
RBC: 4.19 MIL/uL (ref 3.87–5.11)
RDW: 13.2 % (ref 11.5–15.5)
WBC: 5.8 10*3/uL (ref 4.0–10.5)
nRBC: 0 % (ref 0.0–0.2)

## 2022-12-30 LAB — PROTIME-INR
INR: 1 (ref 0.8–1.2)
Prothrombin Time: 12.9 s (ref 11.4–15.2)

## 2022-12-30 LAB — SURGICAL PCR SCREEN
MRSA, PCR: NEGATIVE
Staphylococcus aureus: NEGATIVE

## 2022-12-30 LAB — TYPE AND SCREEN
ABO/RH(D): A POS
Antibody Screen: NEGATIVE

## 2022-12-30 NOTE — Progress Notes (Signed)
Patient signed all consents at PAT lab appointment. CHG soap and instructions were given to patient. CHG surgical prep reviewed with patient and all questions answered.  Patients chart send to anesthesia for review. Pt denies any respiratory illness/infection in the last two months.

## 2022-12-31 LAB — SARS CORONAVIRUS 2 (TAT 6-24 HRS): SARS Coronavirus 2: NEGATIVE

## 2023-01-02 MED ORDER — CEFAZOLIN SODIUM-DEXTROSE 2-4 GM/100ML-% IV SOLN
2.0000 g | INTRAVENOUS | Status: AC
  Administered 2023-01-03: 2 g via INTRAVENOUS
  Filled 2023-01-02: qty 100

## 2023-01-02 MED ORDER — MAGNESIUM SULFATE 50 % IJ SOLN
40.0000 meq | INTRAMUSCULAR | Status: DC
  Filled 2023-01-02 (×2): qty 9.85

## 2023-01-02 MED ORDER — POTASSIUM CHLORIDE 2 MEQ/ML IV SOLN
80.0000 meq | INTRAVENOUS | Status: DC
  Filled 2023-01-02 (×2): qty 40

## 2023-01-02 MED ORDER — HEPARIN 30,000 UNITS/1000 ML (OHS) CELLSAVER SOLUTION
Status: DC
  Filled 2023-01-02 (×2): qty 1000

## 2023-01-02 MED ORDER — DEXMEDETOMIDINE HCL IN NACL 400 MCG/100ML IV SOLN
0.1000 ug/kg/h | INTRAVENOUS | Status: AC
  Administered 2023-01-03: 1 ug/kg/h via INTRAVENOUS
  Administered 2023-01-03: 8 ug via INTRAVENOUS
  Filled 2023-01-02: qty 100

## 2023-01-02 MED ORDER — NOREPINEPHRINE 4 MG/250ML-% IV SOLN
0.0000 ug/min | INTRAVENOUS | Status: AC
  Administered 2023-01-03: 1 ug/min via INTRAVENOUS
  Filled 2023-01-02: qty 250

## 2023-01-02 NOTE — H&P (Signed)
[address]       [name] [address]             [phone]      Cardiothoracic Surgery Admission History and Physical   PCP is Rodrigo Ran, MD Referring Provider is Tonny Bollman, MD Primary Cardiologist is Nicki Guadalajara, MD   Reason for admission:  Severe aortic stenosis   HPI:   The patient is an 87 year old woman with a history of hypertension, hyperlipidemia, hypothyroidism, rheumatoid arthritis status post bilateral knee replacements on methotrexate and Tocilizumab,  and aortic stenosis who has been followed with a heart murmur for many years with serial echocardiogram showing mild aortic stenosis in 2019, moderate aortic stenosis in 2022 with a mean gradient of 28 mmHg and increased to 32 mmHg in 2023.  Her most recent echocardiogram shows an increase in the mean gradient to 42 mmHg with a valve area of 0.98 cm.  Left ventricular ejection fraction is 60 to 65%.  She has developed frequent and recurrent episodes of dizziness particularly when she first gets up from laying down or sitting.  She has also been having episodes of dizziness with walking.  She moves slowly using a walker due to her rheumatoid arthritis and knee replacements.  She has noted some exertional shortness of breath and aching pain in the left side of her chest.  She occasionally has some lower extremity edema in her ankles usually after increased sodium intake.   Her activity is limited due to her rheumatoid arthritis as well as prior knee replacements.  She said that her right knee works fairly well but the left has been a persistent problem.  She was driving until the past few months when she started having more dizziness.           Past Medical History:  Diagnosis Date   Arthritis      rheumatoid   GERD (gastroesophageal reflux disease)     Hyperlipidemia     Hyperthyroidism     Hypothyroidism     Macular degeneration     Osteoporosis     PONV (postoperative nausea and  vomiting)      none recently   Severe aortic stenosis     Shingles 04/21/2014    seen by Dr Waynard Edwards and treated with Valtrex               Past Surgical History:  Procedure Laterality Date   ABDOMINAL HYSTERECTOMY   1984    ovarys left in   BREAST BIOPSY Right 2018   CHOLECYSTECTOMY   2005   DILATION AND CURETTAGE OF UTERUS       JOINT REPLACEMENT   1990    right knee   KNEE RECONSTRUCTION Left 09/26/12   LEFT HEART CATH AND CORONARY ANGIOGRAPHY N/A 12/08/2022    Procedure: LEFT HEART CATH AND CORONARY ANGIOGRAPHY;  Surgeon: Tonny Bollman, MD;  Location: [facility] INVASIVE CV [facility];  Service: Cardiovascular;  Laterality: N/A;   left knee replacement   2012    x2 surgeries-ruptures patella tendon, fractured patella   PATELLAR TENDON REPAIR   07/21/2011    Procedure: PATELLA TENDON REPAIR;  Surgeon: Loanne Drilling, MD;  Location: [facility];  Service: Orthopedics;  Laterality: Left;  Left Knee Patella Tendon Reconstruction with allograft   QUADRICEPS TENDON REPAIR Left 09/26/2012    Procedure: LEFT EXTENSOR MECHANISM RECONSTRUCTION/WITH GRAFT;  Surgeon: Loanne Drilling, MD;  Location: [facility];  Service: Orthopedics;  Laterality: Left;  REPAIR EXTENSOR TENDON Right 03/05/2015    Procedure: RECONSTRUCTION EXTENSOR HOOD RIGHT MIDDLE FINGER;  Surgeon: Cindee Salt, MD;  Location: [facility];  Service: Orthopedics;  Laterality: Right;  ANESTHESIA:  [facility]   TONSILLECTOMY        as child   wrist bilateral   [id number]    for arthritis               Family History  Problem Relation Age of Onset   Throat cancer Mother 19   Lung cancer Father 76   Arthritis/Rheumatoid Sister     Breast cancer Neg Hx            Social History         Socioeconomic History   Marital status: Divorced      Spouse name: Not on file   Number of children: 3   Years of education: Not on file   Highest education level: Not on file  Occupational History   Not on file  Tobacco Use    Smoking status: Former      Current packs/day: 0.00      Average packs/day: 0.5 packs/day for 15.0 years (7.5 ttl pk-yrs)      Types: Cigarettes      Start date: 02/07/1990      Quit date: 02/07/2005      Years since quitting: 17.8   Smokeless tobacco: Never  Substance and Sexual Activity   Alcohol use: No   Drug use: No   Sexual activity: Not on file  Other Topics Concern   Not on file  Social History Narrative   Not on file    Social Determinants of Health    Financial Resource Strain: Not on file  Food Insecurity: Not on file  Transportation Needs: Not on file  Physical Activity: Not on file  Stress: Not on file  Social Connections: Not on file  Intimate Partner Violence: Not on file             Prior to Admission medications   Medication Sig Start Date End Date Taking? Authorizing Provider  acetaminophen (TYLENOL) 500 MG tablet Take 500 mg by mouth every 6 (six) hours as needed for headache.     Yes [provider]  Cholecalciferol (VITAMIN D3) 250 MCG (10000 UT) capsule Take 10,000 Units by mouth every Monday, Wednesday, and Friday.     Yes [provider]  diphenhydrAMINE (BENADRYL ALLERGY) 25 MG tablet Take 12.5 mg by mouth 2 (two) times daily. 04/21/14   Yes [provider]  diphenhydrAMINE (BENADRYL) 50 MG/ML injection 50 mg Injection with infusions     Yes [provider]  estrogens, conjugated, (PREMARIN) 0.3 MG tablet Take 0.3 mg by mouth daily with breakfast.      Yes [provider]  folic acid (FOLVITE) 1 MG tablet Take 1 mg by mouth daily with breakfast.     Yes [provider]  methotrexate (RHEUMATREX) 2.5 MG tablet Take 7.5 mg by mouth 2 (two) times a week. Caution:Chemotherapy. Protect from light. On Saturdays and Sundays     Yes [provider]  Multiple Vitamins-Minerals (PRESERVISION AREDS 2 PO) Take 1 capsule by mouth 2 (two) times daily.     Yes [provider]  OVER THE COUNTER  MEDICATION Take 1 tablet by mouth daily. [employer] supplement     Yes [provider]  Propylene Glycol (SYSTANE BALANCE OP) Place 1 drop into both eyes 2 (two)  times daily as needed (dry eyes).     Yes [provider]  Psyllium (METAMUCIL 3 IN 1 DAILY FIBER PO) Take 1-2 capsules by mouth daily.     Yes [provider]  RESTASIS 0.05 % ophthalmic emulsion Place 1 drop into both eyes 2 (two) times daily. 10/24/22   Yes [provider]  rosuvastatin (CRESTOR) 20 MG tablet Take 20 mg by mouth daily.     Yes [provider]  saccharomyces boulardii (FLORASTOR) 250 MG capsule Take 250 mg by mouth every other day.     Yes [provider]  tocilizumab (ACTEMRA) 400 MG/20ML SOLN injection Inject into the vein every 30 (thirty) days.     Yes [provider]  traMADol (ULTRAM) 50 MG tablet Take 1-2 tablets (50-100 mg total) by mouth every 6 (six) hours as needed. Patient taking differently: Take 25 mg by mouth daily. 09/28/12   Yes [place], Hospital doctor, PA-C  vitamin C (ASCORBIC ACID) 500 MG tablet Take 500 mg by mouth every other day.     Yes [provider]            Current Outpatient Medications  Medication Sig Dispense Refill   acetaminophen (TYLENOL) 500 MG tablet Take 500 mg by mouth every 6 (six) hours as needed for headache.       Cholecalciferol (VITAMIN D3) 250 MCG (10000 UT) capsule Take 10,000 Units by mouth every Monday, Wednesday, and Friday.       diphenhydrAMINE (BENADRYL ALLERGY) 25 MG tablet Take 12.5 mg by mouth 2 (two) times daily.       diphenhydrAMINE (BENADRYL) 50 MG/ML injection 50 mg Injection with infusions       estrogens, conjugated, (PREMARIN) 0.3 MG tablet Take 0.3 mg by mouth daily with breakfast.        folic acid (FOLVITE) 1 MG tablet Take 1 mg by mouth daily with breakfast.       methotrexate (RHEUMATREX) 2.5 MG tablet Take 7.5 mg by mouth 2 (two) times a week. Caution:Chemotherapy. Protect from  light. On Saturdays and Sundays       Multiple Vitamins-Minerals (PRESERVISION AREDS 2 PO) Take 1 capsule by mouth 2 (two) times daily.       OVER THE COUNTER MEDICATION Take 1 tablet by mouth daily. [employer]o supplement       Propylene Glycol (SYSTANE BALANCE OP) Place 1 drop into both eyes 2 (two) times daily as needed (dry eyes).       Psyllium (METAMUCIL 3 IN 1 DAILY FIBER PO) Take 1-2 capsules by mouth daily.       RESTASIS 0.05 % ophthalmic emulsion Place 1 drop into both eyes 2 (two) times daily.       rosuvastatin (CRESTOR) 20 MG tablet Take 20 mg by mouth daily.       saccharomyces boulardii (FLORASTOR) 250 MG capsule Take 250 mg by mouth every other day.       tocilizumab (ACTEMRA) 400 MG/20ML SOLN injection Inject into the vein every 30 (thirty) days.       traMADol (ULTRAM) 50 MG tablet Take 1-2 tablets (50-100 mg total) by mouth every 6 (six) hours as needed. (Patient taking differently: Take 25 mg by mouth daily.) 80 tablet 1   vitamin C (ASCORBIC ACID) 500 MG tablet Take 500 mg by mouth every other day.          No current facility-administered medications for this visit.        Allergies  Allergies  Allergen Reactions   Atorvastatin        hands hurt   Morphine And Codeine Other (See Comments)      "makes me queezy"   Sulfa Antibiotics Hives      Hives as child-age 19            Review of Systems:               General:                      normal appetite, + decreased energy, no weight gain, no weight loss, no fever             Cardiac:                       + occasional aching left chest pain with exertion, no chest pain at rest, + SOB with moderate exertion, no resting SOB, no PND, no orthopnea, no palpitations, no arrhythmia, no atrial fibrillation, + LE edema, + frequent dizzy spells, no syncope             Respiratory:                 + exertional shortness of breath, no home oxygen, no productive cough, no dry cough, no bronchitis, no wheezing, no  hemoptysis, no asthma, no pain with inspiration or cough, no sleep apnea, no CPAP at night             GI:                               no difficulty swallowing, no reflux, no frequent heartburn, no hiatal hernia, no abdominal pain, no constipation, no diarrhea, no hematochezia, no hematemesis, no melena             GU:                              no dysuria,  no frequency, no urinary tract infection, no hematuria, no kidney stones, no kidney disease             Vascular:                     no pain suggestive of claudication, no pain in feet, no leg cramps, no varicose veins, no DVT, no non-healing foot ulcer             Neuro:                         no stroke, no TIA's, no seizures, no headaches, no temporary blindness one eye,  no slurred speech, no peripheral neuropathy, no chronic pain, + instability of gait, no memory/cognitive dysfunction             Musculoskeletal:         + arthritis - primarily involving the knees and hands, + joint swelling, + myalgias, + difficulty walking, + decreased mobility              Skin:                            no rash, + itching, no skin infections, no pressure sores or ulcerations  Psych:                         no anxiety, no depression, no nervousness, no unusual recent stress             Eyes:                           no blurry vision, no floaters, no recent vision changes, + wears glasses              ENT:                            no hearing loss, no loose or painful teeth, no dentures, last saw dentist 12/2022             Hematologic:               no easy bruising, no abnormal bleeding, no clotting disorder, no frequent epistaxis             Endocrine:                   no diabetes, does not check CBG's at home                            Physical Exam:               BP 136/79 (BP Location: Left Arm, Patient Position: Sitting)   Pulse 77   Resp 18   Ht 5\' 4"  (1.626 m)   Wt 208 lb (94.3 kg)   SpO2 94% Comment: RA  BMI 35.70 kg/m               General:                      Elderly, frail-appearing             HEENT:                       Unremarkable, NCAT, PERLA, EOMI             Neck:                           no JVD, no bruits, no adenopathy              Chest:                          clear to auscultation, symmetrical breath sounds, no wheezes, no rhonchi              CV:                              RRR, 3/6 systolic murmur RSB, no diastolic murmur             Abdomen:                    soft, non-tender, no masses              Extremities:                 warm, well-perfused, pulses palpable at ankles, mild lower extremity  edema             Rectal/GU                   Deferred             Neuro:                         Grossly non-focal and symmetrical throughout             Skin:                            Clean and dry, no rashes, no breakdown   Diagnostic Tests:   ECHOCARDIOGRAM REPORT       Patient Name:   [name] Date of Exam: 09/20/2022  Medical Rec #:  [id number]        Height:       63.0 in  Accession #:    [id number]       Weight:       209.6 lb  Date of Birth:  1935-05-20       BSA:          1.973 m  Patient Age:    86 years         BP:           128/82 mmHg  Patient Gender: F                HR:           77 bpm.  Exam Location:  [address]   Procedure: 2D Echo, Cardiac Doppler and Color Doppler   Indications:    I35.0 AS    History:        Patient has prior history of Echocardiogram examinations,  most                 recent 09/07/2021. AS; Risk Factors:Former Smoker and                  Dyslipidemia.    Sonographer:    Samule Ohm RDCS  Referring Phys: [phone] THOMAS A KELLY   IMPRESSIONS     1. Aortic stenosis is now severe.   2. Left ventricular ejection fraction, by estimation, is 60 to 65%. The  left ventricle has normal function. The left ventricle has no regional  wall motion abnormalities. There is moderate left ventricular hypertrophy.  Left ventricular  diastolic  parameters are consistent with Grade I diastolic dysfunction (impaired  relaxation).   3. Right ventricular systolic function is normal. The right ventricular  size is normal.   4. The mitral valve is normal in structure. Trivial mitral valve  regurgitation. No evidence of mitral stenosis.   5. The aortic valve is calcified. There is severe calcifcation of the  aortic valve. There is severe thickening of the aortic valve. Aortic valve  regurgitation is not visualized. Severe aortic valve stenosis. Aortic  valve area, by VTI measures 0.98 cm.  Aortic valve mean gradient measures 42.0 mmHg. Aortic valve Vmax measures  4.25 m/s.   6. The inferior vena cava is normal in size with greater than 50%  respiratory variability, suggesting right atrial pressure of 3 mmHg.   Comparison(s): Prior images reviewed side by side. Changes from prior  study are noted. 09/07/2021 - mean AV gradient 32 mmHg.   FINDINGS   Left Ventricle: Left ventricular ejection fraction, by  estimation, is 60  to 65%. The left ventricle has normal function. The left ventricle has no  regional wall motion abnormalities. The left ventricular internal cavity  size was normal in size. There is   moderate left ventricular hypertrophy. Left ventricular diastolic  parameters are consistent with Grade I diastolic dysfunction (impaired  relaxation).   Right Ventricle: The right ventricular size is normal. No increase in  right ventricular wall thickness. Right ventricular systolic function is  normal.   Left Atrium: Left atrial size was normal in size.   Right Atrium: Right atrial size was normal in size.   Pericardium: There is no evidence of pericardial effusion.   Mitral Valve: The mitral valve is normal in structure. Mild mitral annular  calcification. Trivial mitral valve regurgitation. No evidence of mitral  valve stenosis.   Tricuspid Valve: The tricuspid valve is normal in structure. Tricuspid  valve  regurgitation is not demonstrated. No evidence of tricuspid  stenosis.   Aortic Valve: The aortic valve is calcified. There is severe calcifcation  of the aortic valve. There is severe thickening of the aortic valve.  Aortic valve regurgitation is not visualized. Severe aortic stenosis is  present. Aortic valve mean gradient  measures 42.0 mmHg. Aortic valve peak gradient measures 72.2 mmHg. Aortic  valve area, by VTI measures 0.98 cm.   Pulmonic Valve: The pulmonic valve was normal in structure. Pulmonic valve  regurgitation is trivial. No evidence of pulmonic stenosis.   Aorta: The aortic root is normal in size and structure.   Venous: The inferior vena cava is normal in size with greater than 50%  respiratory variability, suggesting right atrial pressure of 3 mmHg.   IAS/Shunts: No atrial level shunt detected by color flow Doppler.   Additional Comments: Aortic stenosis is now severe.     LEFT VENTRICLE  PLAX 2D  LVIDd:         4.40 cm   Diastology  LVIDs:         2.80 cm   LV e' medial:    9.90 cm/s  LV PW:         1.20 cm   LV E/e' medial:  10.4  LV IVS:        1.40 cm   LV e' lateral:   6.85 cm/s  LVOT diam:     2.00 cm   LV E/e' lateral: 15.0  LV SV:         96  LV SV Index:   49  LVOT Area:     3.14 cm     RIGHT VENTRICLE             IVC  RV S prime:     12.10 cm/s  IVC diam: 1.10 cm  TAPSE (M-mode): 1.6 cm  RVSP:           23.4 mmHg   LEFT ATRIUM             Index        RIGHT ATRIUM           Index  LA diam:        3.40 cm 1.72 cm/m   RA Pressure: 3.00 mmHg  LA Vol (A2C):   46.3 ml 23.47 ml/m  RA Area:     14.60 cm  LA Vol (A4C):   42.5 ml 21.55 ml/m  RA Volume:   38.20 ml  19.37 ml/m  LA Biplane Vol: 44.5 ml 22.56 ml/m   AORTIC VALVE  AV Area (Vmax):    1.01 cm  AV Area (Vmean):   0.93 cm  AV Area (VTI):     0.98 cm  AV Vmax:           425.00 cm/s  AV Vmean:          304.500 cm/s  AV VTI:            0.980 m  AV Peak Grad:      72.2 mmHg  AV  Mean Grad:      42.0 mmHg  LVOT Vmax:         137.00 cm/s  LVOT Vmean:        90.100 cm/s  LVOT VTI:          0.307 m  LVOT/AV VTI ratio: 0.31    AORTA  Ao Root diam: 2.90 cm  Ao Asc diam:  3.90 cm   MITRAL VALVE                TRICUSPID VALVE  MV Area (PHT): 3.06 cm     TR Peak grad:   20.4 mmHg  MV Decel Time: 248 msec     TR Vmax:        226.00 cm/s  MV E velocity: 103.00 cm/s  Estimated RAP:  3.00 mmHg  MV A velocity: 118.00 cm/s  RVSP:           23.4 mmHg  MV E/A ratio:  0.87                              SHUNTS                              Systemic VTI:  0.31 m                              Systemic Diam: 2.00 cm   Donato Schultz MD  Electronically signed by Donato Schultz MD  Signature Date/Time: 09/20/2022/12:50:53 PM        Final      Physicians   Panel Physicians Referring Physician Case Authorizing Physician  Tonny Bollman, MD (Primary)        Procedures   LEFT HEART CATH AND CORONARY ANGIOGRAPHY    Conclusion   1.  Chronic total occlusion of the RCA, collateralized by the left coronary artery 2.  Patent left main, LAD, and left circumflex with mild nonobstructive plaquing 3.  Calcified, restricted aortic valve leaflets with known severe aortic stenosis   Recommendations: Continue TAVR evaluation.  Medical therapy for CAD in this patient who does not have exertional angina.   Indications   Severe aortic stenosis [I35.0 (ICD-10-CM)]    Procedural Details   Technical Details INDICATION: Pre-TAVR cath, pt with severe symptomatic aortic stenosis  PROCEDURAL DETAILS: The right wrist is prepped, draped, and anesthetized with 1% lidocaine. Using vascular ultrasound guidance, a 5/6 French Slender sheath is introduced into the right radial artery. 3 mg of verapamil is administered through the sheath, weight-based unfractionated heparin was administered intravenously. Standard Judkins catheters are used for selective coronary angiography.  Catheter exchanges are  performed over an exchange length guidewire. There are no immediate procedural complications. A TR band is used for radial hemostasis at the completion of the procedure.  The patient was transferred to the post catheterization recovery area for  further monitoring.      Estimated blood loss <50 mL.   During this procedure medications were administered to achieve and maintain moderate conscious sedation while the patient's heart rate, blood pressure, and oxygen saturation were continuously monitored and I was present face-to-face 100% of this time.    Medications (Filter: Administrations occurring from 1246 to 1332 on 12/08/22)  important  Continuous medications are totaled by the amount administered until 12/08/22 1332.    Heparin (Porcine) in NaCl 1000-0.9 UT/500ML-% SOLN (mL)  Total volume: 1,000 mL Date/Time Rate/Dose/Volume Action    12/08/22 1259 500 mL Given    1259 500 mL Given    midazolam (VERSED) injection (mg)  Total dose: 3 mg Date/Time Rate/Dose/Volume Action    12/08/22 1300 2 mg Given    1311 1 mg Given    fentaNYL (SUBLIMAZE) injection (mcg)  Total dose: 75 mcg Date/Time Rate/Dose/Volume Action    12/08/22 1300 25 mcg Given    1311 50 mcg Given    lidocaine (PF) (XYLOCAINE) 1 % injection (mL)  Total volume: 2 mL Date/Time Rate/Dose/Volume Action    12/08/22 1307 2 mL Given    Radial Cocktail/Verapamil only (mL)  Total volume: 10 mL Date/Time Rate/Dose/Volume Action    12/08/22 1310 10 mL Given    heparin sodium (porcine) injection (Units)  Total dose: 5,000 Units Date/Time Rate/Dose/Volume Action    12/08/22 1315 5,000 Units Given    iohexol (OMNIPAQUE) 350 MG/ML injection (mL)  Total volume: 30 mL Date/Time Rate/Dose/Volume Action    12/08/22 1322 30 mL Given      Sedation Time   Sedation Time Physician-1: 20 minutes 52 seconds Contrast        Administrations occurring from 1246 to 1332 on 12/08/22:  Medication Name Total Dose  iohexol (OMNIPAQUE)  350 MG/ML injection 30 mL    Radiation/Fluoro   Fluoro time: 2.9 (min) DAP: 9227 (mGycm2) Cumulative Air Kerma: 169 (mGy) Coronary Findings   Diagnostic Dominance: Right Left Main  Vessel is angiographically normal. The left main has no obstructive disease. The vessel divides into the LAD and left circumflex.    Left Anterior Descending  There is mild diffuse disease throughout the vessel. The vessel is moderately calcified. The LAD has mild nonobstructive plaquing. The first diagonal is large in caliber with no stenosis. The second diagonal is patent with no stenosis. Vessel reaches the apex and supplies a collateral to the RCA. The proximal LAD is moderately calcified.    Left Circumflex  The circumflex is patent throughout. The OM branches are patent with no stenosis.    Right Coronary Artery  The proximal RCA is diffusely diseased in the mid vessel totally occludes, distally the vessel fills by left-to-right collaterals and is seen to fill all the way back to the mid RCA  Prox RCA lesion is 80% stenosed.  Mid RCA lesion is 100% stenosed.    Right Posterior Descending Artery  Collaterals  RPDA filled by collaterals from Dist LAD.       First Right Posterolateral Branch  Collaterals  1st RPL filled by collaterals from 2nd Sept.       Intervention    No interventions have been documented.    Left Heart   Aortic Valve The aortic valve is calcified. There is restricted aortic valve motion.    Coronary Diagrams   Diagnostic Dominance: Right  Intervention    Implants    No implant documentation for this case.    Syngo Images  Show images for CARDIAC CATHETERIZATION Images on Long Term Storage    Show images for [place], Pala to Procedure Log   Procedure Log    Link to Procedure Log   Procedure Log    Hemo Data   Flowsheet Row Most Recent Value  AO Systolic Pressure 125 mmHg  AO Diastolic Pressure 64 mmHg  AO Mean 86 mmHg      ADDENDUM  REPORT: 12/12/2022 15:01   CLINICAL DATA:  Severe Aortic Stenosis.   EXAM: Cardiac TAVR CT   TECHNIQUE: A non-contrast, gated CT scan was obtained with axial slices of 3 mm through the heart for aortic valve calcium scoring. A 120 kV retrospective, gated, contrast cardiac scan was obtained. Gantry rotation speed was 250 msecs and collimation was 0.6 mm. Nitroglycerin was not given. The 3D data set was reconstructed in 5% intervals of the 0-95% of the R-R cycle. Systolic and diastolic phases were analyzed on a dedicated workstation using MPR, MIP, and VRT modes. The patient received 100 cc of contrast.   FINDINGS: Image quality: Excellent.   Noise artifact is: Limited.   Valve Morphology: Tricuspid aortic valve with severe calcifications and severe thickening. Severely restricted leaflet movement in systole. Bulky calcification of the NCC.   Aortic Valve Calcium score: 2121   Aortic annular dimension:   Phase assessed: 25%   Annular area: 360 mm2   Annular perimeter: 68.4 mm   Max diameter: 24.3 mm   Min diameter: 18.9 mm   Annular and subannular calcification: None.   Membranous septum length: 7.0 mm   Optimal coplanar projection: RAO 2 CRA 1   Coronary Artery Height above Annulus:   Left Main: 10.7 mm   Right Coronary: 14.2 mm   Sinus of Valsalva Measurements:   Non-coronary: 27 mm   Right-coronary: 28 mm   Left-coronary: 28 mm   Sinus of Valsalva Height:   Non-coronary: 17.2 mm   Right-coronary: 18.1 mm   Left-coronary: 19.0 mm   Sinotubular Junction: 28 mm   Ascending Thoracic Aorta: 35 mm   Coronary Arteries: Normal coronary origin. Right dominance. The study was performed without use of NTG and is insufficient for plaque evaluation. Please refer to recent cardiac catheterization for coronary assessment. 3-vessel coronary calcifications noted.   Cardiac Morphology:   Right Atrium: Right atrial size is within normal limits.   Right  Ventricle: The right ventricular cavity is within normal limits.   Left Atrium: Left atrial size is normal in size with no left atrial appendage filling defect.   Left Ventricle: The ventricular cavity size is within normal limits.   Pulmonary arteries: Normal in size without proximal filling defect.   Pulmonary veins: Normal pulmonary venous drainage.   Pericardium: Normal thickness with no significant effusion or calcium present.   Mitral Valve: The mitral valve is normal structure without significant calcification.   Extra-cardiac findings: See attached radiology report for non-cardiac structures.   IMPRESSION: 1. Tricuspid aortic valve. Annular measurements support a 23 mm S3 or 26 mm Evolut Pro.   2. No significant annular or subannular calcifications.   3. Left main height 10.7 mm but no risk of obstruction with virtual valve.   4. Optimal Fluoroscopic Angle for Delivery: RAO 2 CRA 1   [place] T. Flora Lipps, MD     Electronically Signed   By: Lennie Odor M.D.   On: 12/12/2022 15:01     Narrative & Impression  CLINICAL DATA:  Aortic valve replacement (TAVR), pre-op eval. Aortic  valve stenosis.   EXAM: CT ANGIOGRAPHY CHEST, ABDOMEN AND PELVIS   TECHNIQUE: Multidetector CT imaging through the chest, abdomen and pelvis was performed using the standard protocol during bolus administration of intravenous contrast. Multiplanar reconstructed images and MIPs were obtained and reviewed to evaluate the vascular anatomy.   RADIATION DOSE REDUCTION: This exam was performed according to the departmental dose-optimization program which includes automated exposure control, adjustment of the mA and/or kV according to patient size and/or use of iterative reconstruction technique.   CONTRAST:  OMNIPAQUE IOHEXOL 350 MG/ML SOLN   COMPARISON:  01/24/2018 coronary CT.   FINDINGS: CTA CHEST FINDINGS   Cardiovascular: Normal heart size. No significant  pericardial effusion/thickening. Three-vessel coronary atherosclerosis. Diffusely coarsely calcified and thickened aortic valve. Atherosclerotic nonaneurysmal thoracic aorta. Normal caliber pulmonary arteries. No central pulmonary emboli.   Mediastinum/Nodes: No significant thyroid nodules. Unremarkable esophagus. No pathologically enlarged axillary, mediastinal or hilar lymph nodes.   Lungs/Pleura: No pneumothorax. No pleural effusion. Indistinct 0.5 cm left upper lobe pulmonary nodule on series 8/image 56: Stable from 01/24/2018 CT, considered benign. No acute consolidative airspace disease, lung masses or additional significant pulmonary nodules.   Musculoskeletal: No aggressive appearing focal osseous lesions. Moderate thoracic spondylosis.   CTA ABDOMEN AND PELVIS FINDINGS   Hepatobiliary: Normal liver with no liver mass. Cholecystectomy. No biliary ductal dilatation.   Pancreas: Normal, with no mass or duct dilation.   Spleen: Normal size. No mass.   Adrenals/Urinary Tract: Normal adrenals. Hypodense exophytic 1.8 cm posterior lower left renal cortical lesion (series 7/image 127). No additional contour deforming renal lesions. No hydronephrosis. Normal bladder.   Stomach/Bowel: Normal non-distended stomach. Normal caliber small bowel with no small bowel wall thickening. Normal appendix. Marked sigmoid diverticulosis with no large bowel wall thickening or significant pericolonic fat stranding.   Vascular/Lymphatic: Atherosclerotic nonaneurysmal abdominal aorta. High left retroperitoneal 2.2 x 1.9 cm solid mass abutting the left diaphragmatic crus and medial margin of the left adrenal gland (series 7/image 103), not previously imaged. Otherwise, no pathologically enlarged lymph nodes in the abdomen or pelvis.   Reproductive: Status post hysterectomy, with no abnormal findings at the vaginal cuff. No adnexal mass.   Other: No pneumoperitoneum, ascites or focal fluid  collection. Small fat containing umbilical hernia.   Musculoskeletal: No aggressive appearing focal osseous lesions. Moderate lumbar degenerative disc disease.   VASCULAR MEASUREMENTS PERTINENT TO TAVR:   AORTA:   Minimal Aortic Diameter-14.0 x 12.6 mm   Severity of Aortic Calcification-moderate   RIGHT PELVIS:   Right Common Iliac Artery -   Minimal Diameter-9.3 x 8.9 mm   Tortuosity-mild   Calcification-moderate to severe   Right External Iliac Artery -   Minimal Diameter-7.0 x 7.0 mm   Tortuosity-mild   Calcification-moderate   Right Common Femoral Artery -   Minimal Diameter-6.9 x 5.3 mm   Tortuosity-mild   Calcification-moderate   LEFT PELVIS:   Left Common Iliac Artery -   Minimal Diameter-10.4 x 9.6 mm   Tortuosity-mild   Calcification-severe   Left External Iliac Artery -   Minimal Diameter-7.0 x 6.0 mm   Tortuosity-mild   Calcification-moderate   Left Common Femoral Artery -   Minimal Diameter-5.8 x 5.1 mm   Tortuosity-mild   Calcification-moderate to severe   Review of the MIP images confirms the above findings.   IMPRESSION: 1. Vascular findings and measurements pertinent to potential TAVR procedure, as described. 2. Diffusely coarsely calcified and thickened aortic valve, compatible with reported aortic stenosis. 3. Indeterminate high left  retroperitoneal 2.2 x 1.9 cm solid mass abutting the left diaphragmatic crus and medial margin of the left adrenal gland, not previously imaged. Malignancy not excluded. Suggest further evaluation with PET-CT at this time. 4. Hypodense exophytic 1.8 cm posterior lower left renal cortical lesion, indeterminate, cannot exclude a solid renal neoplasm. MRI (preferred) or CT abdomen without and with IV contrast is indicated for further evaluation. 5. Three-vessel coronary atherosclerosis. 6. Marked sigmoid diverticulosis. 7.  Aortic Atherosclerosis (ICD10-I70.0).     Electronically  Signed   By: Delbert Phenix M.D.   On: 12/12/2022 12:33      Impression:   This 87 year old woman has stage D, severe, symptomatic aortic stenosis with NYHA class II symptoms of exertional fatigue and shortness of breath consistent with chronic diastolic congestive heart failure as well as frequent episodes of dizziness.  I have personally reviewed her 2D echo, cardiac catheterization, and CTA studies.  Her echo shows a severely calcified aortic valve with restricted leaflet mobility with a mean gradient of 42 mmHg and a valve area of 0.98 cm.  Left ventricular ejection fraction is normal.  Cardiac catheterization shows chronic total occlusion of the RCA which is collateralized from the left coronary artery.  There is otherwise no significant obstructive disease.  I agree that aortic valve replacement is indicated in this patient for relief of her symptoms and to prevent progressive left ventricular dysfunction.  Given her advanced age, limited mobility and frailty I think transcatheter aortic valve replacement would be the best option for treating her.  I do not think she would be a candidate for open surgical AVR.  Her gated cardiac CTA shows anatomy suitable for TAVR using a 26 mm Medtronic FX valve.  Her abdominal and pelvic CTA shows adequate pelvic vascular anatomy to allow transfemoral insertion.   The patient and her family were counseled at length regarding treatment alternatives for management of severe symptomatic aortic stenosis. The risks and benefits of surgical intervention has been discussed in detail. Long-term prognosis with medical therapy was discussed. Alternative approaches such as conventional surgical aortic valve replacement, transcatheter aortic valve replacement, and palliative medical therapy were compared and contrasted at length. This discussion was placed in the context of the patient's own specific clinical presentation and past medical history. All of their questions have  been addressed.    Following the decision to proceed with transcatheter aortic valve replacement, a discussion was held regarding what types of management strategies would be attempted intraoperatively in the event of life-threatening complications.  I do not think she is a candidate for emergent sternotomy to manage any intraoperative complications.  The patient has been advised of a variety of complications that might develop including but not limited to risks of death, stroke, paravalvular leak, aortic dissection or other major vascular complications, aortic annulus rupture, device embolization, cardiac rupture or perforation, mitral regurgitation, acute myocardial infarction, arrhythmia, heart block or bradycardia requiring permanent pacemaker placement, congestive heart failure, respiratory failure, renal failure, pneumonia, infection, other late complications related to structural valve deterioration or migration, or other complications that might ultimately cause a temporary or permanent loss of functional independence or other long term morbidity. The patient provides full informed consent for the procedure as described and all questions were answered.       Plan:   Transfemoral TAVR using a Medtronic valve.        Alleen Borne, MD

## 2023-01-02 NOTE — Anesthesia Preprocedure Evaluation (Addendum)
Anesthesia Evaluation  Patient identified by MRN, date of birth, ID band Patient awake    Reviewed: Allergy & Precautions, NPO status , Patient's Chart, lab work & pertinent test results  History of Anesthesia Complications (+) PONV and history of anesthetic complications  Airway Mallampati: III  TM Distance: >3 FB Neck ROM: Full    Dental  (+) Dental Advisory Given, Teeth Intact, Caps,    Pulmonary former smoker   Pulmonary exam normal breath sounds clear to auscultation       Cardiovascular + Valvular Problems/Murmurs AS  Rhythm:Regular Rate:Normal + Systolic murmurs Echo   1. Aortic stenosis is now severe.   2. Left ventricular ejection fraction, by estimation, is 60 to 65%. The left ventricle has normal function. The left ventricle has no regional wall motion abnormalities. There is moderate left ventricular hypertrophy. Left ventricular diastolic parameters are consistent with Grade I diastolic dysfunction (impaired relaxation).   3. Right ventricular systolic function is normal. The right ventricular size is normal.   4. The mitral valve is normal in structure. Trivial mitral valve regurgitation. No evidence of mitral stenosis.   5. The aortic valve is calcified. There is severe calcifcation of the aortic valve. There is severe thickening of the aortic valve. Aortic valve regurgitation is not visualized. Severe aortic valve stenosis. Aortic valve area, by VTI measures 0.98 cm. Aortic valve mean gradient measures 42.0 mmHg. Aortic valve Vmax measures 4.25 m/s.   6. The inferior vena cava is normal in size with greater than 50% respiratory variability, suggesting right atrial pressure of 3 mmHg.   Comparison(s): Prior images reviewed side by side. Changes from prior study are noted. 09/07/2021 - mean AV gradient 32 mmHg.      Neuro/Psych    GI/Hepatic ,GERD  ,,  Endo/Other  Hypothyroidism Hyperthyroidism   Renal/GU       Musculoskeletal  (+) Arthritis ,    Abdominal  (+) + obese  Peds  Hematology   Anesthesia Other Findings   Reproductive/Obstetrics                             Anesthesia Physical Anesthesia Plan  ASA: 4  Anesthesia Plan: MAC   Post-op Pain Management: Tylenol PO (pre-op)* and Minimal or no pain anticipated   Induction: Intravenous  PONV Risk Score and Plan: 3 and Ondansetron, Dexamethasone and Treatment may vary due to age or medical condition  Airway Management Planned: Natural Airway  Additional Equipment: Arterial line  Intra-op Plan:   Post-operative Plan:   Informed Consent: I have reviewed the patients History and Physical, chart, labs and discussed the procedure including the risks, benefits and alternatives for the proposed anesthesia with the patient or authorized representative who has indicated his/her understanding and acceptance.     Dental advisory given  Plan Discussed with: CRNA  Anesthesia Plan Comments:         Anesthesia Quick Evaluation

## 2023-01-03 ENCOUNTER — Inpatient Hospital Stay (HOSPITAL_COMMUNITY)
Admission: RE | Admit: 2023-01-03 | Discharge: 2023-01-04 | DRG: 267 | Disposition: A | Discharge status: Home / Self Care | Payer: Medicare Other | Attending: Cardiovascular Disease | Admitting: Cardiovascular Disease

## 2023-01-03 ENCOUNTER — Encounter (HOSPITAL_COMMUNITY): Payer: Self-pay | Admitting: Cardiovascular Disease

## 2023-01-03 ENCOUNTER — Inpatient Hospital Stay (HOSPITAL_COMMUNITY): Payer: Medicare Other

## 2023-01-03 ENCOUNTER — Inpatient Hospital Stay (HOSPITAL_COMMUNITY): Payer: Self-pay | Admitting: Certified Registered Nurse Anesthetist

## 2023-01-03 ENCOUNTER — Encounter (HOSPITAL_COMMUNITY)
Admission: RE | Disposition: A | Discharge status: Home / Self Care | Payer: Self-pay | Source: Home / Self Care | Attending: Cardiovascular Disease

## 2023-01-03 ENCOUNTER — Inpatient Hospital Stay (HOSPITAL_COMMUNITY): Payer: Medicare Other | Admitting: Physician Assistant

## 2023-01-03 ENCOUNTER — Other Ambulatory Visit: Payer: Self-pay

## 2023-01-03 DIAGNOSIS — Z808 Family history of malignant neoplasm of other organs or systems: Secondary | ICD-10-CM

## 2023-01-03 DIAGNOSIS — M810 Age-related osteoporosis without current pathological fracture: Secondary | ICD-10-CM | POA: Diagnosis present

## 2023-01-03 DIAGNOSIS — E0590 Thyrotoxicosis, unspecified without thyrotoxic crisis or storm: Secondary | ICD-10-CM | POA: Diagnosis present

## 2023-01-03 DIAGNOSIS — I350 Nonrheumatic aortic (valve) stenosis: Secondary | ICD-10-CM

## 2023-01-03 DIAGNOSIS — H3530 Unspecified macular degeneration: Secondary | ICD-10-CM | POA: Diagnosis present

## 2023-01-03 DIAGNOSIS — I10 Essential (primary) hypertension: Secondary | ICD-10-CM | POA: Diagnosis present

## 2023-01-03 DIAGNOSIS — Z79899 Other long term (current) drug therapy: Secondary | ICD-10-CM

## 2023-01-03 DIAGNOSIS — I2582 Chronic total occlusion of coronary artery: Secondary | ICD-10-CM | POA: Diagnosis present

## 2023-01-03 DIAGNOSIS — N289 Disorder of kidney and ureter, unspecified: Secondary | ICD-10-CM | POA: Diagnosis present

## 2023-01-03 DIAGNOSIS — I440 Atrioventricular block, first degree: Secondary | ICD-10-CM | POA: Diagnosis present

## 2023-01-03 DIAGNOSIS — Z952 Presence of prosthetic heart valve: Principal | ICD-10-CM

## 2023-01-03 DIAGNOSIS — Z885 Allergy status to narcotic agent status: Secondary | ICD-10-CM | POA: Diagnosis not present

## 2023-01-03 DIAGNOSIS — E785 Hyperlipidemia, unspecified: Secondary | ICD-10-CM | POA: Diagnosis present

## 2023-01-03 DIAGNOSIS — Z882 Allergy status to sulfonamides status: Secondary | ICD-10-CM | POA: Diagnosis not present

## 2023-01-03 DIAGNOSIS — Z6836 Body mass index (BMI) 36.0-36.9, adult: Secondary | ICD-10-CM | POA: Diagnosis not present

## 2023-01-03 DIAGNOSIS — Z888 Allergy status to other drugs, medicaments and biological substances status: Secondary | ICD-10-CM | POA: Diagnosis not present

## 2023-01-03 DIAGNOSIS — E039 Hypothyroidism, unspecified: Secondary | ICD-10-CM

## 2023-01-03 DIAGNOSIS — Z96653 Presence of artificial knee joint, bilateral: Secondary | ICD-10-CM | POA: Diagnosis present

## 2023-01-03 DIAGNOSIS — M069 Rheumatoid arthritis, unspecified: Secondary | ICD-10-CM | POA: Insufficient documentation

## 2023-01-03 DIAGNOSIS — Z006 Encounter for examination for normal comparison and control in clinical research program: Secondary | ICD-10-CM

## 2023-01-03 DIAGNOSIS — D696 Thrombocytopenia, unspecified: Secondary | ICD-10-CM | POA: Diagnosis present

## 2023-01-03 DIAGNOSIS — I2510 Atherosclerotic heart disease of native coronary artery without angina pectoris: Secondary | ICD-10-CM | POA: Diagnosis present

## 2023-01-03 DIAGNOSIS — Z6838 Body mass index (BMI) 38.0-38.9, adult: Secondary | ICD-10-CM | POA: Diagnosis not present

## 2023-01-03 DIAGNOSIS — Z79818 Long term (current) use of other agents affecting estrogen receptors and estrogen levels: Secondary | ICD-10-CM

## 2023-01-03 DIAGNOSIS — R1909 Other intra-abdominal and pelvic swelling, mass and lump: Secondary | ICD-10-CM | POA: Diagnosis present

## 2023-01-03 DIAGNOSIS — Z79620 Long term (current) use of immunosuppressive biologic: Secondary | ICD-10-CM

## 2023-01-03 DIAGNOSIS — K219 Gastro-esophageal reflux disease without esophagitis: Secondary | ICD-10-CM | POA: Diagnosis present

## 2023-01-03 DIAGNOSIS — Z801 Family history of malignant neoplasm of trachea, bronchus and lung: Secondary | ICD-10-CM

## 2023-01-03 DIAGNOSIS — Z8261 Family history of arthritis: Secondary | ICD-10-CM

## 2023-01-03 DIAGNOSIS — Z87891 Personal history of nicotine dependence: Secondary | ICD-10-CM | POA: Diagnosis not present

## 2023-01-03 DIAGNOSIS — Z7982 Long term (current) use of aspirin: Secondary | ICD-10-CM

## 2023-01-03 HISTORY — PX: INTRAOPERATIVE TRANSTHORACIC ECHOCARDIOGRAM: SHX6523

## 2023-01-03 HISTORY — DX: Morbid (severe) obesity due to excess calories: E66.01

## 2023-01-03 HISTORY — DX: Presence of prosthetic heart valve: Z95.2

## 2023-01-03 LAB — POCT I-STAT, CHEM 8
BUN: 12 mg/dL (ref 8–23)
Calcium, Ion: 1.19 mmol/L (ref 1.15–1.40)
Chloride: 103 mmol/L (ref 98–111)
Creatinine, Ser: 0.4 mg/dL — ABNORMAL LOW (ref 0.44–1.00)
Glucose, Bld: 114 mg/dL — ABNORMAL HIGH (ref 70–99)
HCT: 34 % — ABNORMAL LOW (ref 36.0–46.0)
Hemoglobin: 11.6 g/dL — ABNORMAL LOW (ref 12.0–15.0)
Potassium: 4 mmol/L (ref 3.5–5.1)
Sodium: 141 mmol/L (ref 135–145)
TCO2: 28 mmol/L (ref 22–32)

## 2023-01-03 LAB — ECHOCARDIOGRAM LIMITED
AR max vel: 1.73 cm2
AV Area VTI: 1.69 cm2
AV Area mean vel: 1.71 cm2
AV Mean grad: 7 mm[Hg]
AV Peak grad: 13 mm[Hg]
Ao pk vel: 1.8 m/s
Area-P 1/2: 2.76 cm2
S' Lateral: 2.6 cm

## 2023-01-03 LAB — GLUCOSE, CAPILLARY
Glucose-Capillary: 87 mg/dL (ref 70–99)
Glucose-Capillary: 129 mg/dL — ABNORMAL HIGH (ref 70–99)

## 2023-01-03 LAB — POCT ACTIVATED CLOTTING TIME: Activated Clotting Time: 360 s

## 2023-01-03 SURGERY — TRANSCATHETER AORTIC VALVE REPLACEMENT, TRANSFEMORAL (CATHLAB)
Anesthesia: Monitor Anesthesia Care

## 2023-01-03 MED ORDER — FENTANYL CITRATE (PF) 100 MCG/2ML IJ SOLN
INTRAMUSCULAR | Status: DC | PRN
  Administered 2023-01-03 (×4): 25 ug via INTRAVENOUS

## 2023-01-03 MED ORDER — FENTANYL CITRATE (PF) 100 MCG/2ML IJ SOLN
INTRAMUSCULAR | Status: AC
  Filled 2023-01-03: qty 2

## 2023-01-03 MED ORDER — CHLORHEXIDINE GLUCONATE 0.12 % MT SOLN: 15.0000 mL | Freq: Once | OROMUCOSAL | Status: AC

## 2023-01-03 MED ORDER — SODIUM CHLORIDE 0.9 % IV SOLN: INTRAVENOUS | Status: DC | PRN

## 2023-01-03 MED ORDER — LIDOCAINE HCL (PF) 1 % IJ SOLN
INTRAMUSCULAR | Status: DC | PRN
  Administered 2023-01-03: 15 mL
  Administered 2023-01-03: 10 mL

## 2023-01-03 MED ORDER — IODIXANOL 320 MG/ML IV SOLN
INTRAVENOUS | Status: DC | PRN
  Administered 2023-01-03: 40 mL

## 2023-01-03 MED ORDER — ONDANSETRON HCL 4 MG/2ML IJ SOLN
4.0000 mg | Freq: Four times a day (QID) | INTRAMUSCULAR | Status: DC | PRN
  Administered 2023-01-04: 4 mg via INTRAVENOUS
  Filled 2023-01-03: qty 2

## 2023-01-03 MED ORDER — SODIUM CHLORIDE 0.9 % IV SOLN: INTRAVENOUS | Status: DC

## 2023-01-03 MED ORDER — SODIUM CHLORIDE 0.9 % IV SOLN: 250.0000 mL | INTRAVENOUS | Status: DC | PRN

## 2023-01-03 MED ORDER — CEFAZOLIN SODIUM-DEXTROSE 2-4 GM/100ML-% IV SOLN
2.0000 g | Freq: Three times a day (TID) | INTRAVENOUS | Status: AC
  Administered 2023-01-03 – 2023-01-04 (×2): 2 g via INTRAVENOUS
  Filled 2023-01-03 (×2): qty 100

## 2023-01-03 MED ORDER — ACETAMINOPHEN 650 MG RE SUPP: 650.0000 mg | Freq: Four times a day (QID) | RECTAL | Status: DC | PRN

## 2023-01-03 MED ORDER — ASPIRIN 81 MG PO TBEC
81.0000 mg | DELAYED_RELEASE_TABLET | Freq: Every day | ORAL | Status: DC
  Administered 2023-01-04: 81 mg via ORAL
  Filled 2023-01-03 (×2): qty 1

## 2023-01-03 MED ORDER — CHLORHEXIDINE GLUCONATE 4 % EX SOLN: 60.0000 mL | Freq: Once | CUTANEOUS | Status: DC

## 2023-01-03 MED ORDER — TRAMADOL HCL 50 MG PO TABS
25.0000 mg | ORAL_TABLET | Freq: Every day | ORAL | Status: DC
  Administered 2023-01-03 – 2023-01-04 (×2): 25 mg via ORAL
  Filled 2023-01-03 (×2): qty 1

## 2023-01-03 MED ORDER — OXYCODONE HCL 5 MG PO TABS
5.0000 mg | ORAL_TABLET | ORAL | Status: DC | PRN
  Administered 2023-01-03: 10 mg via ORAL
  Administered 2023-01-04: 5 mg via ORAL
  Filled 2023-01-03: qty 1
  Filled 2023-01-03: qty 2

## 2023-01-03 MED ORDER — ACETAMINOPHEN 500 MG PO TABS
ORAL_TABLET | ORAL | Status: AC
  Administered 2023-01-03: 1000 mg via ORAL
  Filled 2023-01-03: qty 2

## 2023-01-03 MED ORDER — ROSUVASTATIN CALCIUM 20 MG PO TABS
20.0000 mg | ORAL_TABLET | Freq: Every day | ORAL | Status: DC
  Administered 2023-01-03 – 2023-01-04 (×2): 20 mg via ORAL
  Filled 2023-01-03 (×2): qty 1

## 2023-01-03 MED ORDER — NITROGLYCERIN IN D5W 200-5 MCG/ML-% IV SOLN
0.0000 ug/min | INTRAVENOUS | Status: DC
  Filled 2023-01-03: qty 250

## 2023-01-03 MED ORDER — SODIUM CHLORIDE 0.9% FLUSH
3.0000 mL | Freq: Two times a day (BID) | INTRAVENOUS | Status: DC
  Administered 2023-01-03: 3 mL via INTRAVENOUS

## 2023-01-03 MED ORDER — CHLORHEXIDINE GLUCONATE 0.12 % MT SOLN
OROMUCOSAL | Status: AC
  Administered 2023-01-03: 15 mL via OROMUCOSAL
  Filled 2023-01-03: qty 15

## 2023-01-03 MED ORDER — SODIUM CHLORIDE 0.9 % IV SOLN
INTRAVENOUS | Status: AC
Start: 2023-01-03 — End: 2023-01-03

## 2023-01-03 MED ORDER — MIDAZOLAM HCL 2 MG/2ML IJ SOLN
INTRAMUSCULAR | Status: AC
Start: 2023-01-03 — End: ?
  Filled 2023-01-03: qty 2

## 2023-01-03 MED ORDER — PROTAMINE SULFATE 10 MG/ML IV SOLN
INTRAVENOUS | Status: DC | PRN
  Administered 2023-01-03: 150 mg via INTRAVENOUS

## 2023-01-03 MED ORDER — CHLORHEXIDINE GLUCONATE 4 % EX SOLN
30.0000 mL | CUTANEOUS | Status: DC
Start: 2023-01-03 — End: 2023-01-03
  Filled 2023-01-03: qty 30

## 2023-01-03 MED ORDER — ACETAMINOPHEN 500 MG PO TABS: 1000.0000 mg | ORAL_TABLET | Freq: Once | ORAL | Status: AC

## 2023-01-03 MED ORDER — ACETAMINOPHEN 325 MG PO TABS: 650.0000 mg | ORAL_TABLET | Freq: Four times a day (QID) | ORAL | Status: DC | PRN

## 2023-01-03 MED ORDER — MIDAZOLAM HCL 2 MG/2ML IJ SOLN
INTRAMUSCULAR | Status: DC | PRN
  Administered 2023-01-03 (×4): .5 mg via INTRAVENOUS

## 2023-01-03 MED ORDER — SODIUM CHLORIDE 0.9% FLUSH: 3.0000 mL | INTRAVENOUS | Status: DC | PRN

## 2023-01-03 MED ORDER — HEPARIN SODIUM (PORCINE) 1000 UNIT/ML IJ SOLN
INTRAMUSCULAR | Status: DC | PRN
  Administered 2023-01-03: 15000 [IU] via INTRAVENOUS

## 2023-01-03 MED ORDER — CHLORHEXIDINE GLUCONATE 4 % EX SOLN
60.0000 mL | Freq: Once | CUTANEOUS | Status: DC
Start: 2023-01-03 — End: 2023-01-03
  Filled 2023-01-03: qty 60

## 2023-01-03 SURGICAL SUPPLY — 29 items
BAG SNAP BAND KOVER 36X36 (MISCELLANEOUS) ×2
BALLN TRUE 18X4.5 (BALLOONS) ×1
CABLE ADAPT PACING TEMP 12FT (ADAPTER) ×1
CATH DIAG 6FR PIGTAIL ANGLED (CATHETERS) ×1
CATH INFINITI 5 FR STR PIGTAIL (CATHETERS) ×1
CATH INFINITI 5FR ANG PIGTAIL (CATHETERS) ×1
CATH INFINITI 6F AL1 (CATHETERS) ×1
CATH S G BIP PACING (CATHETERS) ×1
CLOSURE MYNX CONTROL 6F/7F (Vascular Products) ×1 IMPLANT
CLOSURE PERCLOSE PROSTYLE (VASCULAR PRODUCTS) ×2
ELECT DEFIB PAD ADLT CADENCE (PAD) ×1
KIT MICROPUNCTURE NIT STIFF (SHEATH) ×1
PACK CARDIAC CATHETERIZATION (CUSTOM PROCEDURE TRAY) ×1
PAD SORBX EP SHIELD 16.5X12 (MISCELLANEOUS) ×1
SHEATH BRITE TIP 7FR 35CM (SHEATH) ×1
SHEATH PERFORMER 18FRX30 (VASCULAR PRODUCTS) ×1
SHEATH PINNACLE 6F 10CM (SHEATH) ×1
SHEATH PINNACLE 8F 10CM (SHEATH) ×1
SHEATH PROBE COVER 6X72 (BAG) ×1
SHIELD CATH-GARD CONTAMINATION (MISCELLANEOUS) ×1
STOPCOCK MORSE 400PSI 3WAY (MISCELLANEOUS) ×2
SYS EVOLUT FX DELIVERY 23-29 (CATHETERS) ×1
SYS EVOLUT FX LOADING 23-29 (CATHETERS) ×1
VALVE EVOLUT FX 26 (Valve) ×1 IMPLANT
WIRE AMPLATZ SS-J .035X180CM (WIRE) ×1
WIRE EMERALD 3MM-J .035X150CM (WIRE) ×1
WIRE EMERALD 3MM-J .035X260CM (WIRE) ×1
WIRE EMERALD ST .035X260CM (WIRE) ×1
WIRE SAFARI SM CURVE 275 (WIRE) ×1

## 2023-01-03 NOTE — Anesthesia Postprocedure Evaluation (Signed)
Anesthesia Post Note  Patient: [name]  Procedure(s) Performed: Transcatheter Aortic Valve Replacement, Transfemoral INTRAOPERATIVE TRANSTHORACIC ECHOCARDIOGRAM     Patient location during evaluation: PACU Anesthesia Type: MAC Level of consciousness: awake and alert Pain management: pain level controlled Vital Signs Assessment: post-procedure vital signs reviewed and stable Respiratory status: spontaneous breathing Cardiovascular status: stable Anesthetic complications: no   There were no known notable events for this encounter.  Last Vitals:  Vitals:   01/03/23 1615 01/03/23 1630  BP: 119/74 129/60  Pulse: 62 (!) 57  Resp: 13 15  Temp:    SpO2: 98% 97%    Last Pain:  Vitals:   01/03/23 1601  TempSrc:   PainSc: 0-No pain                 Lewie Loron

## 2023-01-03 NOTE — Progress Notes (Addendum)
Pt admitted to rm 3 from cath lab. Initiated tele. VSS. Call bell within reach. Per report, bed rest started at 230pm  Lawson Radar, RN

## 2023-01-03 NOTE — Discharge Summary (Incomplete)
[facility]   MULTIDISCIPLINARY HEART VALVE TEAM  Discharge Summary    Patient ID: JACOBY BEIRNE MRN: [id number]; DOB: [birth year]  Admit date: 01/03/2023 Discharge date: 01/04/2023  Primary Care Provider: Rodrigo Ran, MD  Primary Cardiologist: Nicki Guadalajara, MD / Dr. Excell Seltzer & Dr. Laneta Simmers (TAVR)--> pt wants to change to Dr. Excell Seltzer with Dr. Landry Dyke retirement.   Discharge Diagnoses    Principal Problem:   S/P TAVR (transcatheter aortic valve replacement) Active Problems:   Severe aortic stenosis   Hypothyroidism   Hyperthyroidism   Hyperlipidemia   GERD (gastroesophageal reflux disease)   Rheumatoid arthritis (HCC)   Morbid obesity (HCC)   Allergies Allergies  Allergen Reactions   Atorvastatin     hands hurt   Morphine And Codeine Other (See Comments)    "makes me queezy"   Sulfa Antibiotics Hives    Hives as child-age 64    Diagnostic Studies/Procedures    TAVR OPERATIVE NOTE    Date of Procedure:                 01/03/2023   Preoperative Diagnosis:      Severe Aortic Stenosis    Postoperative Diagnosis:    Same    Procedure:        Transcatheter Aortic Valve Replacement - Percutaneous Right Transfemoral Approach             Medtronic Evolut FX  (size 26 mm, serial # [id number])              Co-Surgeons:            Alleen Borne, MD and Tonny Bollman, MD     Anesthesiologist:                  Jolyne Loa, MD   Echocardiographer:              Lacretia Nicks. O'Neal, MD   Pre-operative Echo Findings: Severe aortic stenosis  Normal left ventricular systolic function   Post-operative Echo Findings: Trace paravalvular leak Normal left ventricular systolic function _____________    Echo 01/04/23: completed but pending formal read at the time of discharge   History of Present Illness     CHARIE SUBIA is a 87 y.o. female with a history of HLD, morbid obesity (BMI 36), 1st degree AV block, rheumatoid arthritis and severe aortic stenosis who  presented to [facility] on 01/03/23 for planned TAVR.   She has been followed over time for aortic stenosis. She recently developed fatigue and dizziness. Echo 09/20/22 showed EF 60% and severe AS with a mean grad 42 mmHg, AVA 0.98 cm2. [facility] 12/08/22 showed chronic total occlusion of the RCA, collateralized by the left coronary artery as well a patent left main, LAD, and left circumflex with mild nonobstructive plaquing. Medical therapy recommended given lack of exertional angina  The patient was evaluated by the multidisciplinary valve team and felt to have severe, symptomatic aortic stenosis and to be a suitable candidate for TAVR, which was set up for 01/03/23.   Hospital Course     Consultants: none  Severe AS: s/p successful TAVR with a 26 Medtronic FX THV via the TF approach on 01/03/23. Post operative echo completed but pending formal read. Groin sites are stable but required lido/epi/silver nitrate due to persistent oozing. Started on a baby Asprin 81mg  daily (computer flagged a potential interaction with methotrexate but discussed with pharmacy and it should be okay). Walked with cardiac rehab with no issues. Plan  for discharge home today with close follow up in the outpatient setting.   1st deg AV block: pt had a preexisting 1st deg AV block with a PR ~316. She had intermittent junctional rhythm after TAVR and ECG today shows prolongation of PR to . Will discharge home with a Zio AT to r/o late presenting HAVB.    HTN: BP well controlled. Resume home meds.   HLD: resume home Crestor 20mg  daily  RA: resumed home methotrexate and Actemra  Thrombocytopenia: plt down to 68. Likely reactive. Will recheck CBC next week in office.   Retroperitoneal mass: pre TAVR CT showed an "indeterminate high left retroperitoneal 2.2 x 1.9 cm solid mass abutting the left diaphragmatic crus and medial margin of the left adrenal gland, not previously imaged. Malignancy not excluded." Follo up PET-CT 11/19 did  not show any malignancy.   Renal lesion: pre TAVR CT showed a "hypodense exophytic 1.8 cm posterior lower left renal cortical lesion, indeterminate, cannot exclude a solid renal neoplasm. MRI (preferred) or CT abdomen without and with IV contrast is indicated for further evaluation." This will be discussed in the outpatient setting.  _____________  Discharge Vitals Blood pressure (!) 120/55, pulse 74, temperature (!) 97.5 F (36.4 C), temperature source Oral, resp. rate 17, height 5\' 2"  (1.575 m), weight 101.5 kg, SpO2 96%.  Filed Weights   01/03/23 0911 01/04/23 0411  Weight: 94.3 kg 101.5 kg     GEN: Well nourished, well developed, in no acute distress HEENT: normal Neck: no JVD or masses Cardiac: RRR; no murmurs, rubs, or gallops,no edema  Respiratory:  clear to auscultation bilaterally, normal work of breathing GI: soft, nontender, nondistended, + BS MS: no deformity or atrophy Skin: warm and dry, no rash.  Groin sites clear without hematoma or ecchymosis  Neuro:  Alert and Oriented x 3, Strength and sensation are intact Psych: euthymic mood, full affect    Disposition   Pt is being discharged home today in good condition.  Follow-up Plans & Appointments     Follow-up Information     Janetta Hora, PA-C. Go on 01/13/2023.   Specialties: Cardiology, Radiology Why: @ 9:25am, please arrive 15 minutes early Contact information: [address] [place] [address] [phone]                Discharge Instructions     Amb Referral to Cardiac Rehabilitation   Complete by: As directed    Diagnosis: Valve Replacement   Valve: Aortic   After initial evaluation and assessments completed: Virtual Based Care may be provided alone or in conjunction with Phase 2 Cardiac Rehab based on patient barriers.: Yes   Intensive Cardiac Rehabilitation (ICR) [facility] location only OR Traditional Cardiac Rehabilitation (TCR) *If criteria for ICR are not met will  enroll in TCR [facility] only): Yes       Discharge Medications   Allergies as of 01/04/2023       Reactions   Atorvastatin    hands hurt   Morphine And Codeine Other (See Comments)   "makes me queezy"   Sulfa Antibiotics Hives   Hives as child-age 84        Medication List     TAKE these medications    acetaminophen 500 MG tablet Commonly known as: TYLENOL Take 500 mg by mouth every 6 (six) hours as needed for headache.   ascorbic acid 500 MG tablet Commonly known as: VITAMIN C Take 500 mg by mouth every other day.  aspirin EC 81 MG tablet Take 1 tablet (81 mg total) by mouth daily. Swallow whole.   diphenhydrAMINE 50 MG/ML injection Commonly known as: BENADRYL 50 mg Injection with infusions   Benadryl Allergy 25 MG tablet Generic drug: diphenhydrAMINE Take 12.5 mg by mouth 2 (two) times daily.   estrogens (conjugated) 0.3 MG tablet Commonly known as: PREMARIN Take 0.3 mg by mouth daily with breakfast.   folic acid 1 MG tablet Commonly known as: FOLVITE Take 1 mg by mouth daily with breakfast.   METAMUCIL 3 IN 1 DAILY FIBER PO Take 1-2 capsules by mouth daily.   methotrexate 2.5 MG tablet Commonly known as: RHEUMATREX Take 7.5 mg by mouth 2 (two) times a week. Caution:Chemotherapy. Protect from light. On Saturdays and Sundays   OVER THE COUNTER MEDICATION Take 1 tablet by mouth daily. [employer] supplement   PRESERVISION AREDS 2 PO Take 1 capsule by mouth 2 (two) times daily.   Restasis 0.05 % ophthalmic emulsion Generic drug: cycloSPORINE Place 1 drop into both eyes 2 (two) times daily.   rosuvastatin 20 MG tablet Commonly known as: CRESTOR Take 20 mg by mouth daily.   saccharomyces boulardii 250 MG capsule Commonly known as: FLORASTOR Take 250 mg by mouth every other day.   SYSTANE BALANCE OP Place 1 drop into both eyes 2 (two) times daily as needed (dry eyes).   tocilizumab 400 MG/20ML Soln injection Commonly known as: ACTEMRA Inject  into the vein every 30 (thirty) days.   traMADol 50 MG tablet Commonly known as: ULTRAM Take 1-2 tablets (50-100 mg total) by mouth every 6 (six) hours as needed. What changed:  how much to take when to take this   Vitamin D3 250 MCG (10000 UT) capsule Take 10,000 Units by mouth every Monday, Wednesday, and Friday.          Outstanding Labs/Studies   CBC  Duration of Discharge Encounter   Greater than 30 minutes including physician time.  Byrd Hesselbach, PA-C 01/04/2023, 10:08 AM [phone]  Patient seen, examined. Available data reviewed. Agree with findings, assessment, and plan as outlined by Carlean Jews, PA-C.  The patient is independently interviewed and examined.  She is alert, oriented, in no distress.  HEENT is normal, JVP is normal, lungs are clear bilaterally, heart is regular rate and rhythm with a soft 2/6 ejection murmur at the right upper sternal border, no diastolic murmur, abdomen is soft and nontender, bilateral groin sites are clear with no hematoma or ecchymosis, no lower extremity edema.  Telemetry is reviewed and shows sinus rhythm with prolonged first-degree AV block.  Echo images are reviewed and shows vigorous LV systolic function with LVEF greater than 65%, normal function of her TAVR bioprosthesis with no paravalvular regurgitation and a mean gradient of 9 mmHg.  The patient is medically stable for hospital discharge today.  We will discharge her with an outpatient live telemetry monitor to rule out any high-grade heart block over the ensuing weeks.  Patient has follow-up scheduled next week.  All of their questions are answered.  Family is at the bedside.  Otherwise as above.  Tonny Bollman, M.D. 01/04/2023 12:28 PM

## 2023-01-03 NOTE — Op Note (Addendum)
[facility]   MULTIDISCIPLINARY HEART VALVE TEAM     TAVR OPERATIVE NOTE   Sunset Rokicki [id number]  Date of Procedure:                 01/03/2023   Preoperative Diagnosis:      Severe Aortic Stenosis    Postoperative Diagnosis:    Same    Procedure:       Transcatheter Aortic Valve Replacement - Percutaneous Right Transfemoral Approach             Medtronic Evolut FX  (size 26 mm, serial # [id number])              Co-Surgeons:            Alleen Borne, MD and Tonny Bollman, MD     Anesthesiologist:                  Jolyne Loa, MD   Echocardiographer:              Lacretia Nicks. O'Neal, MD   Pre-operative Echo Findings: Severe aortic stenosis  Normal left ventricular systolic function   Post-operative Echo Findings: Trace paravalvular leak Normal left ventricular systolic function      BRIEF CLINICAL NOTE AND INDICATIONS FOR SURGERY    This 87 year old woman has stage D, severe, symptomatic aortic stenosis with NYHA class II symptoms of exertional fatigue and shortness of breath consistent with chronic diastolic congestive heart failure as well as frequent episodes of dizziness.  I have personally reviewed her 2D echo, cardiac catheterization, and CTA studies.  Her echo shows a severely calcified aortic valve with restricted leaflet mobility with a mean gradient of 42 mmHg and a valve area of 0.98 cm.  Left ventricular ejection fraction is normal.  Cardiac catheterization shows chronic total occlusion of the RCA which is collateralized from the left coronary artery.  There is otherwise no significant obstructive disease.  I agree that aortic valve replacement is indicated in this patient for relief of her symptoms and to prevent progressive left ventricular dysfunction.  Given her advanced age, limited mobility and frailty I think transcatheter aortic valve replacement would be the best option for treating her.  I do not think she would be a candidate for open  surgical AVR.  Her gated cardiac CTA shows anatomy suitable for TAVR using a 26 mm Medtronic FX valve.  Her abdominal and pelvic CTA shows adequate pelvic vascular anatomy to allow transfemoral insertion.   The patient and her family were counseled at length regarding treatment alternatives for management of severe symptomatic aortic stenosis. The risks and benefits of surgical intervention has been discussed in detail. Long-term prognosis with medical therapy was discussed. Alternative approaches such as conventional surgical aortic valve replacement, transcatheter aortic valve replacement, and palliative medical therapy were compared and contrasted at length. This discussion was placed in the context of the patient's own specific clinical presentation and past medical history. All of their questions have been addressed.    Following the decision to proceed with transcatheter aortic valve replacement, a discussion was held regarding what types of management strategies would be attempted intraoperatively in the event of life-threatening complications.  I do not think she is a candidate for emergent sternotomy to manage any intraoperative complications.  The patient has been advised of a variety of complications that might develop including but not limited to risks of death, stroke, paravalvular leak, aortic dissection or other major vascular complications,  aortic annulus rupture, device embolization, cardiac rupture or perforation, mitral regurgitation, acute myocardial infarction, arrhythmia, heart block or bradycardia requiring permanent pacemaker placement, congestive heart failure, respiratory failure, renal failure, pneumonia, infection, other late complications related to structural valve deterioration or migration, or other complications that might ultimately cause a temporary or permanent loss of functional independence or other long term morbidity. The patient provides full informed consent for the  procedure as described and all questions were answered.           DETAILS OF THE OPERATIVE PROCEDURE    PREPARATION:    The patient was brought to the operating room on the above mentioned date and appropriate monitoring was established by the anesthesia team. The patient was placed in the supine position on the operating table.  Intravenous antibiotics were administered. The patient was monitored closely throughout the procedure under conscious sedation.    Baseline transthoracic echocardiogram was performed. The patient's abdomen and both groins were prepped and draped in a sterile manner. A time out procedure was performed.   PERIPHERAL ACCESS:    Using the modified Seldinger technique, femoral arterial and venous access was obtained with placement of 6 Fr sheaths on the left side.  A pigtail diagnostic catheter was passed through the left arterial sheath under fluoroscopic guidance into the aortic root.  A temporary transvenous pacemaker catheter was passed through the left femoral venous sheath under fluoroscopic guidance into the right ventricle.  The pacemaker was tested to ensure stable lead placement and pacemaker capture. Aortic root angiography was performed in order to determine the optimal angiographic angle for valve deployment.    TRANSFEMORAL ACCESS:    Percutaneous transfemoral access and sheath placement was performed using ultrasound guidance.  The right common femoral artery was cannulated using a micropuncture needle.  A pair of Abbott Perclose percutaneous closure devices were placed into the femoral artery.  The patient was heparinized systemically and ACT verified > 250 seconds.     An 18 Fr Dry-Seal sheath was introduced into the right femoral artery  over an Amplatz superstiff wire. An AL-1 catheter was used to direct a straight-tip exchange length wire across the native aortic valve into the left ventricle. This was exchanged out for a pigtail catheter and position  was confirmed in the LV apex. Simultaneous LV and Ao pressures were recorded.  The pigtail catheter was exchanged for a Safari wire in the LV apex.    BALLOON AORTIC VALVULOPLASTY:    Performed using an 18 mm True balloon while rapid ventricular pacing.   TRANSCATHETER HEART VALVE DEPLOYMENT:    A Medtronic Evolut FX transcatheter heart valve (size 26 mm) was prepared and loaded into the delivery catheter system per manufacturer's guidelines and the proper orientation of the valve is confirmed under fluoroscopy. The delivery system and inline sheath were inserted into the right common femoral artery over the [facility] wire and the inline sheath advanced into the abdominal aorta under fluoroscopic guidance. The delivery catheter was advanced around the aortic arch and the valve was carefully positioned across the aortic valve annulus. An aortic root injection was performed to confirm position and the valve deployed using cusp overlap technique under fluoroscopic guidance. Intermittent pacing was used during valve deployment. The delivery system and guidewire were retracted into the descending aorta and the nosecone re-sheathed. Valve function was assessed using echocardiography. There is felt to be trace paravalvular leak and no central aortic insufficiency. The patient's hemodynamic recovery following valve deployment is good.  PROCEDURE COMPLETION:    The delivery system and in-line sheath were removed and femoral artery closure performed using the Perclose devices.  Protamine was administered once femoral arterial repair was complete. The temporary pacemaker, pigtail catheters and femoral sheaths were removed with manual pressure used venous for hemostasis and a Mynx closure device for contralateral arterial hemostasis.    The patient tolerated the procedure well and is transported to the cath lab recovery area in stable condition. There were no immediate intraoperative complications. All sponge  instrument and needle counts are verified correct at completion of the operation.    No blood products were administered during the operation.   The patient received a total of 40 mL of intravenous contrast during the procedure.     Alleen Borne, MD

## 2023-01-03 NOTE — Progress Notes (Signed)
  Echocardiogram 2D Echocardiogram has been performed.  Delcie Roch 01/03/2023, 2:11 PM

## 2023-01-03 NOTE — Op Note (Signed)
[facility]   MULTIDISCIPLINARY HEART VALVE TEAM   TAVR OPERATIVE NOTE   Date of Procedure:  01/03/2023   Preoperative Diagnosis: Severe Aortic Stenosis   Postoperative Diagnosis: Same   Procedure:   Transcatheter Aortic Valve Replacement - Percutaneous Transfemoral Approach  Medtronic Evolut FX (size 26 mm, serial # [id number])   Co-Surgeons:  Alleen Borne, MD and Tonny Bollman, MD  Anesthesiologist:  Lewie Loron, MD  Echocardiographer:  Guinevere Scarlet, MD  Pre-operative Echo Findings: Severe aortic stenosis Normal left ventricular systolic function  Post-operative Echo Findings: Trace paravalvular leak Normal/unchanged left ventricular systolic function  BRIEF CLINICAL NOTE AND INDICATIONS FOR SURGERY  87 year old woman with severe symptomatic stage D1 aortic stenosis presents today for TAVR.  After review of her case and cardiac anatomy, our multidisciplinary heart team elected to treat her with a self-expanding Medtronic FX valve via transfemoral access.  During the course of the patient's preoperative work up they have been evaluated comprehensively by a multidisciplinary team of specialists coordinated through the Multidisciplinary Heart Valve Clinic in the [facility].  They have been demonstrated to suffer from symptomatic severe aortic stenosis as noted above. The patient has been counseled extensively as to the relative risks and benefits of all options for the treatment of severe aortic stenosis including long term medical therapy, conventional surgery for aortic valve replacement, and transcatheter aortic valve replacement.  The patient has been independently evaluated in formal cardiac surgical consultation by Dr Laneta Simmers, who deemed the patient appropriate for TAVR. Based upon review of all of the patient's preoperative diagnostic tests they are felt to be candidate for transcatheter aortic valve replacement using the  transfemoral approach as an alternative to conventional surgery.    Following the decision to proceed with transcatheter aortic valve replacement, a discussion has been held regarding what types of management strategies would be attempted intraoperatively in the event of life-threatening complications, including whether or not the patient would be considered a candidate for the use of cardiopulmonary bypass and/or conversion to open sternotomy for attempted surgical intervention.  The patient has been advised of a variety of complications that might develop peculiar to this approach including but not limited to risks of death, stroke, paravalvular leak, aortic dissection or other major vascular complications, aortic annulus rupture, device embolization, cardiac rupture or perforation, acute myocardial infarction, arrhythmia, heart block or bradycardia requiring permanent pacemaker placement, congestive heart failure, respiratory failure, renal failure, pneumonia, infection, other late complications related to structural valve deterioration or migration, or other complications that might ultimately cause a temporary or permanent loss of functional independence or other long term morbidity.  The patient provides full informed consent for the procedure as described and all questions were answered preoperatively.  DETAILS OF THE OPERATIVE PROCEDURE  PREPARATION:   The patient is placed in the supine position on the operating table.  Intravenous antibiotics are administered. The patient is monitored closely throughout the procedure under conscious sedation.  Baseline transthoracic echocardiogram is performed. The patient's chest, abdomen, both groins, and both lower extremities are prepared and draped in a sterile manner. A time out procedure is performed.   PERIPHERAL ACCESS:   Using ultrasound guidance, femoral arterial and venous access is obtained with placement of 6 Fr sheaths on the left side.  [place]  images are captured and digitally stored in the patient's record. A pigtail diagnostic catheter was passed through the femoral arterial sheath under fluoroscopic guidance into the aortic root (non-coronary cusp).  A temporary transvenous pacemaker catheter was passed through the femoral venous sheath under fluoroscopic guidance into the right ventricle.  The pacemaker was tested to ensure stable lead placement and pacemaker capture.   TRANSFEMORAL ACCESS:  A micropuncture technique is used to access the right femoral artery under fluoroscopic and ultrasound guidance.  [place] images are captured and digitally stored in the patient's chart. 2 Perclose devices are deployed at 10' and 2' positions to 'PreClose' the femoral artery. An 8 French sheath is placed and then an Amplatz Superstiff wire is advanced through the sheath. This is changed out for an 18 Fr Dry-Seal sheath after progressively dilating over the Superstiff wire.  An AL-1 catheter was used to direct a straight-tip exchange length wire across the native aortic valve into the left ventricle. This was exchanged out for a pigtail catheter and position was confirmed in the LV apex.  Simultaneous LV and Ao pressures were recorded.  The pigtail catheter was exchanged for a Safari wire in the LV apex.    BALLOON AORTIC VALVULOPLASTY:  Performed during rapid ventricular pacing using an 18 mm Bard true balloon  TRANSCATHETER HEART VALVE DEPLOYMENT:  A Medtronic Evolut FX transcatheter heart valve (size 26 mm) was prepared and crimped per manufacturer's guidelines, and the proper orientation of the valve is confirmed on the Medtronic delivery system. The valve was advanced through the introducer sheath and across the aortic arch over the [place] wire until it is positioned at the base of the pigtail catheter in the aortic valve annulus. Using the fine tuning wheel, valve deployment begins until annular contact is made. Controlled ventricular pacing is  performed. Once proper position is confirmed via aortic root angiograms in the cusp overlap and LAO projections, the valve is deployed to 80% where it is fully functional. The patient's hemodynamic recovery following valve deployment is good.  Final position is confirmed and the valve is slowly deployed over 30 seconds until both paddles are released. The delivery catheter and Safari wire are removed and the valve is assessed with echocardiography. Echo demostrated acceptable post-procedural gradients, stable mitral valve function, and trace aortic insufficiency.    PROCEDURE COMPLETION:  The sheath was removed and femoral artery closure is performed using the 2 previously deployed Perclose devices.  Protamine is administered once femoral arterial repair was complete. The site is clear with no evidence of bleeding or hematoma after the sutures are tightened. The temporary pacemaker and pigtail catheters are removed. Mynx closure is used for contralateral femoral arterial hemostasis for the 6 Fr sheath.  The patient tolerated the procedure well and is transported to the recovery area in stable condition. There were no immediate intraoperative complications. All sponge instrument and needle counts are verified correct at completion of the operation.   The patient received a total of 40 mL of intravenous contrast during the procedure.  LVEDP 21 mmHg  Tonny Bollman, MD 01/03/2023 4:40 PM

## 2023-01-03 NOTE — Interval H&P Note (Signed)
History and Physical Interval Note:  01/03/2023 12:18 PM  [name]  has presented today for surgery, with the diagnosis of Severe Aortic Stenosis.  The various methods of treatment have been discussed with the patient and family. After consideration of risks, benefits and other options for treatment, the patient has consented to  Procedure(s): Transcatheter Aortic Valve Replacement, Transfemoral (N/A) INTRAOPERATIVE TRANSTHORACIC ECHOCARDIOGRAM (N/A) as a surgical intervention.  The patient's history has been reviewed, patient examined, no change in status, stable for surgery.  I have reviewed the patient's chart and labs.  Questions were answered to the patient's satisfaction.     Tonny Bollman

## 2023-01-03 NOTE — Interval H&P Note (Signed)
History and Physical Interval Note:  01/03/2023 11:46 AM  [name]  has presented today for surgery, with the diagnosis of Severe Aortic Stenosis.  The various methods of treatment have been discussed with the patient and family. After consideration of risks, benefits and other options for treatment, the patient has consented to  Procedure(s): Transcatheter Aortic Valve Replacement, Transfemoral (N/A) INTRAOPERATIVE TRANSTHORACIC ECHOCARDIOGRAM (N/A) as a surgical intervention.  The patient's history has been reviewed, patient examined, no change in status, stable for surgery.  I have reviewed the patient's chart and labs.  Questions were answered to the patient's satisfaction.     Alleen Borne

## 2023-01-03 NOTE — Transfer of Care (Signed)
Immediate Anesthesia Transfer of Care Note  Patient: [name]  Procedure(s) Performed: Transcatheter Aortic Valve Replacement, Transfemoral INTRAOPERATIVE TRANSTHORACIC ECHOCARDIOGRAM  Patient Location: Cath Lab  Anesthesia Type:General  Level of Consciousness: awake, alert , and oriented  Airway & Oxygen Therapy: Patient Spontanous Breathing  Post-op Assessment: Report given to RN and Post -op Vital signs reviewed and stable  Post vital signs: Reviewed and stable  Last Vitals:  Vitals Value Taken Time  BP 90/43 01/03/23 1435  Temp 98   Pulse 52 01/03/23 1438  Resp 13 01/03/23 1438  SpO2 91 % 01/03/23 1438  Vitals shown include unfiled device data.  Last Pain:  Vitals:   01/03/23 0926  TempSrc:   PainSc: 0-No pain         Complications: There were no known notable events for this encounter.

## 2023-01-03 NOTE — Progress Notes (Signed)
  [facility]ASCULAR [facility]ENTER   MULTIDISCIPLINARY HEART VALVE TEAM  Patient doing well s/p TAVR. She is hemodynamically stable, but BP is soft requiring trendelenburg and IV fluids. Groin sites stable. ECG with HR in 50s with junctional rhythm. QRS is narrow. Tele shows intermittent junctional and sinus brady with old 1st degree AV block.  Plan to transfer to from cath lab holding to 4E when BP stabilizes and bed available. Early ambulation after bedrest completed and hopeful discharge over the next 24-48 hours.   Cline Crock PA-C  [facility]  Pager [phone]

## 2023-01-04 ENCOUNTER — Encounter (HOSPITAL_COMMUNITY): Payer: Self-pay | Admitting: Cardiovascular Disease

## 2023-01-04 ENCOUNTER — Inpatient Hospital Stay (HOSPITAL_COMMUNITY): Payer: Medicare Other

## 2023-01-04 ENCOUNTER — Inpatient Hospital Stay (HOSPITAL_COMMUNITY)
Admit: 2023-01-04 | Discharge: 2023-01-04 | Disposition: A | Discharge status: Home / Self Care | Payer: Medicare Other | Attending: Physician Assistant | Admitting: Physician Assistant

## 2023-01-04 DIAGNOSIS — Z952 Presence of prosthetic heart valve: Secondary | ICD-10-CM

## 2023-01-04 DIAGNOSIS — I440 Atrioventricular block, first degree: Secondary | ICD-10-CM | POA: Diagnosis not present

## 2023-01-04 LAB — CBC
HCT: 38.1 % (ref 36.0–46.0)
Hemoglobin: 12.3 g/dL (ref 12.0–15.0)
MCH: 34.8 pg — ABNORMAL HIGH (ref 26.0–34.0)
MCHC: 32.3 g/dL (ref 30.0–36.0)
MCV: 107.9 fL — ABNORMAL HIGH (ref 80.0–100.0)
Platelets: 68 10*3/uL — ABNORMAL LOW (ref 150–400)
RBC: 3.53 MIL/uL — ABNORMAL LOW (ref 3.87–5.11)
RDW: 13 % (ref 11.5–15.5)
WBC: 4.5 10*3/uL (ref 4.0–10.5)
nRBC: 0 % (ref 0.0–0.2)

## 2023-01-04 LAB — MAGNESIUM: Magnesium: 2.1 mg/dL (ref 1.7–2.4)

## 2023-01-04 LAB — BASIC METABOLIC PANEL
Anion gap: 4 — ABNORMAL LOW (ref 5–15)
BUN: 9 mg/dL (ref 8–23)
CO2: 27 mmol/L (ref 22–32)
Calcium: 8 mg/dL — ABNORMAL LOW (ref 8.9–10.3)
Chloride: 108 mmol/L (ref 98–111)
Creatinine, Ser: 0.63 mg/dL (ref 0.44–1.00)
GFR, Estimated: >60 mL/min (ref >60)
Glucose, Bld: 111 mg/dL — ABNORMAL HIGH (ref 70–99)
Potassium: 4.3 mmol/L (ref 3.5–5.1)
Sodium: 139 mmol/L (ref 135–145)

## 2023-01-04 LAB — GLUCOSE, CAPILLARY: Glucose-Capillary: 101 mg/dL — ABNORMAL HIGH (ref 70–99)

## 2023-01-04 MED ORDER — ASPIRIN 81 MG PO TBEC: 81.0000 mg | DELAYED_RELEASE_TABLET | Freq: Every day | ORAL | Status: AC

## 2023-01-04 MED ORDER — LIDOCAINE-EPINEPHRINE 1 %-1:100000 IJ SOLN
INTRAMUSCULAR | Status: AC
  Filled 2023-01-04: qty 1

## 2023-01-04 NOTE — Progress Notes (Signed)
S/p TAVR, stable for transition home today, no needs noted.     01/04/23 1440  TOC Brief Assessment  Insurance and Status Reviewed  Patient has primary care physician Yes  Home environment has been reviewed home  Prior level of function: self  Prior/Current Home Services No current home services  Social Determinants of Health Reivew SDOH reviewed no interventions necessary  Readmission risk has been reviewed Yes  Transition of care needs no transition of care needs at this time

## 2023-01-04 NOTE — Progress Notes (Signed)
  Echocardiogram 2D Echocardiogram has been performed.  Milda Smart 01/04/2023, 12:12 PM

## 2023-01-04 NOTE — Progress Notes (Signed)
Mobility Specialist Progress Note:   01/04/23 1217  Mobility  Activity Ambulated with assistance to bathroom  Level of Assistance Minimal assist, patient does 75% or more  Assistive Device Front wheel walker  Distance Ambulated (ft) 30 ft  Activity Response Tolerated well  Mobility Referral Yes  $Mobility charge 1 Mobility  Mobility Specialist Start Time (ACUTE ONLY) 1205  Mobility Specialist Stop Time (ACUTE ONLY) 1215  Mobility Specialist Time Calculation (min) (ACUTE ONLY) 10 min   Pt received in bed, requesting assistance to BR. Groin incision sites stable. CG to stand and ambulate to BR. Void successful. Asx throughout. Pt returned to bed with call bell in reach and all needs met. Family present.  Leory Plowman  Mobility Specialist Please contact via [employer] office at [phone]

## 2023-01-04 NOTE — Discharge Instructions (Signed)

## 2023-01-04 NOTE — Progress Notes (Signed)
PT Cancellation Note  Patient Details Name: [name] MRN: [id number] DOB: [birth year]   Cancelled Treatment:    Reason Eval/Treat Not Completed: PT screened, no needs identified, will sign off. Pt has ambulated  several times today and pt and nursing both report no difficulties.    Angelina Ok [facility] 01/04/2023, 2:12 PM Skip Mayer PT Acute [employer] [phone]

## 2023-01-04 NOTE — Progress Notes (Signed)
CARDIAC REHAB PHASE I   PRE:  Rate/Rhythm: 74 SR   BP:  Sitting: 120/55      SaO2: 98 RA   MODE:  Ambulation: 150 ft   POST:  Rate/Rhythm: 87 SR   BP:  Sitting: 152/62      SaO2: 97 RA    Pt ambulated in hallway, moving at a slow steady pace. Returned to bed with call bell and bedside table in reach. Post TAVR education including site care, restrictions, exercise guidelines, heart healthy diet and CRP2 reviewed. All questions and concerns addressed. Will refer to [facility] for CRP2. Plan for home later today.   [phone]  Woodroe Chen, RN BSN 01/04/2023 9:19 AM

## 2023-01-04 NOTE — Progress Notes (Signed)
Discharge:  Discharge summary reviewed with patient.  IV access discontinued,  CCMD notified. Assisted by staff to private vehicle

## 2023-01-04 NOTE — Plan of Care (Signed)
  Problem: Clinical Measurements: Goal: Will remain free from infection Outcome: Progressing   Problem: Coping: Goal: Level of anxiety will decrease Outcome: Not Progressing   Problem: Pain Management: Goal: General experience of comfort will improve Outcome: Progressing

## 2023-01-05 DIAGNOSIS — Z952 Presence of prosthetic heart valve: Secondary | ICD-10-CM | POA: Diagnosis not present

## 2023-01-05 DIAGNOSIS — I440 Atrioventricular block, first degree: Secondary | ICD-10-CM | POA: Diagnosis not present

## 2023-01-06 ENCOUNTER — Telehealth: Payer: Self-pay | Admitting: Physician Assistant

## 2023-01-06 NOTE — Telephone Encounter (Signed)
  HEART AND VASCULAR CENTER   MULTIDISCIPLINARY HEART VALVE TEAM   Patient contacted regarding discharge from [facility] on 01/04/23  Patient understands to follow up with a structural heart APP on 01/13/23 at [address] [facility].  Patient understands discharge instructions? yes Patient understands medications and regimen? yes Patient understands to bring all medications to this visit? yes  Cline Crock PA-C  [facility]   ,

## 2023-01-09 LAB — ECHOCARDIOGRAM COMPLETE
AR max vel: 2.69 cm2
AV Area VTI: 2.81 cm2
AV Area mean vel: 2.76 cm2
AV Mean grad: 9 mm[Hg]
AV Peak grad: 14.9 mm[Hg]
Ao pk vel: 1.93 m/s
Calc EF: 66.9 %
Height: 62 in
MV M vel: 2.68 m/s
MV Peak grad: 28.6 mm[Hg]
S' Lateral: 2.8 cm
Single Plane A2C EF: 66.4 %
Single Plane A4C EF: 64.2 %
Weight: 3580.27 [oz_av]

## 2023-01-10 ENCOUNTER — Telehealth (HOSPITAL_COMMUNITY): Payer: Self-pay

## 2023-01-10 NOTE — Telephone Encounter (Signed)
Called patient to see if she is interested in the Cardiac Rehab Program. Patient expressed interest. Explained scheduling process and went over insurance, patient verbalized understanding. Will contact patient for scheduling once f/u has been completed. 

## 2023-01-10 NOTE — Telephone Encounter (Signed)
Pt insurance is active and benefits verified through Medicare A/B. Co-pay $0.00, DED $240.00/$240.00 met, out of pocket $0.00/$0.00 met, co-insurance 20%. No pre-authorization required. Passport, 01/10/23 @ 10:39AM, REF#[id number]   How many CR sessions are covered? (36 visits for TCR, 72 visits for ICR)72 Is this a lifetime maximum or an annual maximum? Lifetime Has the member used any of these services to date? No Is there a time limit (weeks/months) on start of program and/or program completion? No   2ndary insurance is active and benefits verified through [employer]. Co-pay $0.00, DED $0.00/$0.00 met, out of pocket $0.00/$0.00 met, co-insurance 0%. No pre-authorization required. Passport, 01/10/23 @ 10:41AM, REF#[id number]    Will contact patient to see if she is interested in the Cardiac Rehab Program. If interested, patient will need to complete follow up appt. Once completed, patient will be contacted for scheduling upon review by the RN Navigator.

## 2023-01-12 ENCOUNTER — Ambulatory Visit: Payer: Medicare Other | Admitting: Cardiovascular Disease

## 2023-01-13 ENCOUNTER — Ambulatory Visit: Payer: Medicare Other | Attending: Physician Assistant | Admitting: Physician Assistant

## 2023-01-13 VITALS — BP 136/72 | HR 66 | Ht 63.0 in | Wt 211.0 lb

## 2023-01-13 DIAGNOSIS — E785 Hyperlipidemia, unspecified: Secondary | ICD-10-CM | POA: Diagnosis not present

## 2023-01-13 DIAGNOSIS — I440 Atrioventricular block, first degree: Secondary | ICD-10-CM | POA: Diagnosis not present

## 2023-01-13 DIAGNOSIS — I10 Essential (primary) hypertension: Secondary | ICD-10-CM | POA: Insufficient documentation

## 2023-01-13 DIAGNOSIS — Z952 Presence of prosthetic heart valve: Secondary | ICD-10-CM | POA: Diagnosis not present

## 2023-01-13 DIAGNOSIS — R1900 Intra-abdominal and pelvic swelling, mass and lump, unspecified site: Secondary | ICD-10-CM | POA: Insufficient documentation

## 2023-01-13 DIAGNOSIS — N289 Disorder of kidney and ureter, unspecified: Secondary | ICD-10-CM | POA: Diagnosis not present

## 2023-01-13 DIAGNOSIS — M069 Rheumatoid arthritis, unspecified: Secondary | ICD-10-CM | POA: Insufficient documentation

## 2023-01-13 DIAGNOSIS — N2889 Other specified disorders of kidney and ureter: Secondary | ICD-10-CM | POA: Insufficient documentation

## 2023-01-13 DIAGNOSIS — D696 Thrombocytopenia, unspecified: Secondary | ICD-10-CM | POA: Insufficient documentation

## 2023-01-13 NOTE — Patient Instructions (Addendum)
Medication Instructions:  Your physician recommends that you continue on your current medications as directed. Please refer to the Current Medication list given to you today.  *If you need a refill on your cardiac medications before your next appointment, please call your pharmacy*   Lab Work: CBC- Today   If you have labs (blood work) drawn today and your tests are completely normal, you will receive your results only by: MyChart Message (if you have MyChart) OR A paper copy in the mail If you have any lab test that is abnormal or we need to change your treatment, we will call you to review the results.   Testing/Procedures: Your physician has ordered for you to have a MRI of your abdomen    Follow-Up: Follow up as scheduled   Other Instructions

## 2023-01-13 NOTE — Progress Notes (Addendum)
[facility]ENTER   MULTIDISCIPLINARY HEART VALVE CLINIC                                     Cardiology Office Note:    Date:  01/13/2023   ID:  [name], DOB 03/19/1935, MRN [id number]  PCP:  Katherine Ran, MD  [facility] Cardiologist:  Katherine Bollman, MD  [facility] Electrophysiologist:  None   Referring MD: Katherine Ran, MD   [facility] s/p TAVR  History of Present Illness:    [name] is a 87 y.o. female with a hx of HLD, morbid obesity (BMI 36), 1st degree AV block, rheumatoid arthritis and severe aortic stenosis s/p TAVR (01/03/23) who presents to clinic for follow up.  She has been followed over time for aortic stenosis. She recently developed fatigue and dizziness. Echo 09/20/22 showed EF 60% and severe AS with a mean grad 42 mmHg, AVA 0.98 cm2. [facility] 12/08/22 showed chronic total occlusion of the RCA, collateralized by the left coronary artery as well a patent left main, LAD, and left circumflex with mild nonobstructive plaquing. Medical therapy recommended given lack of exertional angina. S/p TAVR with a 26 Medtronic FX THV via the TF approach on 01/03/23. Post operative echo showed EF 65%, normally functioning TAVR with a mean gradient of 9 mmHg and no PVL as well as mild MR. Started on a baby Asprin 81mg  daily (computer flagged a potential interaction with methotrexate but discussed with pharmacy and it should be okay). Given progressive first deg AV block and intermittent junctional rhythm, a Zio AT was placed at discharge.  Today the patient presents to clinic for follow up. Here with her son Katherine Needle. No CP or SOB. No LE edema, orthopnea or PND. No dizziness or syncope. No blood in stool or urine. No palpitations. Can't believe how much better she is feeling.    Past Medical History:  Diagnosis Date   GERD (gastroesophageal reflux disease)    Hyperlipidemia    Hyperthyroidism    Hypothyroidism    Macular degeneration    Morbid obesity (HCC)     Osteoporosis    PONV (postoperative nausea and vomiting)    none recently   S/P TAVR (transcatheter aortic valve replacement) 01/03/2023   s/p TAVR with a 26 mm Medtronic FX via the TF approach by Dr. Excell Ellis & [name]   Severe aortic stenosis    Shingles 04/21/2014   seen by Dr Waynard Edwards and treated with Valtrex     Current Medications: Current Meds  Medication Sig   acetaminophen (TYLENOL) 500 MG tablet Take 500 mg by mouth every 6 (six) hours as needed for headache.   aspirin EC 81 MG tablet Take 1 tablet (81 mg total) by mouth daily. Swallow whole.   Cholecalciferol (VITAMIN D3) 250 MCG (10000 UT) capsule Take 10,000 Units by mouth every Monday, Wednesday, and Friday.   diphenhydrAMINE (BENADRYL ALLERGY) 25 MG tablet Take 12.5 mg by mouth 2 (two) times daily.   diphenhydrAMINE (BENADRYL) 50 MG/ML injection 50 mg Injection with infusions   estrogens, conjugated, (PREMARIN) 0.3 MG tablet Take 0.3 mg by mouth daily with breakfast.    folic acid (FOLVITE) 1 MG tablet Take 1 mg by mouth daily with breakfast.   methotrexate (RHEUMATREX) 2.5 MG tablet Take 7.5 mg by mouth 2 (two) times a week. Caution:Chemotherapy. Protect from light. On Saturdays and Sundays   Multiple Vitamins-Minerals (  PRESERVISION AREDS 2 PO) Take 1 capsule by mouth 2 (two) times daily.   OVER THE COUNTER MEDICATION Take 1 tablet by mouth daily. Beyond Osteo supplement   Propylene Glycol (SYSTANE BALANCE OP) Place 1 drop into both eyes 2 (two) times daily as needed (dry eyes).   Psyllium (METAMUCIL 3 IN 1 DAILY FIBER PO) Take 1-2 capsules by mouth daily.   RESTASIS 0.05 % ophthalmic emulsion Place 1 drop into both eyes 2 (two) times daily.   rosuvastatin (CRESTOR) 20 MG tablet Take 20 mg by mouth daily.   saccharomyces boulardii (FLORASTOR) 250 MG capsule Take 250 mg by mouth every other day.   tocilizumab (ACTEMRA) 400 MG/20ML SOLN injection Inject into the vein every 30 (thirty) days.   traMADol (ULTRAM) 50 MG tablet Take  1-2 tablets (50-100 mg total) by mouth every 6 (six) hours as needed. (Patient taking differently: Take 25 mg by mouth daily.)   vitamin C (ASCORBIC ACID) 500 MG tablet Take 500 mg by mouth every other day.      ROS:   Please see the history of present illness.    All other systems reviewed and are negative.  EKGs   EKG Interpretation Date/Time:  Friday January 13 2023 09:29:17 EST Ventricular Rate:  66 PR Interval:  328 QRS Duration:  112 QT Interval:  402 QTC Calculation: 421 R Axis:   4  Text Interpretation: Sinus rhythm with 1st degree A-V block Low voltage QRS ST & T wave abnormality, consider lateral ischemia Confirmed by Cline Crock [phone]) on 01/13/2023 9:34:33 AM   Risk Assessment/Calculations:           Physical Exam:    VS:  BP 136/72   Pulse 66   Ht 5\' 3"  (1.6 m)   Wt 211 lb (95.7 kg)   SpO2 93%   BMI 37.38 kg/m     Wt Readings from Last 3 Encounters:  01/13/23 211 lb (95.7 kg)  01/04/23 223 lb 12.3 oz (101.5 kg)  12/21/22 208 lb (94.3 kg)     GEN: Well nourished, well developed in no acute distress NECK: No JVD CARDIAC: RRR, no murmurs, rubs, gallops RESPIRATORY:  Clear to auscultation without rales, wheezing or rhonchi  ABDOMEN: Soft, non-tender, non-distended EXTREMITIES:  No edema; No deformity.  Groin sites clear without hematoma or ecchymosis. Right grion still open with a little serosanguinous drainage  ASSESSMENT:    1. S/P TAVR (transcatheter aortic valve replacement)   2. 1st degree AV block   3. Essential hypertension   4. Hyperlipidemia, unspecified hyperlipidemia type   5. Rheumatoid arthritis, involving unspecified site, unspecified whether rheumatoid factor present (HCC)   6. Thrombocytopenia (HCC)   7. Retroperitoneal mass   8. Renal lesion   9. Other specified disorders of kidney and ureter     PLAN:    In order of problems listed above:  Severe AS s/p TAVR: pt doing great s/p TAVR. ECG with no HAVB. Groin sites  healing well. SBE prophylaxis discussed. She has amoxicillin and takes for her knee replacements. Continue aspirin 81mg  daily alone. I will see back for 1 month echo and OV.   1st deg AV block: wearing Zio AT with no HAVB   HTN: BP well controlled. Not on any anti-hypertensives.    HLD: resume home Crestor 20mg  daily   RA: resumed home methotrexate and Actemra   Thrombocytopenia: plt down to 68 after TAVR. Check CBC today.    Retroperitoneal mass: pre TAVR CT showed an "indeterminate  high left retroperitoneal 2.2 x 1.9 cm solid mass abutting the left diaphragmatic crus and medial margin of the left adrenal gland, not previously imaged. Malignancy not excluded." Follow up PET-CT 11/19 did not show any malignancy.    Renal lesion: pre TAVR CT showed a "hypodense exophytic 1.8 cm posterior lower left renal cortical lesion, indeterminate, cannot exclude a solid renal neoplasm. MRI (preferred) or CT abdomen without and with IV contrast is indicated for further evaluation." Initially we got a MRI set up but then I saw that PET scan 11/19 showed a cyst on kidney and hyperdense/hemorrhagic lesion along the left diaphragmatic crus, favoring postsurgical change versus a small hematoma. No further work up needed. Pt made aware of change in plan.      Cardiac Rehabilitation Eligibility Assessment  The patient is ready to start cardiac rehabilitation from a cardiac standpoint.        Medication Adjustments/Labs and Tests Ordered: Current medicines are reviewed at length with the patient today.  Concerns regarding medicines are outlined above.  Orders Placed This Encounter  Procedures   CBC   EKG 12-Lead   No orders of the defined types were placed in this encounter.   Patient Instructions  Medication Instructions:  Your physician recommends that you continue on your current medications as directed. Please refer to the Current Medication list given to you today.  *If you need a refill on  your cardiac medications before your next appointment, please call your pharmacy*   Lab Work: CBC- Today   If you have labs (blood work) drawn today and your tests are completely normal, you will receive your results only by: MyChart Message (if you have MyChart) OR A paper copy in the mail If you have any lab test that is abnormal or we need to change your treatment, we will call you to review the results.   Testing/Procedures: Your physician has ordered for you to have a MRI of your abdomen    Follow-Up: Follow up as scheduled   Other Instructions     Signed, Cline Crock, PA-C  01/13/2023 10:46 AM    [facility]

## 2023-01-13 NOTE — Addendum Note (Signed)
Addended by: Janetta Hora on: 01/13/2023 10:47 AM   Modules accepted: Orders

## 2023-01-14 LAB — CBC
Hematocrit: 44.4 % (ref 34.0–46.6)
Hemoglobin: 14.2 g/dL (ref 11.1–15.9)
MCH: 34.3 pg — ABNORMAL HIGH (ref 26.6–33.0)
MCHC: 32 g/dL (ref 31.5–35.7)
MCV: 107 fL — ABNORMAL HIGH (ref 79–97)
Platelets: 144 10*3/uL — ABNORMAL LOW (ref 150–450)
RBC: 4.14 x10E6/uL (ref 3.77–5.28)
RDW: 12.2 % (ref 11.7–15.4)
WBC: 6.6 10*3/uL (ref 3.4–10.8)

## 2023-01-17 DIAGNOSIS — M0579 Rheumatoid arthritis with rheumatoid factor of multiple sites without organ or systems involvement: Secondary | ICD-10-CM | POA: Diagnosis not present

## 2023-01-20 ENCOUNTER — Other Ambulatory Visit (HOSPITAL_COMMUNITY): Payer: Medicare Other

## 2023-01-25 DIAGNOSIS — L821 Other seborrheic keratosis: Secondary | ICD-10-CM | POA: Diagnosis not present

## 2023-01-25 DIAGNOSIS — Z85828 Personal history of other malignant neoplasm of skin: Secondary | ICD-10-CM | POA: Diagnosis not present

## 2023-01-25 DIAGNOSIS — L578 Other skin changes due to chronic exposure to nonionizing radiation: Secondary | ICD-10-CM | POA: Diagnosis not present

## 2023-01-30 ENCOUNTER — Telehealth (HOSPITAL_COMMUNITY): Payer: Self-pay

## 2023-01-30 NOTE — Telephone Encounter (Signed)
Pt returned CR phone call and stated she is interested. Patient will come in for orientation on 02/21/23 @ 1:15PM and will attend the 10:15AM exercise class.   Pensions consultant.

## 2023-01-30 NOTE — Telephone Encounter (Signed)
Attempted to call patient in regards to Cardiac Rehab - LM on VM Mailed letter 

## 2023-02-07 NOTE — Progress Notes (Addendum)
 [facility] VASCULAR [facility]ENTER   MULTIDISCIPLINARY HEART VALVE CLINIC                                     Cardiology Office Note:    Date:  02/10/2023   ID:  [name], DOB 1935/05/28, MRN [id number]  PCP:  Katherine Anes, MD  [facility] Cardiologist:  Katherine Fell, MD  [facility] Electrophysiologist:  None   Referring MD: Katherine Anes, MD   1 month s/p TAVR  History of Present Illness:    [name] is a 87 y.o. female with a hx of HLD, morbid obesity (BMI 36), 1st degree AV block, rheumatoid arthritis and severe aortic stenosis s/p TAVR (01/03/23) who presents to clinic for follow up.  She has been followed over time for aortic stenosis. She recently developed fatigue and dizziness. Echo 09/20/22 showed EF 60% and severe AS with a mean grad 42 mmHg, AVA 0.98 cm2. [name] 12/08/22 showed chronic total occlusion of the RCA, collateralized by the left coronary artery as well a patent left main, LAD, and left circumflex with mild nonobstructive plaquing. Medical therapy recommended given lack of exertional angina. S/p TAVR with a 26 Medtronic FX THV via the TF approach on 01/03/23. Post operative echo showed EF 65%, normally functioning TAVR with a mean gradient of 9 mmHg and no PVL as well as mild MR. Started on a baby Asprin 81mg  daily (computer flagged a potential interaction with methotrexate but discussed with pharmacy and it should be okay). Given progressive first deg AV block and intermittent junctional rhythm, a Zio AT was placed at discharge. This did not show any significant arrhythmias. She has had a significant improvement since TAVR.    Today the patient presents to clinic for follow up. Here with her Ellis Katherine. No CP or SOB. She does have some new LE edema that is not bothersome but no orthopnea or PND. She feels just wonderful. No dizziness or syncope. No blood in stool or urine. No palpitations. Had a big dinner at [name] to celebrate her 87th birthday.     Past Medical History:  Diagnosis Date   GERD (gastroesophageal reflux disease)    Hyperlipidemia    Hyperthyroidism    Hypothyroidism    Macular degeneration    Morbid obesity (HCC)    Osteoporosis    PONV (postoperative nausea and vomiting)    none recently   S/P TAVR (transcatheter aortic valve replacement) 01/03/2023   s/p TAVR with a 26 mm Medtronic FX via the TF approach by Dr. Fell & Barle   Severe aortic stenosis    Shingles 04/21/2014   seen by Dr Katherine and treated with Valtrex     Current Medications: Current Meds  Medication Sig   acetaminophen  (TYLENOL ) 500 MG tablet Take 500 mg by mouth every 6 (six) hours as needed for headache.   aspirin  EC 81 MG tablet Take 1 tablet (81 mg total) by mouth daily. Swallow whole.   Cholecalciferol (VITAMIN D3) 250 MCG (10000 UT) capsule Take 10,000 Units by mouth every Monday, Wednesday, and Friday.   diphenhydrAMINE  (BENADRYL  ALLERGY) 25 MG tablet Take 12.5 mg by mouth 2 (two) times daily.   diphenhydrAMINE  (BENADRYL ) 50 MG/ML injection 50 mg Injection with infusions   estrogens, conjugated, (PREMARIN) 0.3 MG tablet Take 0.3 mg by mouth daily with breakfast.    folic acid  (FOLVITE ) 1 MG tablet Take 1  mg by mouth daily with breakfast.   methotrexate (RHEUMATREX) 2.5 MG tablet Take 7.5 mg by mouth 2 (two) times a week. Caution:Chemotherapy. Protect from light. On Saturdays and Sundays   Multiple Vitamins-Minerals (PRESERVISION AREDS 2 PO) Take 1 capsule by mouth 2 (two) times daily.   OVER THE COUNTER MEDICATION Take 1 tablet by mouth daily. Beyond Osteo supplement   Propylene Glycol (SYSTANE BALANCE OP) Place 1 drop into both eyes 2 (two) times daily as needed (dry eyes).   Psyllium (METAMUCIL 3 IN 1 DAILY FIBER PO) Take 1-2 capsules by mouth daily.   RESTASIS 0.05 % ophthalmic emulsion Place 1 drop into both eyes 2 (two) times daily.   rosuvastatin  (CRESTOR ) 20 MG tablet Take 20 mg by mouth daily.   saccharomyces boulardii  (FLORASTOR) 250 MG capsule Take 250 mg by mouth every other day.   tocilizumab  (ACTEMRA ) 400 MG/20ML SOLN injection Inject into the vein every 30 (thirty) days.   traMADol  (ULTRAM ) 50 MG tablet Take 1-2 tablets (50-100 mg total) by mouth every 6 (six) hours as needed. (Patient taking differently: Take 25 mg by mouth daily.)   vitamin C (ASCORBIC ACID) 500 MG tablet Take 500 mg by mouth every other day.   [DISCONTINUED] furosemide  (LASIX ) 20 MG tablet Take 1 tablet (20 mg total) by mouth daily as needed (swelling).      ROS:   Please see the history of present illness.    All other systems reviewed and are negative.  EKGs       Risk Assessment/Calculations:           Physical Exam:    VS:  BP 138/80   Pulse 71   Ht 5' 4 (1.626 m)   Wt 210 lb 6.4 oz (95.4 kg)   SpO2 93%   BMI 36.12 kg/m     Wt Readings from Last 3 Encounters:  02/10/23 210 lb 6.4 oz (95.4 kg)  01/13/23 211 lb (95.7 kg)  01/04/23 223 lb 12.3 oz (101.5 kg)     GEN: Well nourished, well developed in no acute distress NECK: No JVD CARDIAC: RRR, no murmurs, rubs, gallops RESPIRATORY:  Clear to auscultation without rales, wheezing or rhonchi  ABDOMEN: Soft, non-tender, non-distended EXTREMITIES: 2+ bilateral LE edema.   ASSESSMENT:    1. S/P TAVR (transcatheter aortic valve replacement)   2. 1st degree AV block   3. Essential hypertension   4. Hyperlipidemia, unspecified hyperlipidemia type   5. Rheumatoid arthritis, involving unspecified site, unspecified whether rheumatoid factor present (HCC)   6. Thrombocytopenia (HCC)   7. Retroperitoneal mass   8. Renal lesion    PLAN:    In order of problems listed above:  Severe AS s/p TAVR: echo today shows EF 55%, mod LVH, normally functioning TAVR with a mean gradient of 10 mm hg and no PVL. She has NYHA class I symptoms with a marked improvement since TAVR. SBE prophylaxis discussed; she has amoxicillin. Continue aspirin  81mg  daily alone. I will see  back for 1 year echo and OV.   1st deg AV block: Zio did not show any HAVB.    HTN: BP mildly elevated today ~138/80. She does have some mild new LE edema. Echo showed moderate LVH and elevated filling pressures. Will start Lasix  20mg  daily. She will need a BMET in 2 weeks.    HLD: continue Crestor  20mg  daily   RA: continue methotrexate and Actemra    Thrombocytopenia: plt down to 68 after TAVR. Follow up labs showed PLT  144   Retroperitoneal mass: pre TAVR CT showed an indeterminate high left retroperitoneal 2.2 x 1.9 cm solid mass abutting the left diaphragmatic crus and medial margin of the left adrenal gland, not previously imaged. Malignancy not excluded. Follow up PET-CT 12/27/22 did not show any malignancy.    Renal lesion: pre TAVR CT showed a hypodense exophytic 1.8 cm posterior lower left renal cortical lesion, indeterminate, cannot exclude a solid renal neoplasm. PET CT 12/27/22 showed it was a cyst with no further work up needed.   Medication Adjustments/Labs and Tests Ordered: Current medicines are reviewed at length with the patient today.  Concerns regarding medicines are outlined above.  Orders Placed This Encounter  Procedures   Basic metabolic panel   ECHOCARDIOGRAM COMPLETE   Meds ordered this encounter  Medications   DISCONTD: furosemide  (LASIX ) 20 MG tablet    Sig: Take 1 tablet (20 mg total) by mouth daily as needed (swelling).    Dispense:  30 tablet    Refill:  11   furosemide  (LASIX ) 20 MG tablet    Sig: Take 1 tablet (20 mg total) by mouth daily.    Supervising Provider:   WONDA SHARPER [[id number]]    Patient Instructions  Medication Instructions:  Your physician has recommended you make the following change in your medication:  START: furosemide  (Lasix ) 20 mg by mouth once daily   *If you need a refill on your cardiac medications before your next appointment, please call your pharmacy*   Lab Work: NONE  If you have labs (blood work) drawn today  and your tests are completely normal, you will receive your results only by: MyChart Message (if you have MyChart) OR A paper copy in the mail If you have any lab test that is abnormal or we need to change your treatment, we will call you to review the results.   Testing/Procedures: Your physician has requested that you have an echocardiogram. Echocardiography is a painless test that uses sound waves to create images of your heart. It provides your doctor with information about the size and shape of your heart and how well your heart's chambers and valves are working. This procedure takes approximately one hour. There are no restrictions for this procedure. Please do NOT wear cologne, perfume, aftershave, or lotions (deodorant is allowed). Please arrive 15 minutes prior to your appointment time.  Please note: We ask at that you not bring children with you during ultrasound (echo/ vascular) testing. Due to room size and safety concerns, children are not allowed in the ultrasound rooms during exams. Our front office staff cannot provide observation of children in our lobby area while testing is being conducted. An adult accompanying a patient to their appointment will only be allowed in the ultrasound room at the discretion of the ultrasound technician under special circumstances. We apologize for any inconvenience.     Follow-Up:As scheduled At [facility], you and your health needs are our priority.  As part of our continuing mission to provide you with exceptional heart care, we have created designated Provider Care Teams.  These Care Teams include your primary Cardiologist (physician) and Advanced Practice Providers (APPs -  Physician Assistants and Nurse Practitioners) who all work together to provide you with the care you need, when you need it.           Signed, Lamarr Hummer, PA-C  02/10/2023 2:22 PM    [facility]

## 2023-02-10 ENCOUNTER — Ambulatory Visit: Payer: Medicare Other | Attending: Internal Medicine

## 2023-02-10 ENCOUNTER — Other Ambulatory Visit: Payer: Self-pay | Admitting: Physician Assistant

## 2023-02-10 ENCOUNTER — Ambulatory Visit (INDEPENDENT_AMBULATORY_CARE_PROVIDER_SITE_OTHER): Payer: Medicare Other | Admitting: Physician Assistant

## 2023-02-10 VITALS — BP 138/80 | HR 71 | Ht 64.0 in | Wt 210.4 lb

## 2023-02-10 DIAGNOSIS — Z952 Presence of prosthetic heart valve: Secondary | ICD-10-CM | POA: Insufficient documentation

## 2023-02-10 DIAGNOSIS — M069 Rheumatoid arthritis, unspecified: Secondary | ICD-10-CM | POA: Insufficient documentation

## 2023-02-10 DIAGNOSIS — R1900 Intra-abdominal and pelvic swelling, mass and lump, unspecified site: Secondary | ICD-10-CM | POA: Diagnosis not present

## 2023-02-10 DIAGNOSIS — I10 Essential (primary) hypertension: Secondary | ICD-10-CM | POA: Diagnosis not present

## 2023-02-10 DIAGNOSIS — N289 Disorder of kidney and ureter, unspecified: Secondary | ICD-10-CM | POA: Insufficient documentation

## 2023-02-10 DIAGNOSIS — E785 Hyperlipidemia, unspecified: Secondary | ICD-10-CM | POA: Diagnosis not present

## 2023-02-10 DIAGNOSIS — D696 Thrombocytopenia, unspecified: Secondary | ICD-10-CM | POA: Diagnosis not present

## 2023-02-10 DIAGNOSIS — I440 Atrioventricular block, first degree: Secondary | ICD-10-CM

## 2023-02-10 LAB — ECHOCARDIOGRAM COMPLETE
AR max vel: 1.26 cm2
AV Area VTI: 1.3 cm2
AV Area mean vel: 1.34 cm2
AV Mean grad: 10 mm[Hg]
AV Peak grad: 22.4 mm[Hg]
Ao pk vel: 2.37 m/s
Area-P 1/2: 2.75 cm2
P 1/2 time: 278 ms
S' Lateral: 3 cm

## 2023-02-10 MED ORDER — FUROSEMIDE 20 MG PO TABS: 20.0000 mg | ORAL_TABLET | Freq: Every day | ORAL | Status: DC

## 2023-02-10 MED ORDER — FUROSEMIDE 20 MG PO TABS: 20.0000 mg | ORAL_TABLET | Freq: Every day | ORAL | 11 refills | Status: DC | PRN

## 2023-02-10 NOTE — Addendum Note (Signed)
 Addended by: Janetta Hora on: 02/10/2023 02:22 PM   Modules accepted: Orders

## 2023-02-10 NOTE — Patient Instructions (Addendum)
 Medication Instructions:  Your physician has recommended you make the following change in your medication:  START: furosemide  (Lasix ) 20 mg by mouth once daily   *If you need a refill on your cardiac medications before your next appointment, please call your pharmacy*   Lab Work: NONE  If you have labs (blood work) drawn today and your tests are completely normal, you will receive your results only by: MyChart Message (if you have MyChart) OR A paper copy in the mail If you have any lab test that is abnormal or we need to change your treatment, we will call you to review the results.   Testing/Procedures: Your physician has requested that you have an echocardiogram. Echocardiography is a painless test that uses sound waves to create images of your heart. It provides your doctor with information about the size and shape of your heart and how well your heart's chambers and valves are working. This procedure takes approximately one hour. There are no restrictions for this procedure. Please do NOT wear cologne, perfume, aftershave, or lotions (deodorant is allowed). Please arrive 15 minutes prior to your appointment time.  Please note: We ask at that you not bring children with you during ultrasound (echo/ vascular) testing. Due to room size and safety concerns, children are not allowed in the ultrasound rooms during exams. Our front office staff cannot provide observation of children in our lobby area while testing is being conducted. An adult accompanying a patient to their appointment will only be allowed in the ultrasound room at the discretion of the ultrasound technician under special circumstances. We apologize for any inconvenience.     Follow-Up:As scheduled At [facility], you and your health needs are our priority.  As part of our continuing mission to provide you with exceptional heart care, we have created designated Provider Care Teams.  These Care Teams include your  primary Cardiologist (physician) and Advanced Practice Providers (APPs -  Physician Assistants and Nurse Practitioners) who all work together to provide you with the care you need, when you need it.

## 2023-02-14 DIAGNOSIS — E785 Hyperlipidemia, unspecified: Secondary | ICD-10-CM | POA: Diagnosis not present

## 2023-02-14 DIAGNOSIS — E538 Deficiency of other specified B group vitamins: Secondary | ICD-10-CM | POA: Diagnosis not present

## 2023-02-14 DIAGNOSIS — I2510 Atherosclerotic heart disease of native coronary artery without angina pectoris: Secondary | ICD-10-CM | POA: Diagnosis not present

## 2023-02-14 DIAGNOSIS — E669 Obesity, unspecified: Secondary | ICD-10-CM | POA: Diagnosis not present

## 2023-02-14 DIAGNOSIS — M0579 Rheumatoid arthritis with rheumatoid factor of multiple sites without organ or systems involvement: Secondary | ICD-10-CM | POA: Diagnosis not present

## 2023-02-14 DIAGNOSIS — M069 Rheumatoid arthritis, unspecified: Secondary | ICD-10-CM | POA: Diagnosis not present

## 2023-02-14 DIAGNOSIS — R030 Elevated blood-pressure reading, without diagnosis of hypertension: Secondary | ICD-10-CM | POA: Diagnosis not present

## 2023-02-14 DIAGNOSIS — R7301 Impaired fasting glucose: Secondary | ICD-10-CM | POA: Diagnosis not present

## 2023-02-14 DIAGNOSIS — I350 Nonrheumatic aortic (valve) stenosis: Secondary | ICD-10-CM | POA: Diagnosis not present

## 2023-02-14 DIAGNOSIS — M810 Age-related osteoporosis without current pathological fracture: Secondary | ICD-10-CM | POA: Diagnosis not present

## 2023-02-14 DIAGNOSIS — B0089 Other herpesviral infection: Secondary | ICD-10-CM | POA: Diagnosis not present

## 2023-02-14 DIAGNOSIS — M25562 Pain in left knee: Secondary | ICD-10-CM | POA: Diagnosis not present

## 2023-02-14 DIAGNOSIS — M5431 Sciatica, right side: Secondary | ICD-10-CM | POA: Diagnosis not present

## 2023-02-16 ENCOUNTER — Telehealth (HOSPITAL_COMMUNITY): Payer: Self-pay | Admitting: *Deleted

## 2023-02-16 NOTE — Telephone Encounter (Signed)
 Spoke with patient. Appointment confirmed. Completed health history over the phone.Thayer Headings RN BSN

## 2023-02-17 DIAGNOSIS — Z96653 Presence of artificial knee joint, bilateral: Secondary | ICD-10-CM | POA: Diagnosis not present

## 2023-02-21 ENCOUNTER — Encounter (HOSPITAL_COMMUNITY): Payer: Self-pay

## 2023-02-21 ENCOUNTER — Encounter (HOSPITAL_COMMUNITY)
Admission: RE | Admit: 2023-02-21 | Discharge: 2023-02-21 | Disposition: A | Discharge status: Home / Self Care | Payer: Medicare Other | Source: Ambulatory Visit | Attending: Cardiovascular Disease | Admitting: Cardiovascular Disease

## 2023-02-21 VITALS — BP 112/70 | HR 65 | Ht 63.0 in | Wt 207.9 lb

## 2023-02-21 DIAGNOSIS — Z952 Presence of prosthetic heart valve: Secondary | ICD-10-CM | POA: Insufficient documentation

## 2023-02-21 DIAGNOSIS — Z48812 Encounter for surgical aftercare following surgery on the circulatory system: Secondary | ICD-10-CM | POA: Diagnosis not present

## 2023-02-21 NOTE — Progress Notes (Signed)
 Cardiac Rehab Medication Review by a Nurse  Does the patient  feel that his/her medications are working for him/her?  yes  Has the patient been experiencing any side effects to the medications prescribed?  yes  Does the patient measure his/her own blood pressure or blood glucose at home?  yes   Does the patient have any problems obtaining medications due to transportation or finances?   no  Understanding of regimen: good Understanding of indications: good Potential of compliance: good    Nurse comments: Malee is taking her medications as prescribed and has a good understanding of what her medications are for. Taylie has a blood pressure monitor. Joann does not check her blood pressure every day. Tinika says the RA drug she takes causes itching and a runny nose.Lucila takes benadryl  every day to alleviate those symptoms.    Hadassah Gaw Tequlia Gonsalves RN 02/21/2023 1:28 PM

## 2023-02-21 NOTE — Progress Notes (Signed)
 Cardiac Individual Treatment Plan  Patient Details  Name: [name] MRN: [id number] Date of Birth: [birth year] Referring Provider:   Flowsheet Row INTENSIVE CARDIAC REHAB ORIENT from 02/21/2023 in [facility]  Referring Provider Wonda Sharper, MD       Initial Encounter Date:  Flowsheet Row INTENSIVE CARDIAC REHAB ORIENT from 02/21/2023 in [facility] for Heart, Vascular, & Lung Health  Date 02/21/23       Visit Diagnosis: 01/04/23 TAVR (transcatheter aortic valve replacement)  Patient's Home Medications on Admission:  Current Outpatient Medications:    acetaminophen  (TYLENOL ) 500 MG tablet, Take 500 mg by mouth every 6 (six) hours as needed for headache., Disp: , Rfl:    aspirin  EC 81 MG tablet, Take 1 tablet (81 mg total) by mouth daily. Swallow whole., Disp: , Rfl:    Cholecalciferol (VITAMIN D3) 250 MCG (10000 UT) capsule, Take 10,000 Units by mouth every Monday, Wednesday, and Friday., Disp: , Rfl:    diphenhydrAMINE  (BENADRYL  ALLERGY) 25 MG tablet, Take 12.5 mg by mouth 2 (two) times daily., Disp: , Rfl:    diphenhydrAMINE  (BENADRYL ) 50 MG/ML injection, 50 mg Injection with infusions, Disp: , Rfl:    estrogens, conjugated, (PREMARIN) 0.3 MG tablet, Take 0.3 mg by mouth daily with breakfast. , Disp: , Rfl:    folic acid  (FOLVITE ) 1 MG tablet, Take 1 mg by mouth daily with breakfast., Disp: , Rfl:    furosemide  (LASIX ) 20 MG tablet, Take 1 tablet (20 mg total) by mouth daily., Disp: , Rfl:    methotrexate (RHEUMATREX) 2.5 MG tablet, Take 7.5 mg by mouth 2 (two) times a week. Caution:Chemotherapy. Protect from light. On Saturdays and Sundays, Disp: , Rfl:    Multiple Vitamins-Minerals (PRESERVISION AREDS 2 PO), Take 1 capsule by mouth 2 (two) times daily., Disp: , Rfl:    OVER THE COUNTER MEDICATION, Take 1 tablet by mouth daily. [employer]o supplement, Disp: , Rfl:    Propylene Glycol  (SYSTANE BALANCE OP), Place 1 drop into both eyes 2 (two) times daily as needed (dry eyes)., Disp: , Rfl:    Psyllium (METAMUCIL 3 IN 1 DAILY FIBER PO), Take 1-2 capsules by mouth daily., Disp: , Rfl:    rosuvastatin  (CRESTOR ) 20 MG tablet, Take 20 mg by mouth daily., Disp: , Rfl:    saccharomyces boulardii (FLORASTOR) 250 MG capsule, Take 250 mg by mouth every other day., Disp: , Rfl:    tocilizumab  (ACTEMRA ) 400 MG/20ML SOLN injection, Inject into the vein every 30 (thirty) days., Disp: , Rfl:    traMADol  (ULTRAM ) 50 MG tablet, Take 1-2 tablets (50-100 mg total) by mouth every 6 (six) hours as needed. (Patient taking differently: Take 25 mg by mouth daily.), Disp: 80 tablet, Rfl: 1   vitamin C (ASCORBIC ACID) 500 MG tablet, Take 500 mg by mouth every other day., Disp: , Rfl:    RESTASIS 0.05 % ophthalmic emulsion, Place 1 drop into both eyes 2 (two) times daily. (Patient not taking: Reported on 02/21/2023), Disp: , Rfl:   Past Medical History: Past Medical History:  Diagnosis Date   GERD (gastroesophageal reflux disease)    Hyperlipidemia    Hyperthyroidism    Hypothyroidism    Macular degeneration    Morbid obesity (HCC)    Osteoporosis    PONV (postoperative nausea and vomiting)    none recently   S/P TAVR (transcatheter aortic valve replacement) 01/03/2023   s/p TAVR with a  26 mm Medtronic FX via the TF approach by Dr. Wonda & [facility]   Severe aortic stenosis    Shingles 04/21/2014   seen by Dr Shayne and treated with Valtrex    Tobacco Use: Social History   Tobacco Use  Smoking Status Former   Current packs/day: 0.00   Average packs/day: 0.5 packs/day for 15.0 years (7.5 ttl pk-yrs)   Types: Cigarettes   Start date: 02/07/1990   Quit date: 02/07/2005   Years since quitting: 18.0  Smokeless Tobacco Never    Labs: Review Flowsheet       Latest Ref Rng & Units 09/15/2020 01/03/2023  Labs for ITP Cardiac and Pulmonary Rehab  Cholestrol 100 - 199 mg/dL 839  -  LDL (calc) 0 -  99 mg/dL 73  -  HDL-C >60 mg/dL 59  -  Trlycerides 0 - 149 mg/dL 830  -  TCO2 22 - 32 mmol/L - 28     Capillary Blood Glucose: Lab Results  Component Value Date   GLUCAP 101 (H) 01/04/2023   GLUCAP 129 (H) 01/03/2023   GLUCAP 87 01/03/2023   GLUCAP 89 12/26/2022     Exercise Target Goals: Exercise Program Goal: Individual exercise prescription set using results from initial 6 min walk test and THRR while considering  patient's activity barriers and safety.   Exercise Prescription Goal: Initial exercise prescription builds to 30-45 minutes a day of aerobic activity, 2-3 days per week.  Home exercise guidelines will be given to patient during program as part of exercise prescription that the participant will acknowledge.  Activity Barriers & Risk Stratification:  Activity Barriers & Cardiac Risk Stratification - 02/21/23 1426       Activity Barriers & Cardiac Risk Stratification   Activity Barriers Balance Concerns;Assistive Device;Left Knee Replacement;Right Knee Replacement;Other (comment);Arthritis    Comments Multiple hand surgeries, bilateral wrist bone removal.    Cardiac Risk Stratification High             6 Minute Walk:  6 Minute Walk     Row Name 02/21/23 1533         6 Minute Walk   Phase Initial     Distance 240 feet     Walk Time 6 minutes     # of Rest Breaks 0     MPH 0.45     METS 1.35     RPE 11     Perceived Dyspnea  0     Symptoms No     Resting HR 65 bpm     Resting BP 112/70     Resting Oxygen Saturation  94 %     Exercise Oxygen Saturation  during 6 min walk 93 %     Max Ex. HR 83 bpm     Max Ex. BP 160/82     2 Minute Post BP 158/74              Oxygen Initial Assessment:   Oxygen Re-Evaluation:   Oxygen Discharge (Final Oxygen Re-Evaluation):   Initial Exercise Prescription:  Initial Exercise Prescription - 02/21/23 1500       Date of Initial Exercise RX and Referring Provider   Date 02/21/23    Referring  Provider Wonda Sharper, MD    Expected Discharge Date 06/09/23      Recumbant Bike   Level 1    Watts 1    Minutes 15    METs 1.4      NuStep   Level 2  SPM 70    Minutes 15    METs 1.3      Prescription Details   Frequency (times per week) 3    Duration Progress to 30 minutes of continuous aerobic without signs/symptoms of physical distress      Intensity   THRR 40-80% of Max Heartrate 54-108    Ratings of Perceived Exertion 11-13    Perceived Dyspnea 0-4      Progression   Progression Continue to progress workloads to maintain intensity without signs/symptoms of physical distress.      Resistance Training   Training Prescription Yes    Weight 1 lb    Reps 10-15             Perform Capillary Blood Glucose checks as needed.  Exercise Prescription Changes:   Exercise Comments:   Exercise Goals and Review:   Exercise Goals     Row Name 02/21/23 1326             Exercise Goals   Increase Physical Activity Yes       Intervention Provide advice, education, support and counseling about physical activity/exercise needs.;Develop an individualized exercise prescription for aerobic and resistive training based on initial evaluation findings, risk stratification, comorbidities and participant's personal goals.       Expected Outcomes Short Term: Attend rehab on a regular basis to increase amount of physical activity.;Long Term: Add in home exercise to make exercise part of routine and to increase amount of physical activity.;Long Term: Exercising regularly at least 3-5 days a week.       Increase Strength and Stamina Yes       Intervention Provide advice, education, support and counseling about physical activity/exercise needs.;Develop an individualized exercise prescription for aerobic and resistive training based on initial evaluation findings, risk stratification, comorbidities and participant's personal goals.       Expected Outcomes Short Term: Increase  workloads from initial exercise prescription for resistance, speed, and METs.;Short Term: Perform resistance training exercises routinely during rehab and add in resistance training at home;Long Term: Improve cardiorespiratory fitness, muscular endurance and strength as measured by increased METs and functional capacity ( )       Able to understand and use rate of perceived exertion (RPE) scale Yes       Intervention Provide education and explanation on how to use RPE scale       Expected Outcomes Short Term: Able to use RPE daily in rehab to express subjective intensity level;Long Term:  Able to use RPE to guide intensity level when exercising independently       Knowledge and understanding of Target Heart Rate Range (THRR) Yes       Intervention Provide education and explanation of THRR including how the numbers were predicted and where they are located for reference       Expected Outcomes Short Term: Able to state/look up THRR;Long Term: Able to use THRR to govern intensity when exercising independently;Short Term: Able to use daily as guideline for intensity in rehab       Understanding of Exercise Prescription Yes       Intervention Provide education, explanation, and written materials on patient's individual exercise prescription       Expected Outcomes Short Term: Able to explain program exercise prescription;Long Term: Able to explain home exercise prescription to exercise independently                Exercise Goals Re-Evaluation :   Discharge Exercise Prescription (Final Exercise Prescription  Changes):   Nutrition:  Target Goals: Understanding of nutrition guidelines, daily intake of sodium 1500mg , cholesterol 200mg , calories 30% from fat and 7% or less from saturated fats, daily to have 5 or more servings of fruits and vegetables.  Biometrics:  Pre Biometrics - 02/21/23 1313       Pre Biometrics   Waist Circumference 48 inches    Hip Circumference 51 inches    Waist  to Hip Ratio 0.94 %    Triceps Skinfold 29 mm    % Body Fat 50.2 %    Grip Strength 12 kg    Flexibility --   Not performed. Bilateral knee replacement.   Single Leg Stand --   Not performed, balance concerns.             Nutrition Therapy Plan and Nutrition Goals:   Nutrition Assessments:  MEDIFICTS Score Key: >=70 Need to make dietary changes  40-70 Heart Healthy Diet <= 40 Therapeutic Level Cholesterol Diet    Picture Your Plate Scores: <59 Unhealthy dietary pattern with much room for improvement. 41-50 Dietary pattern unlikely to meet recommendations for good health and room for improvement. 51-60 More healthful dietary pattern, with some room for improvement.  >60 Healthy dietary pattern, although there may be some specific behaviors that could be improved.    Nutrition Goals Re-Evaluation:   Nutrition Goals Re-Evaluation:   Nutrition Goals Discharge (Final Nutrition Goals Re-Evaluation):   Psychosocial: Target Goals: Acknowledge presence or absence of significant depression and/or stress, maximize coping skills, provide positive support system. Participant is able to verbalize types and ability to use techniques and skills needed for reducing stress and depression.  Initial Review & Psychosocial Screening:  Initial Psych Review & Screening - 02/21/23 1437       Family Dynamics   Good Support System? Yes   Keeva lives alone. Jimmi has her 3 sons and friends who live in the area for support     Barriers   Psychosocial barriers to participate in program There are no identifiable barriers or psychosocial needs.      Screening Interventions   Interventions Encouraged to exercise             Quality of Life Scores:  Quality of Life - 02/21/23 1344       Quality of Life   Select Quality of Life      Quality of Life Scores   Health/Function Pre 21.54 %    Socioeconomic Pre 25.83 %    Psych/Spiritual Pre 23.57 %    Family Pre 28.5 %    GLOBAL  Pre 23.73 %            Scores of 19 and below usually indicate a poorer quality of life in these areas.  A difference of  2-3 points is a clinically meaningful difference.  A difference of 2-3 points in the total score of the Quality of Life Index has been associated with significant improvement in overall quality of life, self-image, physical symptoms, and general health in studies assessing change in quality of life.  PHQ-9: Review Flowsheet       02/21/2023  Depression screen PHQ 2/9  Decreased Interest 0  Down, Depressed, Hopeless 0  PHQ - 2 Score 0  Altered sleeping 0  Tired, decreased energy 0  Change in appetite 0  Feeling bad or failure about yourself  0  Trouble concentrating 0  Moving slowly or fidgety/restless 0  Suicidal thoughts 0  PHQ-9 Score 0  Difficult doing work/chores Not difficult at all   Interpretation of Total Score  Total Score Depression Severity:  1-4 = Minimal depression, 5-9 = Mild depression, 10-14 = Moderate depression, 15-19 = Moderately severe depression, 20-27 = Severe depression   Psychosocial Evaluation and Intervention:   Psychosocial Re-Evaluation:   Psychosocial Discharge (Final Psychosocial Re-Evaluation):   Vocational Rehabilitation: Provide vocational rehab assistance to qualifying candidates.   Vocational Rehab Evaluation & Intervention:  Vocational Rehab - 02/21/23 1438       Initial Vocational Rehab Evaluation & Intervention   Assessment shows need for Vocational Rehabilitation No   Yessika is retired and does not need vocational rehab at this time            Education: Education Goals: Education classes will be provided on a weekly basis, covering required topics. Participant will state understanding/return demonstration of topics presented.     Core Videos: Exercise    Move It!  Clinical staff conducted group or individual video education with verbal and written material and guidebook.  Patient learns the  recommended Pritikin exercise program. Exercise with the goal of living a long, healthy life. Some of the health benefits of exercise include controlled diabetes, healthier blood pressure levels, improved cholesterol levels, improved heart and lung capacity, improved sleep, and better body composition. Everyone should speak with their doctor before starting or changing an exercise routine.  Biomechanical Limitations Clinical staff conducted group or individual video education with verbal and written material and guidebook.  Patient learns how biomechanical limitations can impact exercise and how we can mitigate and possibly overcome limitations to have an impactful and balanced exercise routine.  Body Composition Clinical staff conducted group or individual video education with verbal and written material and guidebook.  Patient learns that body composition (ratio of muscle mass to fat mass) is a key component to assessing overall fitness, rather than body weight alone. Increased fat mass, especially visceral belly fat, can put us  at increased risk for metabolic syndrome, type 2 diabetes, heart disease, and even death. It is recommended to combine diet and exercise (cardiovascular and resistance training) to improve your body composition. Seek guidance from your physician and exercise physiologist before implementing an exercise routine.  Exercise Action Plan Clinical staff conducted group or individual video education with verbal and written material and guidebook.  Patient learns the recommended strategies to achieve and enjoy long-term exercise adherence, including variety, self-motivation, self-efficacy, and positive decision making. Benefits of exercise include fitness, good health, weight management, more energy, better sleep, less stress, and overall well-being.  Medical   Heart Disease Risk Reduction Clinical staff conducted group or individual video education with verbal and written material  and guidebook.  Patient learns our heart is our most vital organ as it circulates oxygen, nutrients, white blood cells, and hormones throughout the entire body, and carries waste away. Data supports a plant-based eating plan like the Pritikin Program for its effectiveness in slowing progression of and reversing heart disease. The video provides a number of recommendations to address heart disease.   Metabolic Syndrome and Belly Fat  Clinical staff conducted group or individual video education with verbal and written material and guidebook.  Patient learns what metabolic syndrome is, how it leads to heart disease, and how one can reverse it and keep it from coming back. You have metabolic syndrome if you have 3 of the following 5 criteria: abdominal obesity, high blood pressure, high triglycerides, low HDL cholesterol, and high blood sugar.  Hypertension and  Heart Disease Clinical staff conducted group or individual video education with verbal and written material and guidebook.  Patient learns that high blood pressure, or hypertension, is very common in the [place] . Hypertension is largely due to excessive salt intake, but other important risk factors include being overweight, physical inactivity, drinking too much alcohol , smoking, and not eating enough potassium from fruits and vegetables. High blood pressure is a leading risk factor for heart attack, stroke, congestive heart failure, dementia, kidney failure, and premature death. Long-term effects of excessive salt intake include stiffening of the arteries and thickening of heart muscle and organ damage. Recommendations include ways to reduce hypertension and the risk of heart disease.  Diseases of Our Time - Focusing on Diabetes Clinical staff conducted group or individual video education with verbal and written material and guidebook.  Patient learns why the best way to stop diseases of our time is prevention, through food and other lifestyle  changes. Medicine (such as prescription pills and surgeries) is often only a Band-Aid on the problem, not a long-term solution. Most common diseases of our time include obesity, type 2 diabetes, hypertension, heart disease, and cancer. The Pritikin Program is recommended and has been proven to help reduce, reverse, and/or prevent the damaging effects of metabolic syndrome.  Nutrition   Overview of the Pritikin Eating Plan  Clinical staff conducted group or individual video education with verbal and written material and guidebook.  Patient learns about the Pritikin Eating Plan for disease risk reduction. The Pritikin Eating Plan emphasizes a wide variety of unrefined, minimally-processed carbohydrates, like fruits, vegetables, whole grains, and legumes. Go, Caution, and Stop food choices are explained. Plant-based and lean animal proteins are emphasized. Rationale provided for low sodium intake for blood pressure control, low added sugars for blood sugar stabilization, and low added fats and oils for coronary artery disease risk reduction and weight management.  Calorie Density  Clinical staff conducted group or individual video education with verbal and written material and guidebook.  Patient learns about calorie density and how it impacts the Pritikin Eating Plan. Knowing the characteristics of the food you choose will help you decide whether those foods will lead to weight gain or weight loss, and whether you want to consume more or less of them. Weight loss is usually a side effect of the Pritikin Eating Plan because of its focus on low calorie-dense foods.  Label Reading  Clinical staff conducted group or individual video education with verbal and written material and guidebook.  Patient learns about the Pritikin recommended label reading guidelines and corresponding recommendations regarding calorie density, added sugars, sodium content, and whole grains.  Dining Out - Part 1  Clinical staff  conducted group or individual video education with verbal and written material and guidebook.  Patient learns that restaurant meals can be sabotaging because they can be so high in calories, fat, sodium, and/or sugar. Patient learns recommended strategies on how to positively address this and avoid unhealthy pitfalls.  Facts on Fats  Clinical staff conducted group or individual video education with verbal and written material and guidebook.  Patient learns that lifestyle modifications can be just as effective, if not more so, as many medications for lowering your risk of heart disease. A Pritikin lifestyle can help to reduce your risk of inflammation and atherosclerosis (cholesterol build-up, or plaque, in the artery walls). Lifestyle interventions such as dietary choices and physical activity address the cause of atherosclerosis. A review of the types of fats and their impact  on blood cholesterol levels, along with dietary recommendations to reduce fat intake is also included.  Nutrition Action Plan  Clinical staff conducted group or individual video education with verbal and written material and guidebook.  Patient learns how to incorporate Pritikin recommendations into their lifestyle. Recommendations include planning and keeping personal health goals in mind as an important part of their success.  Healthy Mind-Set    Healthy Minds, Bodies, Hearts  Clinical staff conducted group or individual video education with verbal and written material and guidebook.  Patient learns how to identify when they are stressed. Video will discuss the impact of that stress, as well as the many benefits of stress management. Patient will also be introduced to stress management techniques. The way we think, act, and feel has an impact on our hearts.  How Our Thoughts Can Heal Our Hearts  Clinical staff conducted group or individual video education with verbal and written material and guidebook.  Patient learns that  negative thoughts can cause depression and anxiety. This can result in negative lifestyle behavior and serious health problems. Cognitive behavioral therapy is an effective method to help control our thoughts in order to change and improve our emotional outlook.  Additional Videos:  Exercise    Improving Performance  Clinical staff conducted group or individual video education with verbal and written material and guidebook.  Patient learns to use a non-linear approach by alternating intensity levels and lengths of time spent exercising to help burn more calories and lose more body fat. Cardiovascular exercise helps improve heart health, metabolism, hormonal balance, blood sugar control, and recovery from fatigue. Resistance training improves strength, endurance, balance, coordination, reaction time, metabolism, and muscle mass. Flexibility exercise improves circulation, posture, and balance. Seek guidance from your physician and exercise physiologist before implementing an exercise routine and learn your capabilities and proper form for all exercise.  Introduction to Yoga  Clinical staff conducted group or individual video education with verbal and written material and guidebook.  Patient learns about yoga, a discipline of the coming together of mind, breath, and body. The benefits of yoga include improved flexibility, improved range of motion, better posture and core strength, increased lung function, weight loss, and positive self-image. Yoga's heart health benefits include lowered blood pressure, healthier heart rate, decreased cholesterol and triglyceride levels, improved immune function, and reduced stress. Seek guidance from your physician and exercise physiologist before implementing an exercise routine and learn your capabilities and proper form for all exercise.  Medical   Aging: Enhancing Your Quality of Life  Clinical staff conducted group or individual video education with verbal and  written material and guidebook.  Patient learns key strategies and recommendations to stay in good physical health and enhance quality of life, such as prevention strategies, having an advocate, securing a Health Care Proxy and Power of Attorney, and keeping a list of medications and system for tracking them. It also discusses how to avoid risk for bone loss.  Biology of Weight Control  Clinical staff conducted group or individual video education with verbal and written material and guidebook.  Patient learns that weight gain occurs because we consume more calories than we burn (eating more, moving less). Even if your body weight is normal, you may have higher ratios of fat compared to muscle mass. Too much body fat puts you at increased risk for cardiovascular disease, heart attack, stroke, type 2 diabetes, and obesity-related cancers. In addition to exercise, following the Pritikin Eating Plan can help reduce your risk.  Decoding Lab Results  Clinical staff conducted group or individual video education with verbal and written material and guidebook.  Patient learns that lab test reflects one measurement whose values change over time and are influenced by many factors, including medication, stress, sleep, exercise, food, hydration, pre-existing medical conditions, and more. It is recommended to use the knowledge from this video to become more involved with your lab results and evaluate your numbers to speak with your doctor.   Diseases of Our Time - Overview  Clinical staff conducted group or individual video education with verbal and written material and guidebook.  Patient learns that according to the CDC, 50% to 70% of chronic diseases (such as obesity, type 2 diabetes, elevated lipids, hypertension, and heart disease) are avoidable through lifestyle improvements including healthier food choices, listening to satiety cues, and increased physical activity.  Sleep Disorders Clinical staff  conducted group or individual video education with verbal and written material and guidebook.  Patient learns how good quality and duration of sleep are important to overall health and well-being. Patient also learns about sleep disorders and how they impact health along with recommendations to address them, including discussing with a physician.  Nutrition  Dining Out - Part 2 Clinical staff conducted group or individual video education with verbal and written material and guidebook.  Patient learns how to plan ahead and communicate in order to maximize their dining experience in a healthy and nutritious manner. Included are recommended food choices based on the type of restaurant the patient is visiting.   Fueling a Banker conducted group or individual video education with verbal and written material and guidebook.  There is a strong connection between our food choices and our health. Diseases like obesity and type 2 diabetes are very prevalent and are in large-part due to lifestyle choices. The Pritikin Eating Plan provides plenty of food and hunger-curbing satisfaction. It is easy to follow, affordable, and helps reduce health risks.  Menu Workshop  Clinical staff conducted group or individual video education with verbal and written material and guidebook.  Patient learns that restaurant meals can sabotage health goals because they are often packed with calories, fat, sodium, and sugar. Recommendations include strategies to plan ahead and to communicate with the manager, chef, or server to help order a healthier meal.  Planning Your Eating Strategy  Clinical staff conducted group or individual video education with verbal and written material and guidebook.  Patient learns about the Pritikin Eating Plan and its benefit of reducing the risk of disease. The Pritikin Eating Plan does not focus on calories. Instead, it emphasizes high-quality, nutrient-rich foods. By knowing  the characteristics of the foods, we choose, we can determine their calorie density and make informed decisions.  Targeting Your Nutrition Priorities  Clinical staff conducted group or individual video education with verbal and written material and guidebook.  Patient learns that lifestyle habits have a tremendous impact on disease risk and progression. This video provides eating and physical activity recommendations based on your personal health goals, such as reducing LDL cholesterol, losing weight, preventing or controlling type 2 diabetes, and reducing high blood pressure.  Vitamins and Minerals  Clinical staff conducted group or individual video education with verbal and written material and guidebook.  Patient learns different ways to obtain key vitamins and minerals, including through a recommended healthy diet. It is important to discuss all supplements you take with your doctor.   Healthy Mind-Set    Smoking Cessation  Clinical  staff conducted group or individual video education with verbal and written material and guidebook.  Patient learns that cigarette smoking and tobacco addiction pose a serious health risk which affects millions of people. Stopping smoking will significantly reduce the risk of heart disease, lung disease, and many forms of cancer. Recommended strategies for quitting are covered, including working with your doctor to develop a successful plan.  Culinary   Becoming a Set Designer conducted group or individual video education with verbal and written material and guidebook.  Patient learns that cooking at home can be healthy, cost-effective, quick, and puts them in control. Keys to cooking healthy recipes will include looking at your recipe, assessing your equipment needs, planning ahead, making it simple, choosing cost-effective seasonal ingredients, and limiting the use of added fats, salts, and sugars.  Cooking - Breakfast and Snacks  Clinical  staff conducted group or individual video education with verbal and written material and guidebook.  Patient learns how important breakfast is to satiety and nutrition through the entire day. Recommendations include key foods to eat during breakfast to help stabilize blood sugar levels and to prevent overeating at meals later in the day. Planning ahead is also a key component.  Cooking - Educational Psychologist conducted group or individual video education with verbal and written material and guidebook.  Patient learns eating strategies to improve overall health, including an approach to cook more at home. Recommendations include thinking of animal protein as a side on your plate rather than center stage and focusing instead on lower calorie dense options like vegetables, fruits, whole grains, and plant-based proteins, such as beans. Making sauces in large quantities to freeze for later and leaving the skin on your vegetables are also recommended to maximize your experience.  Cooking - Healthy Salads and Dressing Clinical staff conducted group or individual video education with verbal and written material and guidebook.  Patient learns that vegetables, fruits, whole grains, and legumes are the foundations of the Pritikin Eating Plan. Recommendations include how to incorporate each of these in flavorful and healthy salads, and how to create homemade salad dressings. Proper handling of ingredients is also covered. Cooking - Soups and [employer] - Soups and Desserts Clinical staff conducted group or individual video education with verbal and written material and guidebook.  Patient learns that Pritikin soups and desserts make for easy, nutritious, and delicious snacks and meal components that are low in sodium, fat, sugar, and calorie density, while high in vitamins, minerals, and filling fiber. Recommendations include simple and healthy ideas for soups and desserts.   Overview     The  Pritikin Solution Program Overview Clinical staff conducted group or individual video education with verbal and written material and guidebook.  Patient learns that the results of the Pritikin Program have been documented in more than 100 articles published in peer-reviewed journals, and the benefits include reducing risk factors for (and, in some cases, even reversing) high cholesterol, high blood pressure, type 2 diabetes, obesity, and more! An overview of the three key pillars of the Pritikin Program will be covered: eating well, doing regular exercise, and having a healthy mind-set.  WORKSHOPS  Exercise: Exercise Basics: Building Your Action Plan Clinical staff led group instruction and group discussion with PowerPoint presentation and patient guidebook. To enhance the learning environment the use of posters, models and videos may be added. At the conclusion of this workshop, patients will comprehend the difference between physical activity and exercise,  as well as the benefits of incorporating both, into their routine. Patients will understand the FITT (Frequency, Intensity, Time, and Type) principle and how to use it to build an exercise action plan. In addition, safety concerns and other considerations for exercise and cardiac rehab will be addressed by the presenter. The purpose of this lesson is to promote a comprehensive and effective weekly exercise routine in order to improve patients' overall level of fitness.   Managing Heart Disease: Your Path to a Healthier Heart Clinical staff led group instruction and group discussion with PowerPoint presentation and patient guidebook. To enhance the learning environment the use of posters, models and videos may be added.At the conclusion of this workshop, patients will understand the anatomy and physiology of the heart. Additionally, they will understand how Pritikin's three pillars impact the risk factors, the progression, and the management  of heart disease.  The purpose of this lesson is to provide a high-level overview of the heart, heart disease, and how the Pritikin lifestyle positively impacts risk factors.  Exercise Biomechanics Clinical staff led group instruction and group discussion with PowerPoint presentation and patient guidebook. To enhance the learning environment the use of posters, models and videos may be added. Patients will learn how the structural parts of their bodies function and how these functions impact their daily activities, movement, and exercise. Patients will learn how to promote a neutral spine, learn how to manage pain, and identify ways to improve their physical movement in order to promote healthy living. The purpose of this lesson is to expose patients to common physical limitations that impact physical activity. Participants will learn practical ways to adapt and manage aches and pains, and to minimize their effect on regular exercise. Patients will learn how to maintain good posture while sitting, walking, and lifting.  Balance Training and Fall Prevention  Clinical staff led group instruction and group discussion with PowerPoint presentation and patient guidebook. To enhance the learning environment the use of posters, models and videos may be added. At the conclusion of this workshop, patients will understand the importance of their sensorimotor skills (vision, proprioception, and the vestibular system) in maintaining their ability to balance as they age. Patients will apply a variety of balancing exercises that are appropriate for their current level of function. Patients will understand the common causes for poor balance, possible solutions to these problems, and ways to modify their physical environment in order to minimize their fall risk. The purpose of this lesson is to teach patients about the importance of maintaining balance as they age and ways to minimize their risk of  falling.  WORKSHOPS   Nutrition:  Fueling a Ship Broker led group instruction and group discussion with PowerPoint presentation and patient guidebook. To enhance the learning environment the use of posters, models and videos may be added. Patients will review the foundational principles of the Pritikin Eating Plan and understand what constitutes a serving size in each of the food groups. Patients will also learn Pritikin-friendly foods that are better choices when away from home and review make-ahead meal and snack options. Calorie density will be reviewed and applied to three nutrition priorities: weight maintenance, weight loss, and weight gain. The purpose of this lesson is to reinforce (in a group setting) the key concepts around what patients are recommended to eat and how to apply these guidelines when away from home by planning and selecting Pritikin-friendly options. Patients will understand how calorie density may be adjusted for different weight management  goals.  Mindful Eating  Clinical staff led group instruction and group discussion with PowerPoint presentation and patient guidebook. To enhance the learning environment the use of posters, models and videos may be added. Patients will briefly review the concepts of the Pritikin Eating Plan and the importance of low-calorie dense foods. The concept of mindful eating will be introduced as well as the importance of paying attention to internal hunger signals. Triggers for non-hunger eating and techniques for dealing with triggers will be explored. The purpose of this lesson is to provide patients with the opportunity to review the basic principles of the Pritikin Eating Plan, discuss the value of eating mindfully and how to measure internal cues of hunger and fullness using the Hunger Scale. Patients will also discuss reasons for non-hunger eating and learn strategies to use for controlling emotional eating.  Targeting Your  Nutrition Priorities Clinical staff led group instruction and group discussion with PowerPoint presentation and patient guidebook. To enhance the learning environment the use of posters, models and videos may be added. Patients will learn how to determine their genetic susceptibility to disease by reviewing their family history. Patients will gain insight into the importance of diet as part of an overall healthy lifestyle in mitigating the impact of genetics and other environmental insults. The purpose of this lesson is to provide patients with the opportunity to assess their personal nutrition priorities by looking at their family history, their own health history and current risk factors. Patients will also be able to discuss ways of prioritizing and modifying the Pritikin Eating Plan for their highest risk areas  Menu  Clinical staff led group instruction and group discussion with PowerPoint presentation and patient guidebook. To enhance the learning environment the use of posters, models and videos may be added. Using menus brought in from [employer], or printed from [employer], patients will apply the Pritikin dining out guidelines that were presented in the [employer] video. Patients will also be able to practice these guidelines in a variety of provided scenarios. The purpose of this lesson is to provide patients with the opportunity to practice hands-on learning of the Pritikin Dining Out guidelines with actual menus and practice scenarios.  Label Reading Clinical staff led group instruction and group discussion with PowerPoint presentation and patient guidebook. To enhance the learning environment the use of posters, models and videos may be added. Patients will review and discuss the Pritikin label reading guidelines presented in Pritikin's Label Reading Educational series video. Using fool labels brought in from local grocery stores and markets, patients will apply the  label reading guidelines and determine if the packaged food meet the Pritikin guidelines. The purpose of this lesson is to provide patients with the opportunity to review, discuss, and practice hands-on learning of the Pritikin Label Reading guidelines with actual packaged food labels. Cooking School  Pritikin's [employer] are designed to teach patients ways to prepare quick, simple, and affordable recipes at home. The importance of nutrition's role in chronic disease risk reduction is reflected in its emphasis in the overall Pritikin program. By learning how to prepare essential core Pritikin Eating Plan recipes, patients will increase control over what they eat; be able to customize the flavor of foods without the use of added salt, sugar, or fat; and improve the quality of the food they consume. By learning a set of core recipes which are easily assembled, quickly prepared, and affordable, patients are more likely to prepare more healthy foods at  home. These workshops focus on convenient breakfasts, simple entres, side dishes, and desserts which can be prepared with minimal effort and are consistent with nutrition recommendations for cardiovascular risk reduction. Cooking [employer] are taught by a armed forces logistics/support/administrative officer (RD) who has been trained by the [employer]. The chef or RD has a clear understanding of the importance of minimizing - if not completely eliminating - added fat, sugar, and sodium in recipes. Throughout the series of Cooking School Workshop sessions, patients will learn about healthy ingredients and efficient methods of cooking to build confidence in their capability to prepare    Cooking School weekly topics:  Adding Flavor- Sodium-Free  Fast and Healthy Breakfasts  Powerhouse Plant-Based Proteins  Satisfying Salads and Dressings  Simple Sides and Sauces  International Cuisine-Spotlight on the [employer] Zones  Delicious Desserts  Savory  Soups  [employer] - Meals in a Astronomer Appetizers and Snacks  Comforting Weekend Breakfasts  One-Pot Wonders   Fast Evening Meals  Landscape Architect Your Pritikin Plate  WORKSHOPS   Healthy Mindset (Psychosocial):  Focused Goals, Sustainable Changes Clinical staff led group instruction and group discussion with PowerPoint presentation and patient guidebook. To enhance the learning environment the use of posters, models and videos may be added. Patients will be able to apply effective goal setting strategies to establish at least one personal goal, and then take consistent, meaningful action toward that goal. They will learn to identify common barriers to achieving personal goals and develop strategies to overcome them. Patients will also gain an understanding of how our mind-set can impact our ability to achieve goals and the importance of cultivating a positive and growth-oriented mind-set. The purpose of this lesson is to provide patients with a deeper understanding of how to set and achieve personal goals, as well as the tools and strategies needed to overcome common obstacles which may arise along the way.  From Head to Heart: The Power of a Healthy Outlook  Clinical staff led group instruction and group discussion with PowerPoint presentation and patient guidebook. To enhance the learning environment the use of posters, models and videos may be added. Patients will be able to recognize and describe the impact of emotions and mood on physical health. They will discover the importance of self-care and explore self-care practices which may work for them. Patients will also learn how to utilize the 4 C's to cultivate a healthier outlook and better manage stress and challenges. The purpose of this lesson is to demonstrate to patients how a healthy outlook is an essential part of maintaining good health, especially as they continue their cardiac rehab journey.  Healthy  Sleep for a Healthy Heart Clinical staff led group instruction and group discussion with PowerPoint presentation and patient guidebook. To enhance the learning environment the use of posters, models and videos may be added. At the conclusion of this workshop, patients will be able to demonstrate knowledge of the importance of sleep to overall health, well-being, and quality of life. They will understand the symptoms of, and treatments for, common sleep disorders. Patients will also be able to identify daytime and nighttime behaviors which impact sleep, and they will be able to apply these tools to help manage sleep-related challenges. The purpose of this lesson is to provide patients with a general overview of sleep and outline the importance of quality sleep. Patients will learn about a few of the most common sleep disorders. Patients will also be introduced to  the concept of "sleep hygiene," and discover ways to self-manage certain sleeping problems through simple daily behavior changes. Finally, the workshop will motivate patients by clarifying the links between quality sleep and their goals of heart-healthy living.   Recognizing and Reducing Stress Clinical staff led group instruction and group discussion with PowerPoint presentation and patient guidebook. To enhance the learning environment the use of posters, models and videos may be added. At the conclusion of this workshop, patients will be able to understand the types of stress reactions, differentiate between acute and chronic stress, and recognize the impact that chronic stress has on their health. They will also be able to apply different coping mechanisms, such as reframing negative self-talk. Patients will have the opportunity to practice a variety of stress management techniques, such as deep abdominal breathing, progressive muscle relaxation, and/or guided imagery.  The purpose of this lesson is to educate patients on the role of stress in their  lives and to provide healthy techniques for coping with it.  Learning Barriers/Preferences:  Learning Barriers/Preferences - 02/21/23 1523       Learning Barriers/Preferences   Learning Barriers Sight;Exercise Concerns   wears reading glasses. Walks very slow due to multiple knee surgeries uses a rolling walker   Learning Preferences Verbal Instruction;Pictoral             Education Topics:  Knowledge Questionnaire Score:  Knowledge Questionnaire Score - 02/21/23 1344       Knowledge Questionnaire Score   Pre Score 24/24             Core Components/Risk Factors/Patient Goals at Admission:  Personal Goals and Risk Factors at Admission - 02/21/23 1344       Core Components/Risk Factors/Patient Goals on Admission    Weight Management Yes;Obesity;Weight Loss    Intervention Weight Management/Obesity: Establish reasonable short term and long term weight goals.;Obesity: Provide education and appropriate resources to help participant work on and attain dietary goals.    Expected Outcomes Short Term: Continue to assess and modify interventions until short term weight is achieved;Long Term: Adherence to nutrition and physical activity/exercise program aimed toward attainment of established weight goal;Weight Loss: Understanding of general recommendations for a balanced deficit meal plan, which promotes 1-2 lb weight loss per week and includes a negative energy balance of [phone] kcal/d    Hypertension Yes    Intervention Provide education on lifestyle modifcations including regular physical activity/exercise, weight management, moderate sodium restriction and increased consumption of fresh fruit, vegetables, and low fat dairy, alcohol  moderation, and smoking cessation.;Monitor prescription use compliance.    Expected Outcomes Short Term: Continued assessment and intervention until BP is < 140/54mm HG in hypertensive participants. < 130/11mm HG in hypertensive participants with  diabetes, heart failure or chronic kidney disease.;Long Term: Maintenance of blood pressure at goal levels.    Lipids Yes    Intervention Provide education and support for participant on nutrition & aerobic/resistive exercise along with prescribed medications to achieve LDL 70mg , HDL >40mg .    Expected Outcomes Short Term: Participant states understanding of desired cholesterol values and is compliant with medications prescribed. Participant is following exercise prescription and nutrition guidelines.;Long Term: Cholesterol controlled with medications as prescribed, with individualized exercise RX and with personalized nutrition plan. Value goals: LDL < 70mg , HDL > 40 mg.             Core Components/Risk Factors/Patient Goals Review:    Core Components/Risk Factors/Patient Goals at Discharge (Final Review):    ITP Comments:  ITP  Comments     Row Name 02/21/23 1313           ITP Comments Medical Director- Dr. Wilbert Bihari, MD. Introduction to the Pritikin Education Program / Intensive Cardiac Rehab. Reviewed initial orientation folder.                Comments: Neveah attended orientation for the cardiac rehabilitation program on  02/21/2023  to perform initial intake and exercise walk test. She was introduced to the [employer] education and orientation packet was reviewed. Completed 6-minute walk test, measurements, initial ITP, and exercise prescription. Vital signs stable. Telemetry-normal sinus rhythm, asymptomatic.   Service time was from 1303 to 1502.  Arnoldo CHRISTELLA Gal, MS, ACSM CEP

## 2023-02-27 ENCOUNTER — Encounter (HOSPITAL_COMMUNITY)
Admission: RE | Admit: 2023-02-27 | Discharge: 2023-02-27 | Disposition: A | Discharge status: Home / Self Care | Payer: Medicare Other | Source: Ambulatory Visit | Attending: Cardiovascular Disease

## 2023-02-27 DIAGNOSIS — Z952 Presence of prosthetic heart valve: Secondary | ICD-10-CM | POA: Diagnosis not present

## 2023-02-27 DIAGNOSIS — Z48812 Encounter for surgical aftercare following surgery on the circulatory system: Secondary | ICD-10-CM | POA: Diagnosis not present

## 2023-02-27 NOTE — Progress Notes (Signed)
Daily Session Note  Patient Details  Name: [name] MRN: [id number] Date of Birth: Dec 18, 1935 Referring Provider:   Flowsheet Row INTENSIVE CARDIAC REHAB ORIENT from 02/21/2023 in [facility]  Referring Provider Tonny Bollman, MD       Encounter Date: 02/27/2023  Check In:  Session Check In - 02/27/23 1124       Check-In   Supervising physician immediately available to respond to emergencies [facility] MD immediately available    Physician(s) Bernadene Person, NP    Location [facility]    Staff Present Cristy Hilts, MS, ACSM-CEP, Exercise Physiologist;Sheritha Louis, RN, BSN;Johnny Hale Bogus, MS, Exercise Physiologist;Jetta Dan Humphreys BS, ACSM-CEP, Exercise Physiologist;David Manus Gunning, MS, ACSM-CEP, CCRP, Exercise Physiologist    Virtual Visit No    Medication changes reported     No    Fall or balance concerns reported    No    Tobacco Cessation No Change    Warm-up and Cool-down Performed as group-led instruction   Cardiac Rehab Orientation   Resistance Training Performed Yes    VAD Patient? No    PAD/SET Patient? No      Pain Assessment   Currently in Pain? No/denies    Multiple Pain Sites No             Capillary Blood Glucose: No results found for this or any previous visit (from the past 24 hours).   Exercise Prescription Changes - 02/27/23 1032       Response to Exercise   Blood Pressure (Admit) 108/68    Blood Pressure (Exercise) 142/72    Blood Pressure (Exit) 112/64    Heart Rate (Admit) 67 bpm    Heart Rate (Exercise) 85 bpm    Heart Rate (Exit) 71 bpm    Rating of Perceived Exertion (Exercise) 10    Symptoms None    Comments Off to a fair start with exercise.    Duration Progress to 30 minutes of  aerobic without signs/symptoms of physical distress    Intensity THRR unchanged      Progression   Progression Continue to progress workloads to maintain intensity without  signs/symptoms of physical distress.    Average METs 1.5      Resistance Training   Training Prescription Yes    Weight 1 lb    Reps 10-15    Time 10 Minutes      Interval Training   Interval Training No      NuStep   Level 2    SPM 60    Minutes 15    METs 1.5             Social History   Tobacco Use  Smoking Status Former   Current packs/day: 0.00   Average packs/day: 0.5 packs/day for 15.0 years (7.5 ttl pk-yrs)   Types: Cigarettes   Start date: 02/07/1990   Quit date: 02/07/2005   Years since quitting: 18.0  Smokeless Tobacco Never    Goals Met:  Exercise tolerated well No report of concerns or symptoms today Strength training completed today  Goals Unmet:  Not Applicable  Comments: Pt started cardiac rehab today.  Pt tolerated light exercise without difficulty. VSS, telemetry-Sinus rhythm with a first degree heart block. , asymptomatic.  Medication list reconciled. Pt denies barriers to medicaiton compliance.  PSYCHOSOCIAL ASSESSMENT:  PHQ-0. Pt exhibits positive coping skills, hopeful outlook with supportive family. No psychosocial needs identified at this time, no psychosocial interventions  necessary.    Pt enjoys participating in her book club.   Pt oriented to exercise equipment and routine.    Understanding verbalized. Patient is deconditioned walks very slow. Uses a rolling walker.Thayer Headings RN BSN    Dr. Armanda Magic is Medical Director for Cardiac Rehab at [facility].

## 2023-02-28 ENCOUNTER — Encounter: Payer: Self-pay | Admitting: Internal Medicine

## 2023-02-28 ENCOUNTER — Ambulatory Visit: Payer: Medicare Other | Admitting: Internal Medicine

## 2023-02-28 ENCOUNTER — Other Ambulatory Visit: Payer: Self-pay | Admitting: Cardiovascular Disease

## 2023-02-28 VITALS — BP 138/74 | HR 72 | Ht 63.5 in | Wt 208.0 lb

## 2023-02-28 DIAGNOSIS — R195 Other fecal abnormalities: Secondary | ICD-10-CM | POA: Diagnosis not present

## 2023-02-28 DIAGNOSIS — Z860101 Personal history of adenomatous and serrated colon polyps: Secondary | ICD-10-CM | POA: Diagnosis not present

## 2023-02-28 DIAGNOSIS — I10 Essential (primary) hypertension: Secondary | ICD-10-CM | POA: Diagnosis not present

## 2023-02-28 DIAGNOSIS — Z860100 Personal history of colon polyps, unspecified: Secondary | ICD-10-CM

## 2023-02-28 DIAGNOSIS — Z952 Presence of prosthetic heart valve: Secondary | ICD-10-CM

## 2023-02-28 LAB — BASIC METABOLIC PANEL
BUN/Creatinine Ratio: 25 (ref 12–28)
BUN: 17 mg/dL (ref 8–27)
CO2: 28 mmol/L (ref 20–29)
Calcium: 10.2 mg/dL (ref 8.7–10.3)
Chloride: 100 mmol/L (ref 96–106)
Creatinine, Ser: 0.67 mg/dL (ref 0.57–1.00)
Glucose: 106 mg/dL — ABNORMAL HIGH (ref 70–99)
Potassium: 4.1 mmol/L (ref 3.5–5.2)
Sodium: 141 mmol/L (ref 134–144)
eGFR: 86 mL/min/{1.73_m2} (ref >59)

## 2023-02-28 NOTE — Patient Instructions (Signed)
Please follow up with Dr. Rhea Belton in 6 months. We do not have a schedule available at this time. Please contact our office in a few months to schedule.   _______________________________________________________  If your blood pressure at your visit was 140/90 or greater, please contact your primary care physician to follow up on this.  _______________________________________________________  If you are age 86 or older, your body mass index should be between 23-30. Your Body mass index is 36.27 kg/m. If this is out of the aforementioned range listed, please consider follow up with your Primary Care Provider.  If you are age 30 or younger, your body mass index should be between 19-25. Your Body mass index is 36.27 kg/m. If this is out of the aformentioned range listed, please consider follow up with your Primary Care Provider.   ________________________________________________________  The [place] [facility] providers would like to encourage you to use [facility] to communicate with providers for non-urgent requests or questions.  Due to long hold times on the telephone, sending your provider a message by [facility] may be a faster and more efficient way to get a response.  Please allow 48 business hours for a response.  Please remember that this is for non-urgent requests.  _______________________________________________________

## 2023-02-28 NOTE — Progress Notes (Signed)
Subjective:    Patient ID: [name], female    DOB: 22-May-1935, 88 y.o.   MRN: [id number]  HPI [name] is an 88 year old female with a history of low risk/subcentimeter adenoma of the colon, diverticulosis, severe aortic stenosis status post recent TAVR with resolution of aortic stenosis and full recovery of EF, hypothyroidism, hyperlipidemia, rheumatoid arthritis and a positive Cologuard within the last 6 months who is here for follow-up.  She is here alone today.  Since I saw her she did meet Dr. Excell Seltzer and underwent successful TAVR.  Her dizziness, lightheadedness and weakness has all improved.  No chest pain or shortness of breath.  She started cardiac rehab yesterday.  In the workup she had a CTA of her chest abdomen and pelvis.  A small subdiaphragmatic lesion was seen leading to a PET scan.  The GI tract was all normal with the exception of sigmoid diverticulosis on both of the scans.  The subcentimeter diaphragmatic lesion as well as kidney lesions were all considered benign.  Her bowel habits have remained normal.  She has 1 regular bowel movement per day.  No blood in stool or melena.  No abdominal pain.  No thin stools or change in bowel characteristics or habits.  She is hoping to avoid colonoscopy   Review of Systems As per HPI, otherwise negative  Current Medications, Allergies, Past Medical History, Past Surgical History, Family History and Social History were reviewed in [employer] record.    Objective:   Physical Exam BP 138/74   Pulse 72   Ht 5' 3.5" (1.613 m)   Wt 208 lb (94.3 kg)   BMI 36.27 kg/m  Gen: awake, alert, NAD HEENT: anicteric  CV: RRR, no mrg Pulm: CTA b/l Abd: soft, NT/ND, +BS throughout Ext: no c/c/e Neuro: nonfocal  CBC    Component Value Date/Time   WBC 6.6 01/13/2023 1044   WBC 4.5 01/04/2023 0327   RBC 4.14 01/13/2023 1044   RBC 3.53 (L) 01/04/2023 0327   HGB 14.2 01/13/2023 1044   HCT 44.4  01/13/2023 1044   PLT 144 (L) 01/13/2023 1044   MCV 107 (H) 01/13/2023 1044   MCH 34.3 (H) 01/13/2023 1044   MCH 34.8 (H) 01/04/2023 0327   MCHC 32.0 01/13/2023 1044   MCHC 32.3 01/04/2023 0327   RDW 12.2 01/13/2023 1044   LYMPHSABS 1.3 02/10/2016 1037   MONOABS 0.9 02/10/2016 1037   EOSABS 0.2 02/10/2016 1037   BASOSABS 0.0 02/10/2016 1037     Echocardiogram 02/10/2023 IMPRESSION:   1. Left ventricular ejection fraction, by estimation, is 55 to 60%. The  left ventricle has normal function. The left ventricle has no regional  wall motion abnormalities. There is moderate left ventricular hypertrophy.  Left ventricular diastolic  parameters are consistent with Grade I diastolic dysfunction (impaired  relaxation). Elevated left ventricular end-diastolic pressure. The average  left ventricular global longitudinal strain is 16.3 %. The global  longitudinal strain is abnormal.   2. Right ventricular systolic function is normal. The right ventricular  size is normal. There is normal pulmonary artery systolic pressure.   3. The mitral valve is normal in structure. Trivial mitral valve  regurgitation. No evidence of mitral stenosis.   4. The aortic valve has been repaired/replaced. Aortic valve  regurgitation is not visualized. No aortic stenosis is present. There is a  CoreValve-Evolut Pro prosthetic (TAVR) valve present in the aortic  position.   5. The inferior vena cava is normal in  size with greater than 50%  respiratory variability, suggesting right atrial pressure of 3 mmHg.   NUCLEAR MEDICINE PET SKULL BASE TO THIGH   TECHNIQUE: 11.1 mCi F-18 FDG was injected intravenously. Full-ring PET imaging was performed from the skull base to thigh after the radiotracer. CT data was obtained and used for attenuation correction and anatomic localization.   Fasting blood glucose: 89 mg/dl   COMPARISON:  CTA chest abdomen pelvis dated 12/12/2022   FINDINGS: Mediastinal blood pool  activity: SUV max 3.2   Liver activity: SUV max NA   NECK: No hypermetabolic lymph nodes in the neck.   Incidental CT findings: None.   CHEST: No hypermetabolic mediastinal or hilar nodes. No suspicious pulmonary nodules on the CT scan.   Incidental CT findings: Atherosclerotic calcifications of the aortic arch. Moderate coronary atherosclerosis of the LAD and left circumflex.   ABDOMEN/PELVIS: 2.2 cm hyperdense/hemorrhagic lesion along the left diaphragmatic crus in abutting the medial aspect of the adrenal gland (series 4/image 107), non FDG avid, max SUV 1.9. This appearance may reflect postsurgical change versus a small hematoma.   18 mm hyperdense/hemorrhagic lesion along the posterior left lower kidney, non FDG avid (max SUV 1.6), favoring a benign hemorrhagic cyst.   No abnormal hypermetabolism in the liver, spleen, pancreas, or adrenal glands.   No hypermetabolic abdominopelvic lymphadenopathy.   Incidental CT findings: Status post cholecystectomy. Sigmoid diverticulosis, without diverticulitis. Small fat containing periumbilical hernia. Status post hysterectomy. Atherosclerotic calcifications of the abdominal aorta and branch vessels.   SKELETON: No focal hypermetabolic activity to suggest skeletal metastasis.   Incidental CT findings: Degenerative changes of the visualized thoracolumbar spine.   IMPRESSION: No findings suspicious for malignancy.   Hemorrhagic left lower pole renal cyst, benign (Bosniak II). No follow-up is recommended.   Additional hyperdense/hemorrhagic lesion along the left diaphragmatic crus, favoring postsurgical change versus a small hematoma, benign.     Electronically Signed   By: Charline Bills M.D.   On: 12/27/2022 00:14    CTA ABDOMEN AND PELVIS FINDINGS   Hepatobiliary: Normal liver with no liver mass. Cholecystectomy. No biliary ductal dilatation.   Pancreas: Normal, with no mass or duct dilation.   Spleen:  Normal size. No mass.   Adrenals/Urinary Tract: Normal adrenals. Hypodense exophytic 1.8 cm posterior lower left renal cortical lesion (series 7/image 127). No additional contour deforming renal lesions. No hydronephrosis. Normal bladder.   Stomach/Bowel: Normal non-distended stomach. Normal caliber small bowel with no small bowel wall thickening. Normal appendix. Marked sigmoid diverticulosis with no large bowel wall thickening or significant pericolonic fat stranding.   Vascular/Lymphatic: Atherosclerotic nonaneurysmal abdominal aorta. High left retroperitoneal 2.2 x 1.9 cm solid mass abutting the left diaphragmatic crus and medial margin of the left adrenal gland (series 7/image 103), not previously imaged. Otherwise, no pathologically enlarged lymph nodes in the abdomen or pelvis.   Reproductive: Status post hysterectomy, with no abnormal findings at the vaginal cuff. No adnexal mass.   Other: No pneumoperitoneum, ascites or focal fluid collection. Small fat containing umbilical hernia.   Musculoskeletal: No aggressive appearing focal osseous lesions. Moderate lumbar degenerative disc disease.   VASCULAR MEASUREMENTS PERTINENT TO TAVR:   AORTA:   Minimal Aortic Diameter-14.0 x 12.6 mm   Severity of Aortic Calcification-moderate   RIGHT PELVIS:   Right Common Iliac Artery -   Minimal Diameter-9.3 x 8.9 mm   Tortuosity-mild   Calcification-moderate to severe   Right External Iliac Artery -   Minimal Diameter-7.0 x 7.0 mm  Tortuosity-mild   Calcification-moderate   Right Common Femoral Artery -   Minimal Diameter-6.9 x 5.3 mm   Tortuosity-mild   Calcification-moderate   LEFT PELVIS:   Left Common Iliac Artery -   Minimal Diameter-10.4 x 9.6 mm   Tortuosity-mild   Calcification-severe   Left External Iliac Artery -   Minimal Diameter-7.0 x 6.0 mm   Tortuosity-mild   Calcification-moderate   Left Common Femoral Artery -   Minimal  Diameter-5.8 x 5.1 mm   Tortuosity-mild   Calcification-moderate to severe   Review of the MIP images confirms the above findings.   IMPRESSION: 1. Vascular findings and measurements pertinent to potential TAVR procedure, as described. 2. Diffusely coarsely calcified and thickened aortic valve, compatible with reported aortic stenosis. 3. Indeterminate high left retroperitoneal 2.2 x 1.9 cm solid mass abutting the left diaphragmatic crus and medial margin of the left adrenal gland, not previously imaged. Malignancy not excluded. Suggest further evaluation with PET-CT at this time. 4. Hypodense exophytic 1.8 cm posterior lower left renal cortical lesion, indeterminate, cannot exclude a solid renal neoplasm. MRI (preferred) or CT abdomen without and with IV contrast is indicated for further evaluation. 5. Three-vessel coronary atherosclerosis. 6. Marked sigmoid diverticulosis. 7.  Aortic Atherosclerosis (ICD10-I70.0).     Electronically Signed   By: Delbert Phenix M.D.   On: 12/12/2022 12:33    Assessment & Plan:  88 year old female with a history of low risk/subcentimeter adenoma of the colon, diverticulosis, severe aortic stenosis status post recent TAVR with resolution of aortic stenosis and full recovery of EF, hypothyroidism, hyperlipidemia, rheumatoid arthritis and a positive Cologuard within the last 6 months who is here for follow-up.   Positive Cologuard and history of 5 mm rectal adenoma at colonoscopy in 2019 --she had a positive Cologuard in 2019 that led to her last colonoscopy.  Cologuard was again positive last summer.  This is not terribly surprising and her chance of having an advanced polyp or cancer is considered very low.  This is also based on recent CT and PET scan imaging which shows no concerning GI or colonic lesions.  She is hoping to avoid Cologuard and also understands there is a high risk of false positives at her age.  Given her recent TAVR her cardiac  function is much better than it was when I last saw her however she has just started cardiac rehab.  We had a long frank discussion regarding colonoscopy, she wishes to avoid if possible.  Again I think the chances of finding a high risk lesion and very very low.  Based on all of this in our discussions we will defer colonoscopy for now -- Follow-up with me in 6 months at which time we can rediscuss colonoscopy -- Would not perform repeat Cologuard in the future  2.  Aortic stenosis status post TAVR --much improved as above, continue cardiac rehab  30 minutes total spent today including patient facing time, coordination of care, reviewing medical history/procedures/pertinent radiology studies, and documentation of the encounter.

## 2023-03-01 ENCOUNTER — Encounter (HOSPITAL_COMMUNITY): Payer: Medicare Other

## 2023-03-01 ENCOUNTER — Telehealth (HOSPITAL_COMMUNITY): Payer: Self-pay | Admitting: *Deleted

## 2023-03-01 NOTE — Telephone Encounter (Signed)
[name] called out today due to snow on her porch/ fall risk. Plans to return on Friday.

## 2023-03-02 DIAGNOSIS — Z79899 Other long term (current) drug therapy: Secondary | ICD-10-CM | POA: Diagnosis not present

## 2023-03-02 DIAGNOSIS — E669 Obesity, unspecified: Secondary | ICD-10-CM | POA: Diagnosis not present

## 2023-03-02 DIAGNOSIS — M1991 Primary osteoarthritis, unspecified site: Secondary | ICD-10-CM | POA: Diagnosis not present

## 2023-03-02 DIAGNOSIS — M5431 Sciatica, right side: Secondary | ICD-10-CM | POA: Diagnosis not present

## 2023-03-02 DIAGNOSIS — M810 Age-related osteoporosis without current pathological fracture: Secondary | ICD-10-CM | POA: Diagnosis not present

## 2023-03-02 DIAGNOSIS — Z6836 Body mass index (BMI) 36.0-36.9, adult: Secondary | ICD-10-CM | POA: Diagnosis not present

## 2023-03-02 DIAGNOSIS — M0579 Rheumatoid arthritis with rheumatoid factor of multiple sites without organ or systems involvement: Secondary | ICD-10-CM | POA: Diagnosis not present

## 2023-03-03 ENCOUNTER — Encounter (HOSPITAL_COMMUNITY)
Admission: RE | Admit: 2023-03-03 | Discharge: 2023-03-03 | Disposition: A | Discharge status: Home / Self Care | Payer: Medicare Other | Source: Ambulatory Visit | Attending: Cardiovascular Disease

## 2023-03-03 DIAGNOSIS — Z952 Presence of prosthetic heart valve: Secondary | ICD-10-CM

## 2023-03-03 DIAGNOSIS — Z48812 Encounter for surgical aftercare following surgery on the circulatory system: Secondary | ICD-10-CM | POA: Diagnosis not present

## 2023-03-06 ENCOUNTER — Encounter (HOSPITAL_COMMUNITY)
Admission: RE | Admit: 2023-03-06 | Discharge: 2023-03-06 | Disposition: A | Discharge status: Home / Self Care | Payer: Medicare Other | Source: Ambulatory Visit | Attending: Cardiovascular Disease

## 2023-03-06 DIAGNOSIS — Z952 Presence of prosthetic heart valve: Secondary | ICD-10-CM

## 2023-03-06 DIAGNOSIS — Z48812 Encounter for surgical aftercare following surgery on the circulatory system: Secondary | ICD-10-CM | POA: Diagnosis not present

## 2023-03-08 ENCOUNTER — Encounter (HOSPITAL_COMMUNITY)
Admission: RE | Admit: 2023-03-08 | Discharge: 2023-03-08 | Disposition: A | Discharge status: Home / Self Care | Payer: Medicare Other | Source: Ambulatory Visit | Attending: Cardiovascular Disease | Admitting: Cardiovascular Disease

## 2023-03-08 DIAGNOSIS — Z952 Presence of prosthetic heart valve: Secondary | ICD-10-CM | POA: Diagnosis not present

## 2023-03-08 DIAGNOSIS — Z48812 Encounter for surgical aftercare following surgery on the circulatory system: Secondary | ICD-10-CM | POA: Diagnosis not present

## 2023-03-10 ENCOUNTER — Encounter (HOSPITAL_COMMUNITY)
Admission: RE | Admit: 2023-03-10 | Discharge: 2023-03-10 | Disposition: A | Discharge status: Home / Self Care | Payer: Medicare Other | Source: Ambulatory Visit | Attending: Cardiovascular Disease | Admitting: Cardiovascular Disease

## 2023-03-10 DIAGNOSIS — Z48812 Encounter for surgical aftercare following surgery on the circulatory system: Secondary | ICD-10-CM | POA: Diagnosis not present

## 2023-03-10 DIAGNOSIS — Z952 Presence of prosthetic heart valve: Secondary | ICD-10-CM | POA: Diagnosis not present

## 2023-03-13 ENCOUNTER — Encounter (HOSPITAL_COMMUNITY)
Admission: RE | Admit: 2023-03-13 | Discharge: 2023-03-13 | Disposition: A | Discharge status: Home / Self Care | Payer: Medicare Other | Source: Ambulatory Visit | Attending: Cardiovascular Disease | Admitting: Cardiovascular Disease

## 2023-03-13 DIAGNOSIS — Z952 Presence of prosthetic heart valve: Secondary | ICD-10-CM | POA: Diagnosis not present

## 2023-03-13 NOTE — Progress Notes (Signed)
Cardiac Individual Treatment Plan  Patient Details  Name: [name] MRN: [id number] Date of Birth: 10-11-1935 Referring Provider:   Flowsheet Row INTENSIVE CARDIAC REHAB ORIENT from 02/21/2023 in [facility]  Referring Provider Tonny Bollman, MD       Initial Encounter Date:  Flowsheet Row INTENSIVE CARDIAC REHAB ORIENT from 02/21/2023 in [facility]  Date 02/21/23       Visit Diagnosis: 01/04/23 TAVR (transcatheter aortic valve replacement)  Patient's Home Medications on Admission:  Current Outpatient Medications:    acetaminophen (TYLENOL) 500 MG tablet, Take 500 mg by mouth every 6 (six) hours as needed for headache., Disp: , Rfl:    aspirin EC 81 MG tablet, Take 1 tablet (81 mg total) by mouth daily. Swallow whole., Disp: , Rfl:    Cholecalciferol (VITAMIN D3) 250 MCG (10000 UT) capsule, Take 10,000 Units by mouth every Monday, Wednesday, and Friday., Disp: , Rfl:    diphenhydrAMINE (BENADRYL ALLERGY) 25 MG tablet, Take 12.5 mg by mouth 2 (two) times daily., Disp: , Rfl:    diphenhydrAMINE (BENADRYL) 50 MG/ML injection, 50 mg Injection with infusions, Disp: , Rfl:    estrogens, conjugated, (PREMARIN) 0.3 MG tablet, Take 0.3 mg by mouth daily with breakfast. , Disp: , Rfl:    folic acid (FOLVITE) 1 MG tablet, Take 1 mg by mouth daily with breakfast., Disp: , Rfl:    furosemide (LASIX) 20 MG tablet, Take 1 tablet (20 mg total) by mouth daily., Disp: , Rfl:    methotrexate (RHEUMATREX) 2.5 MG tablet, Take 7.5 mg by mouth 2 (two) times a week. Caution:Chemotherapy. Protect from light. On Saturdays and Sundays, Disp: , Rfl:    Multiple Vitamins-Minerals (PRESERVISION AREDS 2 PO), Take 1 capsule by mouth 2 (two) times daily., Disp: , Rfl:    OVER THE COUNTER MEDICATION, Take 1 tablet by mouth daily. [employer]o supplement, Disp: , Rfl:    Propylene Glycol  (SYSTANE BALANCE OP), Place 1 drop into both eyes 2 (two) times daily as needed (dry eyes)., Disp: , Rfl:    Psyllium (METAMUCIL 3 IN 1 DAILY FIBER PO), Take 1-2 capsules by mouth daily., Disp: , Rfl:    RESTASIS 0.05 % ophthalmic emulsion, Place 1 drop into both eyes 2 (two) times daily., Disp: , Rfl:    rosuvastatin (CRESTOR) 20 MG tablet, Take 20 mg by mouth daily., Disp: , Rfl:    saccharomyces boulardii (FLORASTOR) 250 MG capsule, Take 250 mg by mouth every other day., Disp: , Rfl:    tocilizumab (ACTEMRA) 400 MG/20ML SOLN injection, Inject into the vein every 30 (thirty) days., Disp: , Rfl:    traMADol (ULTRAM) 50 MG tablet, Take 1-2 tablets (50-100 mg total) by mouth every 6 (six) hours as needed. (Patient taking differently: Take 25 mg by mouth daily.), Disp: 80 tablet, Rfl: 1   vitamin C (ASCORBIC ACID) 500 MG tablet, Take 500 mg by mouth every other day., Disp: , Rfl:   Past Medical History: Past Medical History:  Diagnosis Date   GERD (gastroesophageal reflux disease)    Hyperlipidemia    Hyperthyroidism    Hypothyroidism    Macular degeneration    Morbid obesity (HCC)    Osteoporosis    PONV (postoperative nausea and vomiting)    none recently   S/P TAVR (transcatheter aortic valve replacement) 01/03/2023   s/p TAVR with a 26 mm Medtronic FX via the  TF approach by Dr. Excell Seltzer & [facility]   Severe aortic stenosis    Shingles 04/21/2014   seen by Dr Waynard Edwards and treated with Valtrex    Tobacco Use: Social History   Tobacco Use  Smoking Status Former   Current packs/day: 0.00   Average packs/day: 0.5 packs/day for 15.0 years (7.5 ttl pk-yrs)   Types: Cigarettes   Start date: 02/07/1990   Quit date: 02/07/2005   Years since quitting: 18.1  Smokeless Tobacco Never    Labs: Review Flowsheet       Latest Ref Rng & Units 09/15/2020 01/03/2023  Labs for ITP Cardiac and Pulmonary Rehab  Cholestrol 100 - 199 mg/dL 409  -  LDL (calc) 0 - 99 mg/dL 73  -  HDL-C >81 mg/dL 59  -   Trlycerides 0 - 149 mg/dL 191  -  TCO2 22 - 32 mmol/L - 28     Capillary Blood Glucose: Lab Results  Component Value Date   GLUCAP 101 (H) 01/04/2023   GLUCAP 129 (H) 01/03/2023   GLUCAP 87 01/03/2023   GLUCAP 89 12/26/2022     Exercise Target Goals: Exercise Program Goal: Individual exercise prescription set using results from initial 6 min walk test and THRR while considering  patient's activity barriers and safety.   Exercise Prescription Goal: Initial exercise prescription builds to 30-45 minutes a day of aerobic activity, 2-3 days per week.  Home exercise guidelines will be given to patient during program as part of exercise prescription that the participant will acknowledge.  Activity Barriers & Risk Stratification:  Activity Barriers & Cardiac Risk Stratification - 02/21/23 1426       Activity Barriers & Cardiac Risk Stratification   Activity Barriers Balance Concerns;Assistive Device;Left Knee Replacement;Right Knee Replacement;Other (comment);Arthritis    Comments Multiple hand surgeries, bilateral wrist bone removal.    Cardiac Risk Stratification High             6 Minute Walk:  6 Minute Walk     Row Name 02/21/23 1533         6 Minute Walk   Phase Initial     Distance 240 feet     Walk Time 6 minutes     # of Rest Breaks 0     MPH 0.45     METS 1.35     RPE 11     Perceived Dyspnea  0     Symptoms No     Resting HR 65 bpm     Resting BP 112/70     Resting Oxygen Saturation  94 %     Exercise Oxygen Saturation  during 6 min walk 93 %     Max Ex. HR 83 bpm     Max Ex. BP 160/82     2 Minute Post BP 158/74              Oxygen Initial Assessment:   Oxygen Re-Evaluation:   Oxygen Discharge (Final Oxygen Re-Evaluation):   Initial Exercise Prescription:  Initial Exercise Prescription - 02/21/23 1500       Date of Initial Exercise RX and Referring Provider   Date 02/21/23    Referring Provider Tonny Bollman, MD    Expected  Discharge Date 06/09/23      Recumbant Bike   Level 1    Watts 1    Minutes 15    METs 1.4      NuStep   Level 2    SPM 70  Minutes 15    METs 1.3      Prescription Details   Frequency (times per week) 3    Duration Progress to 30 minutes of continuous aerobic without signs/symptoms of physical distress      Intensity   THRR 40-80% of Max Heartrate 54-108    Ratings of Perceived Exertion 11-13    Perceived Dyspnea 0-4      Progression   Progression Continue to progress workloads to maintain intensity without signs/symptoms of physical distress.      Resistance Training   Training Prescription Yes    Weight 1 lb    Reps 10-15             Perform Capillary Blood Glucose checks as needed.  Exercise Prescription Changes:   Exercise Prescription Changes     Row Name 02/27/23 1032 03/13/23 1027           Response to Exercise   Blood Pressure (Admit) 108/68 130/54      Blood Pressure (Exercise) 142/72 --      Blood Pressure (Exit) 112/64 104/68      Heart Rate (Admit) 67 bpm 77 bpm      Heart Rate (Exercise) 85 bpm 91 bpm      Heart Rate (Exit) 71 bpm 82 bpm      Rating of Perceived Exertion (Exercise) 10 13      Symptoms None None      Comments Off to a fair start with exercise. --      Duration Progress to 30 minutes of  aerobic without signs/symptoms of physical distress Progress to 30 minutes of  aerobic without signs/symptoms of physical distress      Intensity THRR unchanged THRR unchanged        Progression   Progression Continue to progress workloads to maintain intensity without signs/symptoms of physical distress. Continue to progress workloads to maintain intensity without signs/symptoms of physical distress.      Average METs 1.5 1.6        Resistance Training   Training Prescription Yes Yes      Weight 1 lb 1 lb      Reps 10-15 10-15      Time 10 Minutes 10 Minutes        Interval Training   Interval Training No No        Recumbant  Bike   Level -- 1      Watts -- 10      Minutes -- 15      METs -- 1.7        NuStep   Level 2 2      SPM 60 80      Minutes 15 15      METs 1.5 1.5               Exercise Comments:   Exercise Comments     Row Name 02/27/23 1131           Exercise Comments Sharetha tolerated first session of exercise fair without symptoms. She completed one station then had to use the restroom. She completed the warm-up and cool-down stretches with seated modification as needed.                Exercise Goals and Review:   Exercise Goals     Row Name 02/21/23 1326             Exercise Goals   Increase Physical Activity Yes  Intervention Provide advice, education, support and counseling about physical activity/exercise needs.;Develop an individualized exercise prescription for aerobic and resistive training based on initial evaluation findings, risk stratification, comorbidities and participant's personal goals.       Expected Outcomes Short Term: Attend rehab on a regular basis to increase amount of physical activity.;Long Term: Add in home exercise to make exercise part of routine and to increase amount of physical activity.;Long Term: Exercising regularly at least 3-5 days a week.       Increase Strength and Stamina Yes       Intervention Provide advice, education, support and counseling about physical activity/exercise needs.;Develop an individualized exercise prescription for aerobic and resistive training based on initial evaluation findings, risk stratification, comorbidities and participant's personal goals.       Expected Outcomes Short Term: Increase workloads from initial exercise prescription for resistance, speed, and METs.;Short Term: Perform resistance training exercises routinely during rehab and add in resistance training at home;Long Term: Improve cardiorespiratory fitness, muscular endurance and strength as measured by increased METs and functional capacity ( )        Able to understand and use rate of perceived exertion (RPE) scale Yes       Intervention Provide education and explanation on how to use RPE scale       Expected Outcomes Short Term: Able to use RPE daily in rehab to express subjective intensity level;Long Term:  Able to use RPE to guide intensity level when exercising independently       Knowledge and understanding of Target Heart Rate Range (THRR) Yes       Intervention Provide education and explanation of THRR including how the numbers were predicted and where they are located for reference       Expected Outcomes Short Term: Able to state/look up THRR;Long Term: Able to use THRR to govern intensity when exercising independently;Short Term: Able to use daily as guideline for intensity in rehab       Understanding of Exercise Prescription Yes       Intervention Provide education, explanation, and written materials on patient's individual exercise prescription       Expected Outcomes Short Term: Able to explain program exercise prescription;Long Term: Able to explain home exercise prescription to exercise independently                Exercise Goals Re-Evaluation :  Exercise Goals Re-Evaluation     Row Name 02/27/23 1538             Exercise Goal Re-Evaluation   Exercise Goals Review Increase Physical Activity;Increase Strength and Stamina;Able to understand and use rate of perceived exertion (RPE) scale       Comments Raguel was able to understand and use RPE scale appropriately. She completed one station then had to use the restroom.       Expected Outcomes Progress duration as tolerated to achieve 30 minutes of aerobic exericse in addition to the warm-up and cool-down.                Discharge Exercise Prescription (Final Exercise Prescription Changes):  Exercise Prescription Changes - 03/13/23 1027       Response to Exercise   Blood Pressure (Admit) 130/54    Blood Pressure (Exit) 104/68    Heart Rate (Admit) 77  bpm    Heart Rate (Exercise) 91 bpm    Heart Rate (Exit) 82 bpm    Rating of Perceived Exertion (Exercise) 13    Symptoms None  Duration Progress to 30 minutes of  aerobic without signs/symptoms of physical distress    Intensity THRR unchanged      Progression   Progression Continue to progress workloads to maintain intensity without signs/symptoms of physical distress.    Average METs 1.6      Resistance Training   Training Prescription Yes    Weight 1 lb    Reps 10-15    Time 10 Minutes      Interval Training   Interval Training No      Recumbant Bike   Level 1    Watts 10    Minutes 15    METs 1.7      NuStep   Level 2    SPM 80    Minutes 15    METs 1.5             Nutrition:  Target Goals: Understanding of nutrition guidelines, daily intake of sodium 1500mg , cholesterol 200mg , calories 30% from fat and 7% or less from saturated fats, daily to have 5 or more servings of fruits and vegetables.  Biometrics:  Pre Biometrics - 02/21/23 1313       Pre Biometrics   Waist Circumference 48 inches    Hip Circumference 51 inches    Waist to Hip Ratio 0.94 %    Triceps Skinfold 29 mm    % Body Fat 50.2 %    Grip Strength 12 kg    Flexibility --   Not performed. Bilateral knee replacement.   Single Leg Stand --   Not performed, balance concerns.             Nutrition Therapy Plan and Nutrition Goals:   Nutrition Assessments:  MEDIFICTS Score Key: >=70 Need to make dietary changes  40-70 Heart Healthy Diet <= 40 Therapeutic Level Cholesterol Diet    Picture Your Plate Scores: <19 Unhealthy dietary pattern with much room for improvement. 41-50 Dietary pattern unlikely to meet recommendations for good health and room for improvement. 51-60 More healthful dietary pattern, with some room for improvement.  >60 Healthy dietary pattern, although there may be some specific behaviors that could be improved.    Nutrition Goals  Re-Evaluation:   Nutrition Goals Re-Evaluation:   Nutrition Goals Discharge (Final Nutrition Goals Re-Evaluation):   Psychosocial: Target Goals: Acknowledge presence or absence of significant depression and/or stress, maximize coping skills, provide positive support system. Participant is able to verbalize types and ability to use techniques and skills needed for reducing stress and depression.  Initial Review & Psychosocial Screening:  Initial Psych Review & Screening - 02/21/23 1437       Family Dynamics   Good Support System? Yes   Olanna lives alone. Edina has her 3 sons and friends who live in the area for support     Barriers   Psychosocial barriers to participate in program There are no identifiable barriers or psychosocial needs.      Screening Interventions   Interventions Encouraged to exercise             Quality of Life Scores:  Quality of Life - 02/21/23 1344       Quality of Life   Select Quality of Life      Quality of Life Scores   Health/Function Pre 21.54 %    Socioeconomic Pre 25.83 %    Psych/Spiritual Pre 23.57 %    Family Pre 28.5 %    GLOBAL Pre 23.73 %  Scores of 19 and below usually indicate a poorer quality of life in these areas.  A difference of  2-3 points is a clinically meaningful difference.  A difference of 2-3 points in the total score of the Quality of Life Index has been associated with significant improvement in overall quality of life, self-image, physical symptoms, and general health in studies assessing change in quality of life.  PHQ-9: Review Flowsheet       02/21/2023  Depression screen PHQ 2/9  Decreased Interest 0  Down, Depressed, Hopeless 0  PHQ - 2 Score 0  Altered sleeping 0  Tired, decreased energy 0  Change in appetite 0  Feeling bad or failure about yourself  0  Trouble concentrating 0  Moving slowly or fidgety/restless 0  Suicidal thoughts 0  PHQ-9 Score 0  Difficult doing work/chores Not  difficult at all   Interpretation of Total Score  Total Score Depression Severity:  1-4 = Minimal depression, 5-9 = Mild depression, 10-14 = Moderate depression, 15-19 = Moderately severe depression, 20-27 = Severe depression   Psychosocial Evaluation and Intervention:   Psychosocial Re-Evaluation:  Psychosocial Re-Evaluation     Row Name 02/28/23 1503 03/09/23 1424           Psychosocial Re-Evaluation   Current issues with None Identified None Identified      Comments Shandora did not voice any concerns or stressors on her first day of exercise Toniqua has  not voiced any increased  concerns or stressors during  exercise at cardiac rehab.      Interventions Encouraged to attend Cardiac Rehabilitation for the exercise Encouraged to attend Cardiac Rehabilitation for the exercise      Continue Psychosocial Services  No Follow up required No Follow up required               Psychosocial Discharge (Final Psychosocial Re-Evaluation):  Psychosocial Re-Evaluation - 03/09/23 1424       Psychosocial Re-Evaluation   Current issues with None Identified    Comments Kiki has  not voiced any increased  concerns or stressors during  exercise at cardiac rehab.    Interventions Encouraged to attend Cardiac Rehabilitation for the exercise    Continue Psychosocial Services  No Follow up required             Vocational Rehabilitation: Provide vocational rehab assistance to qualifying candidates.   Vocational Rehab Evaluation & Intervention:  Vocational Rehab - 02/21/23 1438       Initial Vocational Rehab Evaluation & Intervention   Assessment shows need for Vocational Rehabilitation No   Rehmat is retired and does not need vocational rehab at this time            Education: Education Goals: Education classes will be provided on a weekly basis, covering required topics. Participant will state understanding/return demonstration of topics presented.    Education     Row Name  02/27/23 1300     Education   Cardiac Education Topics Pritikin   Geographical information systems officer Psychosocial   Psychosocial Workshop Healthy Sleep for a Healthy Heart   Instruction Review Code 1- Verbalizes Understanding   Class Start Time 1155   Class Stop Time 1245   Class Time Calculation (min) 50 min    Row Name 03/03/23 1200     Education   Cardiac Education Topics Pritikin   Psychologist, sport and exercise     Core Videos  Educator Exercise Physiologist   Select Psychosocial   Psychosocial How Our Thoughts Can Heal Our Hearts   Instruction Review Code 1- Verbalizes Understanding   Class Start Time 1152   Class Stop Time 1226   Class Time Calculation (min) 34 min    Row Name 03/06/23 1300     Education   Cardiac Education Topics Pritikin   Select Workshops     Workshops   Educator Exercise Physiologist   Select Exercise   Exercise Workshop Managing Heart Disease: Your Path to a Healthier Heart   Instruction Review Code 1- Verbalizes Understanding   Class Start Time 1142   Class Stop Time 1238   Class Time Calculation (min) 56 min    Row Name 03/08/23 1500     Education   Cardiac Education Topics Pritikin   Customer service manager   Weekly Topic Simple Sides and Sauces   Instruction Review Code 1- Verbalizes Understanding   Class Start Time 1145   Class Stop Time 1226   Class Time Calculation (min) 41 min    Row Name 03/10/23 1200     Education   Cardiac Education Topics Pritikin   Hospital doctor Education   General Education Hypertension and Heart Disease   Instruction Review Code 1- Verbalizes Understanding   Class Start Time 1148   Class Stop Time 1225   Class Time Calculation (min) 37 min    Row Name 03/13/23 1100     Education   Cardiac Education Topics Pritikin   Building surveyor Psychosocial   Psychosocial Workshop From Head to Heart: The Power of a Healthy Outlook   Instruction Review Code 1- Verbalizes Understanding   Class Start Time 1148   Class Stop Time 1235   Class Time Calculation (min) 47 min            Core Videos: Exercise    Move It!  Clinical staff conducted group or individual video education with verbal and written material and guidebook.  Patient learns the recommended Pritikin exercise program. Exercise with the goal of living a long, healthy life. Some of the health benefits of exercise include controlled diabetes, healthier blood pressure levels, improved cholesterol levels, improved heart and lung capacity, improved sleep, and better body composition. Everyone should speak with their doctor before starting or changing an exercise routine.  Biomechanical Limitations Clinical staff conducted group or individual video education with verbal and written material and guidebook.  Patient learns how biomechanical limitations can impact exercise and how we can mitigate and possibly overcome limitations to have an impactful and balanced exercise routine.  Body Composition Clinical staff conducted group or individual video education with verbal and written material and guidebook.  Patient learns that body composition (ratio of muscle mass to fat mass) is a key component to assessing overall fitness, rather than body weight alone. Increased fat mass, especially visceral belly fat, can put [place] at increased risk for metabolic syndrome, type 2 diabetes, heart disease, and even death. It is recommended to combine diet and exercise (cardiovascular and resistance training) to improve your body composition. Seek guidance from your physician and exercise physiologist before implementing an exercise routine.  Exercise Action Plan Clinical staff conducted group or individual video education with verbal and  written material and  guidebook.  Patient learns the recommended strategies to achieve and enjoy long-term exercise adherence, including variety, self-motivation, self-efficacy, and positive decision making. Benefits of exercise include fitness, good health, weight management, more energy, better sleep, less stress, and overall well-being.  Medical   Heart Disease Risk Reduction Clinical staff conducted group or individual video education with verbal and written material and guidebook.  Patient learns our heart is our most vital organ as it circulates oxygen, nutrients, white blood cells, and hormones throughout the entire body, and carries waste away. Data supports a plant-based eating plan like the Pritikin Program for its effectiveness in slowing progression of and reversing heart disease. The video provides a number of recommendations to address heart disease.   Metabolic Syndrome and Belly Fat  Clinical staff conducted group or individual video education with verbal and written material and guidebook.  Patient learns what metabolic syndrome is, how it leads to heart disease, and how one can reverse it and keep it from coming back. You have metabolic syndrome if you have 3 of the following 5 criteria: abdominal obesity, high blood pressure, high triglycerides, low HDL cholesterol, and high blood sugar.  Hypertension and Heart Disease Clinical staff conducted group or individual video education with verbal and written material and guidebook.  Patient learns that high blood pressure, or hypertension, is very common in the [place]. Hypertension is largely due to excessive salt intake, but other important risk factors include being overweight, physical inactivity, drinking too much alcohol, smoking, and not eating enough potassium from fruits and vegetables. High blood pressure is a leading risk factor for heart attack, stroke, congestive heart failure, dementia, kidney failure, and premature  death. Long-term effects of excessive salt intake include stiffening of the arteries and thickening of heart muscle and organ damage. Recommendations include ways to reduce hypertension and the risk of heart disease.  Diseases of Our Time - Focusing on Diabetes Clinical staff conducted group or individual video education with verbal and written material and guidebook.  Patient learns why the best way to stop diseases of our time is prevention, through food and other lifestyle changes. Medicine (such as prescription pills and surgeries) is often only a Band-Aid on the problem, not a long-term solution. Most common diseases of our time include obesity, type 2 diabetes, hypertension, heart disease, and cancer. The Pritikin Program is recommended and has been proven to help reduce, reverse, and/or prevent the damaging effects of metabolic syndrome.  Nutrition   Overview of the Pritikin Eating Plan  Clinical staff conducted group or individual video education with verbal and written material and guidebook.  Patient learns about the Pritikin Eating Plan for disease risk reduction. The Pritikin Eating Plan emphasizes a wide variety of unrefined, minimally-processed carbohydrates, like fruits, vegetables, whole grains, and legumes. Go, Caution, and Stop food choices are explained. Plant-based and lean animal proteins are emphasized. Rationale provided for low sodium intake for blood pressure control, low added sugars for blood sugar stabilization, and low added fats and oils for coronary artery disease risk reduction and weight management.  Calorie Density  Clinical staff conducted group or individual video education with verbal and written material and guidebook.  Patient learns about calorie density and how it impacts the Pritikin Eating Plan. Knowing the characteristics of the food you choose will help you decide whether those foods will lead to weight gain or weight loss, and whether you want to consume  more or less of them. Weight loss is usually a side effect of  the Pritikin Eating Plan because of its focus on low calorie-dense foods.  Label Reading  Clinical staff conducted group or individual video education with verbal and written material and guidebook.  Patient learns about the Pritikin recommended label reading guidelines and corresponding recommendations regarding calorie density, added sugars, sodium content, and whole grains.  Dining Out - Part 1  Clinical staff conducted group or individual video education with verbal and written material and guidebook.  Patient learns that restaurant meals can be sabotaging because they can be so high in calories, fat, sodium, and/or sugar. Patient learns recommended strategies on how to positively address this and avoid unhealthy pitfalls.  Facts on Fats  Clinical staff conducted group or individual video education with verbal and written material and guidebook.  Patient learns that lifestyle modifications can be just as effective, if not more so, as many medications for lowering your risk of heart disease. A Pritikin lifestyle can help to reduce your risk of inflammation and atherosclerosis (cholesterol build-up, or plaque, in the artery walls). Lifestyle interventions such as dietary choices and physical activity address the cause of atherosclerosis. A review of the types of fats and their impact on blood cholesterol levels, along with dietary recommendations to reduce fat intake is also included.  Nutrition Action Plan  Clinical staff conducted group or individual video education with verbal and written material and guidebook.  Patient learns how to incorporate Pritikin recommendations into their lifestyle. Recommendations include planning and keeping personal health goals in mind as an important part of their success.  Healthy Mind-Set    Healthy Minds, Bodies, Hearts  Clinical staff conducted group or individual video education with verbal and  written material and guidebook.  Patient learns how to identify when they are stressed. Video will discuss the impact of that stress, as well as the many benefits of stress management. Patient will also be introduced to stress management techniques. The way we think, act, and feel has an impact on our hearts.  How Our Thoughts Can Heal Our Hearts  Clinical staff conducted group or individual video education with verbal and written material and guidebook.  Patient learns that negative thoughts can cause depression and anxiety. This can result in negative lifestyle behavior and serious health problems. Cognitive behavioral therapy is an effective method to help control our thoughts in order to change and improve our emotional outlook.  Additional Videos:  Exercise    Improving Performance  Clinical staff conducted group or individual video education with verbal and written material and guidebook.  Patient learns to use a non-linear approach by alternating intensity levels and lengths of time spent exercising to help burn more calories and lose more body fat. Cardiovascular exercise helps improve heart health, metabolism, hormonal balance, blood sugar control, and recovery from fatigue. Resistance training improves strength, endurance, balance, coordination, reaction time, metabolism, and muscle mass. Flexibility exercise improves circulation, posture, and balance. Seek guidance from your physician and exercise physiologist before implementing an exercise routine and learn your capabilities and proper form for all exercise.  Introduction to Yoga  Clinical staff conducted group or individual video education with verbal and written material and guidebook.  Patient learns about yoga, a discipline of the coming together of mind, breath, and body. The benefits of yoga include improved flexibility, improved range of motion, better posture and core strength, increased lung function, weight loss, and positive  self-image. Yoga's heart health benefits include lowered blood pressure, healthier heart rate, decreased cholesterol and triglyceride levels, improved immune function,  and reduced stress. Seek guidance from your physician and exercise physiologist before implementing an exercise routine and learn your capabilities and proper form for all exercise.  Medical   Aging: Enhancing Your Quality of Life  Clinical staff conducted group or individual video education with verbal and written material and guidebook.  Patient learns key strategies and recommendations to stay in good physical health and enhance quality of life, such as prevention strategies, having an advocate, securing a Health Care Proxy and Power of Attorney, and keeping a list of medications and system for tracking them. It also discusses how to avoid risk for bone loss.  Biology of Weight Control  Clinical staff conducted group or individual video education with verbal and written material and guidebook.  Patient learns that weight gain occurs because we consume more calories than we burn (eating more, moving less). Even if your body weight is normal, you may have higher ratios of fat compared to muscle mass. Too much body fat puts you at increased risk for cardiovascular disease, heart attack, stroke, type 2 diabetes, and obesity-related cancers. In addition to exercise, following the Pritikin Eating Plan can help reduce your risk.  Decoding Lab Results  Clinical staff conducted group or individual video education with verbal and written material and guidebook.  Patient learns that lab test reflects one measurement whose values change over time and are influenced by many factors, including medication, stress, sleep, exercise, food, hydration, pre-existing medical conditions, and more. It is recommended to use the knowledge from this video to become more involved with your lab results and evaluate your numbers to speak with your  doctor.   Diseases of Our Time - Overview  Clinical staff conducted group or individual video education with verbal and written material and guidebook.  Patient learns that according to the CDC, 50% to 70% of chronic diseases (such as obesity, type 2 diabetes, elevated lipids, hypertension, and heart disease) are avoidable through lifestyle improvements including healthier food choices, listening to satiety cues, and increased physical activity.  Sleep Disorders Clinical staff conducted group or individual video education with verbal and written material and guidebook.  Patient learns how good quality and duration of sleep are important to overall health and well-being. Patient also learns about sleep disorders and how they impact health along with recommendations to address them, including discussing with a physician.  Nutrition  Dining Out - Part 2 Clinical staff conducted group or individual video education with verbal and written material and guidebook.  Patient learns how to plan ahead and communicate in order to maximize their dining experience in a healthy and nutritious manner. Included are recommended food choices based on the type of restaurant the patient is visiting.   Fueling a Banker conducted group or individual video education with verbal and written material and guidebook.  There is a strong connection between our food choices and our health. Diseases like obesity and type 2 diabetes are very prevalent and are in large-part due to lifestyle choices. The Pritikin Eating Plan provides plenty of food and hunger-curbing satisfaction. It is easy to follow, affordable, and helps reduce health risks.  Menu Workshop  Clinical staff conducted group or individual video education with verbal and written material and guidebook.  Patient learns that restaurant meals can sabotage health goals because they are often packed with calories, fat, sodium, and sugar.  Recommendations include strategies to plan ahead and to communicate with the manager, chef, or server to help order a healthier  meal.  Planning Your Eating Strategy  Clinical staff conducted group or individual video education with verbal and written material and guidebook.  Patient learns about the Pritikin Eating Plan and its benefit of reducing the risk of disease. The Pritikin Eating Plan does not focus on calories. Instead, it emphasizes high-quality, nutrient-rich foods. By knowing the characteristics of the foods, we choose, we can determine their calorie density and make informed decisions.  Targeting Your Nutrition Priorities  Clinical staff conducted group or individual video education with verbal and written material and guidebook.  Patient learns that lifestyle habits have a tremendous impact on disease risk and progression. This video provides eating and physical activity recommendations based on your personal health goals, such as reducing LDL cholesterol, losing weight, preventing or controlling type 2 diabetes, and reducing high blood pressure.  Vitamins and Minerals  Clinical staff conducted group or individual video education with verbal and written material and guidebook.  Patient learns different ways to obtain key vitamins and minerals, including through a recommended healthy diet. It is important to discuss all supplements you take with your doctor.   Healthy Mind-Set    Smoking Cessation  Clinical staff conducted group or individual video education with verbal and written material and guidebook.  Patient learns that cigarette smoking and tobacco addiction pose a serious health risk which affects millions of people. Stopping smoking will significantly reduce the risk of heart disease, lung disease, and many forms of cancer. Recommended strategies for quitting are covered, including working with your doctor to develop a successful plan.  Culinary   Becoming a Corporate investment banker conducted group or individual video education with verbal and written material and guidebook.  Patient learns that cooking at home can be healthy, cost-effective, quick, and puts them in control. Keys to cooking healthy recipes will include looking at your recipe, assessing your equipment needs, planning ahead, making it simple, choosing cost-effective seasonal ingredients, and limiting the use of added fats, salts, and sugars.  Cooking - Breakfast and Snacks  Clinical staff conducted group or individual video education with verbal and written material and guidebook.  Patient learns how important breakfast is to satiety and nutrition through the entire day. Recommendations include key foods to eat during breakfast to help stabilize blood sugar levels and to prevent overeating at meals later in the day. Planning ahead is also a key component.  Cooking - Educational psychologist conducted group or individual video education with verbal and written material and guidebook.  Patient learns eating strategies to improve overall health, including an approach to cook more at home. Recommendations include thinking of animal protein as a side on your plate rather than center stage and focusing instead on lower calorie dense options like vegetables, fruits, whole grains, and plant-based proteins, such as beans. Making sauces in large quantities to freeze for later and leaving the skin on your vegetables are also recommended to maximize your experience.  Cooking - Healthy Salads and Dressing Clinical staff conducted group or individual video education with verbal and written material and guidebook.  Patient learns that vegetables, fruits, whole grains, and legumes are the foundations of the Pritikin Eating Plan. Recommendations include how to incorporate each of these in flavorful and healthy salads, and how to create homemade salad dressings. Proper handling of ingredients is also covered.  Cooking - Soups and [employer] - Soups and Desserts Clinical staff conducted group or individual video education with verbal and written material  and guidebook.  Patient learns that Pritikin soups and desserts make for easy, nutritious, and delicious snacks and meal components that are low in sodium, fat, sugar, and calorie density, while high in vitamins, minerals, and filling fiber. Recommendations include simple and healthy ideas for soups and desserts.   Overview     The Pritikin Solution Program Overview Clinical staff conducted group or individual video education with verbal and written material and guidebook.  Patient learns that the results of the Pritikin Program have been documented in more than 100 articles published in peer-reviewed journals, and the benefits include reducing risk factors for (and, in some cases, even reversing) high cholesterol, high blood pressure, type 2 diabetes, obesity, and more! An overview of the three key pillars of the Pritikin Program will be covered: eating well, doing regular exercise, and having a healthy mind-set.  WORKSHOPS  Exercise: Exercise Basics: Building Your Action Plan Clinical staff led group instruction and group discussion with PowerPoint presentation and patient guidebook. To enhance the learning environment the use of posters, models and videos may be added. At the conclusion of this workshop, patients will comprehend the difference between physical activity and exercise, as well as the benefits of incorporating both, into their routine. Patients will understand the FITT (Frequency, Intensity, Time, and Type) principle and how to use it to build an exercise action plan. In addition, safety concerns and other considerations for exercise and cardiac rehab will be addressed by the presenter. The purpose of this lesson is to promote a comprehensive and effective weekly exercise routine in order to improve patients' overall level of  fitness.   Managing Heart Disease: Your Path to a Healthier Heart Clinical staff led group instruction and group discussion with PowerPoint presentation and patient guidebook. To enhance the learning environment the use of posters, models and videos may be added.At the conclusion of this workshop, patients will understand the anatomy and physiology of the heart. Additionally, they will understand how Pritikin's three pillars impact the risk factors, the progression, and the management of heart disease.  The purpose of this lesson is to provide a high-level overview of the heart, heart disease, and how the Pritikin lifestyle positively impacts risk factors.  Exercise Biomechanics Clinical staff led group instruction and group discussion with PowerPoint presentation and patient guidebook. To enhance the learning environment the use of posters, models and videos may be added. Patients will learn how the structural parts of their bodies function and how these functions impact their daily activities, movement, and exercise. Patients will learn how to promote a neutral spine, learn how to manage pain, and identify ways to improve their physical movement in order to promote healthy living. The purpose of this lesson is to expose patients to common physical limitations that impact physical activity. Participants will learn practical ways to adapt and manage aches and pains, and to minimize their effect on regular exercise. Patients will learn how to maintain good posture while sitting, walking, and lifting.  Balance Training and Fall Prevention  Clinical staff led group instruction and group discussion with PowerPoint presentation and patient guidebook. To enhance the learning environment the use of posters, models and videos may be added. At the conclusion of this workshop, patients will understand the importance of their sensorimotor skills (vision, proprioception, and the vestibular system)  in maintaining their ability to balance as they age. Patients will apply a variety of balancing exercises that are appropriate for their current level of function. Patients will understand the common causes  for poor balance, possible solutions to these problems, and ways to modify their physical environment in order to minimize their fall risk. The purpose of this lesson is to teach patients about the importance of maintaining balance as they age and ways to minimize their risk of falling.  WORKSHOPS   Nutrition:  Fueling a Ship broker led group instruction and group discussion with PowerPoint presentation and patient guidebook. To enhance the learning environment the use of posters, models and videos may be added. Patients will review the foundational principles of the Pritikin Eating Plan and understand what constitutes a serving size in each of the food groups. Patients will also learn Pritikin-friendly foods that are better choices when away from home and review make-ahead meal and snack options. Calorie density will be reviewed and applied to three nutrition priorities: weight maintenance, weight loss, and weight gain. The purpose of this lesson is to reinforce (in a group setting) the key concepts around what patients are recommended to eat and how to apply these guidelines when away from home by planning and selecting Pritikin-friendly options. Patients will understand how calorie density may be adjusted for different weight management goals.  Mindful Eating  Clinical staff led group instruction and group discussion with PowerPoint presentation and patient guidebook. To enhance the learning environment the use of posters, models and videos may be added. Patients will briefly review the concepts of the Pritikin Eating Plan and the importance of low-calorie dense foods. The concept of mindful eating will be introduced as well as the importance of paying attention to internal hunger  signals. Triggers for non-hunger eating and techniques for dealing with triggers will be explored. The purpose of this lesson is to provide patients with the opportunity to review the basic principles of the Pritikin Eating Plan, discuss the value of eating mindfully and how to measure internal cues of hunger and fullness using the Hunger Scale. Patients will also discuss reasons for non-hunger eating and learn strategies to use for controlling emotional eating.  Targeting Your Nutrition Priorities Clinical staff led group instruction and group discussion with PowerPoint presentation and patient guidebook. To enhance the learning environment the use of posters, models and videos may be added. Patients will learn how to determine their genetic susceptibility to disease by reviewing their family history. Patients will gain insight into the importance of diet as part of an overall healthy lifestyle in mitigating the impact of genetics and other environmental insults. The purpose of this lesson is to provide patients with the opportunity to assess their personal nutrition priorities by looking at their family history, their own health history and current risk factors. Patients will also be able to discuss ways of prioritizing and modifying the Pritikin Eating Plan for their highest risk areas  Menu  Clinical staff led group instruction and group discussion with PowerPoint presentation and patient guidebook. To enhance the learning environment the use of posters, models and videos may be added. Using menus brought in from [employer], or printed from [employer], patients will apply the Pritikin dining out guidelines that were presented in the [employer] video. Patients will also be able to practice these guidelines in a variety of provided scenarios. The purpose of this lesson is to provide patients with the opportunity to practice hands-on learning of the Pritikin Dining Out guidelines  with actual menus and practice scenarios.  Label Reading Clinical staff led group instruction and group discussion with PowerPoint presentation and patient guidebook. To enhance  the learning environment the use of posters, models and videos may be added. Patients will review and discuss the Pritikin label reading guidelines presented in Pritikin's Label Reading Educational series video. Using fool labels brought in from local grocery stores and markets, patients will apply the label reading guidelines and determine if the packaged food meet the Pritikin guidelines. The purpose of this lesson is to provide patients with the opportunity to review, discuss, and practice hands-on learning of the Pritikin Label Reading guidelines with actual packaged food labels. Cooking School  Pritikin's [employer] are designed to teach patients ways to prepare quick, simple, and affordable recipes at home. The importance of nutrition's role in chronic disease risk reduction is reflected in its emphasis in the overall Pritikin program. By learning how to prepare essential core Pritikin Eating Plan recipes, patients will increase control over what they eat; be able to customize the flavor of foods without the use of added salt, sugar, or fat; and improve the quality of the food they consume. By learning a set of core recipes which are easily assembled, quickly prepared, and affordable, patients are more likely to prepare more healthy foods at home. These workshops focus on convenient breakfasts, simple entres, side dishes, and desserts which can be prepared with minimal effort and are consistent with nutrition recommendations for cardiovascular risk reduction. Cooking [employer] are taught by a Armed forces logistics/support/administrative officer (RD) who has been trained by the [employer]. The chef or RD has a clear understanding of the importance of minimizing - if not completely eliminating - added fat, sugar, and  sodium in recipes. Throughout the series of Cooking School Workshop sessions, patients will learn about healthy ingredients and efficient methods of cooking to build confidence in their capability to prepare    Cooking School weekly topics:  Adding Flavor- Sodium-Free  Fast and Healthy Breakfasts  Powerhouse Plant-Based Proteins  Satisfying Salads and Dressings  Simple Sides and Sauces  International Cuisine-Spotlight on the [employer] Zones  Delicious Desserts  Savory Soups  [employer] - Meals in a Astronomer Appetizers and Snacks  Comforting Weekend Breakfasts  One-Pot Wonders   Fast Evening Meals  Landscape architect Your Pritikin Plate  WORKSHOPS   Healthy Mindset (Psychosocial):  Focused Goals, Sustainable Changes Clinical staff led group instruction and group discussion with PowerPoint presentation and patient guidebook. To enhance the learning environment the use of posters, models and videos may be added. Patients will be able to apply effective goal setting strategies to establish at least one personal goal, and then take consistent, meaningful action toward that goal. They will learn to identify common barriers to achieving personal goals and develop strategies to overcome them. Patients will also gain an understanding of how our mind-set can impact our ability to achieve goals and the importance of cultivating a positive and growth-oriented mind-set. The purpose of this lesson is to provide patients with a deeper understanding of how to set and achieve personal goals, as well as the tools and strategies needed to overcome common obstacles which may arise along the way.  From Head to Heart: The Power of a Healthy Outlook  Clinical staff led group instruction and group discussion with PowerPoint presentation and patient guidebook. To enhance the learning environment the use of posters, models and videos may be added. Patients will be able to recognize and  describe the impact of emotions and mood on physical health. They will discover the importance of self-care and  explore self-care practices which may work for them. Patients will also learn how to utilize the 4 C's to cultivate a healthier outlook and better manage stress and challenges. The purpose of this lesson is to demonstrate to patients how a healthy outlook is an essential part of maintaining good health, especially as they continue their cardiac rehab journey.  Healthy Sleep for a Healthy Heart Clinical staff led group instruction and group discussion with PowerPoint presentation and patient guidebook. To enhance the learning environment the use of posters, models and videos may be added. At the conclusion of this workshop, patients will be able to demonstrate knowledge of the importance of sleep to overall health, well-being, and quality of life. They will understand the symptoms of, and treatments for, common sleep disorders. Patients will also be able to identify daytime and nighttime behaviors which impact sleep, and they will be able to apply these tools to help manage sleep-related challenges. The purpose of this lesson is to provide patients with a general overview of sleep and outline the importance of quality sleep. Patients will learn about a few of the most common sleep disorders. Patients will also be introduced to the concept of "sleep hygiene," and discover ways to self-manage certain sleeping problems through simple daily behavior changes. Finally, the workshop will motivate patients by clarifying the links between quality sleep and their goals of heart-healthy living.   Recognizing and Reducing Stress Clinical staff led group instruction and group discussion with PowerPoint presentation and patient guidebook. To enhance the learning environment the use of posters, models and videos may be added. At the conclusion of this workshop, patients will be able to understand the types of stress  reactions, differentiate between acute and chronic stress, and recognize the impact that chronic stress has on their health. They will also be able to apply different coping mechanisms, such as reframing negative self-talk. Patients will have the opportunity to practice a variety of stress management techniques, such as deep abdominal breathing, progressive muscle relaxation, and/or guided imagery.  The purpose of this lesson is to educate patients on the role of stress in their lives and to provide healthy techniques for coping with it.  Learning Barriers/Preferences:  Learning Barriers/Preferences - 02/21/23 1523       Learning Barriers/Preferences   Learning Barriers Sight;Exercise Concerns   wears reading glasses. Walks very slow due to multiple knee surgeries uses a rolling walker   Learning Preferences Verbal Instruction;Pictoral             Education Topics:  Knowledge Questionnaire Score:  Knowledge Questionnaire Score - 02/21/23 1344       Knowledge Questionnaire Score   Pre Score 24/24             Core Components/Risk Factors/Patient Goals at Admission:  Personal Goals and Risk Factors at Admission - 02/21/23 1344       Core Components/Risk Factors/Patient Goals on Admission    Weight Management Yes;Obesity;Weight Loss    Intervention Weight Management/Obesity: Establish reasonable short term and long term weight goals.;Obesity: Provide education and appropriate resources to help participant work on and attain dietary goals.    Expected Outcomes Short Term: Continue to assess and modify interventions until short term weight is achieved;Long Term: Adherence to nutrition and physical activity/exercise program aimed toward attainment of established weight goal;Weight Loss: Understanding of general recommendations for a balanced deficit meal plan, which promotes 1-2 lb weight loss per week and includes a negative energy balance of [phone] kcal/d  Hypertension Yes     Intervention Provide education on lifestyle modifcations including regular physical activity/exercise, weight management, moderate sodium restriction and increased consumption of fresh fruit, vegetables, and low fat dairy, alcohol moderation, and smoking cessation.;Monitor prescription use compliance.    Expected Outcomes Short Term: Continued assessment and intervention until BP is < 140/39mm HG in hypertensive participants. < 130/51mm HG in hypertensive participants with diabetes, heart failure or chronic kidney disease.;Long Term: Maintenance of blood pressure at goal levels.    Lipids Yes    Intervention Provide education and support for participant on nutrition & aerobic/resistive exercise along with prescribed medications to achieve LDL 70mg , HDL >40mg .    Expected Outcomes Short Term: Participant states understanding of desired cholesterol values and is compliant with medications prescribed. Participant is following exercise prescription and nutrition guidelines.;Long Term: Cholesterol controlled with medications as prescribed, with individualized exercise RX and with personalized nutrition plan. Value goals: LDL < 70mg , HDL > 40 mg.             Core Components/Risk Factors/Patient Goals Review:   Goals and Risk Factor Review     Row Name 02/28/23 1510 03/09/23 1425           Core Components/Risk Factors/Patient Goals Review   Personal Goals Review Weight Management/Obesity;Hypertension;Lipids Weight Management/Obesity;Hypertension;Lipids      Review Ltanya started cardiac rehab on 02/27/23. Mashawn did well with exercise for her fintness level as Janith is somewhat deconditioned. Vital signs were stable. Adeleine started cardiac rehab on 02/27/23. Meline is off to a good start to exercise for her fitness level.Ahmia is somewhat deconditioned. Vital signs have been stable.      Expected Outcomes Quanna will continue to participate in cardiac rehab for exercise, nutrition and lifestyle  modifications. Kelly will continue to participate in cardiac rehab for exercise, nutrition and lifestyle modifications.               Core Components/Risk Factors/Patient Goals at Discharge (Final Review):   Goals and Risk Factor Review - 03/09/23 1425       Core Components/Risk Factors/Patient Goals Review   Personal Goals Review Weight Management/Obesity;Hypertension;Lipids    Review Rasheen started cardiac rehab on 02/27/23. Megann is off to a good start to exercise for her fitness level.Jaymee is somewhat deconditioned. Vital signs have been stable.    Expected Outcomes Rula will continue to participate in cardiac rehab for exercise, nutrition and lifestyle modifications.             ITP Comments:  ITP Comments     Row Name 02/21/23 1313 02/28/23 1457 03/09/23 1423       ITP Comments Medical Director- Dr. Armanda Magic, MD. Introduction to the [facility] / Intensive Cardiac Rehab. Reviewed initial orientation folder. 30 day ITP Review. Myracle started cardiac rehab on 02/27/23. Conswella did well with exercise for her fitness level 30 day ITP Review. Shantell started cardiac rehab on 02/27/23. Jasiel is off to a good start to  exercise for her fitness level              Comments: See ITP Comments

## 2023-03-14 DIAGNOSIS — Z79899 Other long term (current) drug therapy: Secondary | ICD-10-CM | POA: Diagnosis not present

## 2023-03-14 DIAGNOSIS — M0579 Rheumatoid arthritis with rheumatoid factor of multiple sites without organ or systems involvement: Secondary | ICD-10-CM | POA: Diagnosis not present

## 2023-03-15 ENCOUNTER — Encounter (HOSPITAL_COMMUNITY)
Admission: RE | Admit: 2023-03-15 | Discharge: 2023-03-15 | Disposition: A | Discharge status: Home / Self Care | Payer: Medicare Other | Source: Ambulatory Visit | Attending: Cardiovascular Disease | Admitting: Cardiovascular Disease

## 2023-03-15 DIAGNOSIS — Z952 Presence of prosthetic heart valve: Secondary | ICD-10-CM | POA: Diagnosis not present

## 2023-03-17 ENCOUNTER — Encounter (HOSPITAL_COMMUNITY)
Admission: RE | Admit: 2023-03-17 | Discharge: 2023-03-17 | Disposition: A | Discharge status: Home / Self Care | Payer: Medicare Other | Source: Ambulatory Visit | Attending: Cardiovascular Disease

## 2023-03-17 DIAGNOSIS — Z952 Presence of prosthetic heart valve: Secondary | ICD-10-CM | POA: Diagnosis not present

## 2023-03-20 ENCOUNTER — Encounter (HOSPITAL_COMMUNITY)
Admission: RE | Admit: 2023-03-20 | Discharge: 2023-03-20 | Disposition: A | Discharge status: Home / Self Care | Payer: Medicare Other | Source: Ambulatory Visit | Attending: Cardiovascular Disease

## 2023-03-20 DIAGNOSIS — Z952 Presence of prosthetic heart valve: Secondary | ICD-10-CM

## 2023-03-22 ENCOUNTER — Encounter (HOSPITAL_COMMUNITY): Payer: Medicare Other

## 2023-03-22 ENCOUNTER — Telehealth (HOSPITAL_COMMUNITY): Payer: Self-pay | Admitting: *Deleted

## 2023-03-22 NOTE — Telephone Encounter (Signed)
Makaiya left message on department voicemail that she will be absent from cardiac rehab today. Plans to return on Friday.

## 2023-03-24 ENCOUNTER — Encounter (HOSPITAL_COMMUNITY)
Admission: RE | Admit: 2023-03-24 | Discharge: 2023-03-24 | Disposition: A | Discharge status: Home / Self Care | Payer: Medicare Other | Source: Ambulatory Visit | Attending: Cardiovascular Disease | Admitting: Cardiovascular Disease

## 2023-03-24 DIAGNOSIS — Z952 Presence of prosthetic heart valve: Secondary | ICD-10-CM | POA: Diagnosis not present

## 2023-03-27 ENCOUNTER — Encounter (HOSPITAL_COMMUNITY)
Admission: RE | Admit: 2023-03-27 | Discharge: 2023-03-27 | Disposition: A | Discharge status: Home / Self Care | Payer: Medicare Other | Source: Ambulatory Visit | Attending: Cardiovascular Disease | Admitting: Cardiovascular Disease

## 2023-03-27 DIAGNOSIS — Z952 Presence of prosthetic heart valve: Secondary | ICD-10-CM

## 2023-03-29 ENCOUNTER — Encounter (HOSPITAL_COMMUNITY): Payer: Medicare Other

## 2023-03-31 ENCOUNTER — Encounter (HOSPITAL_COMMUNITY)
Admission: RE | Admit: 2023-03-31 | Discharge: 2023-03-31 | Disposition: A | Discharge status: Home / Self Care | Payer: Medicare Other | Source: Ambulatory Visit | Attending: Cardiovascular Disease | Admitting: Cardiovascular Disease

## 2023-03-31 DIAGNOSIS — Z952 Presence of prosthetic heart valve: Secondary | ICD-10-CM

## 2023-04-03 ENCOUNTER — Encounter (HOSPITAL_COMMUNITY)
Admission: RE | Admit: 2023-04-03 | Discharge: 2023-04-03 | Disposition: A | Discharge status: Home / Self Care | Payer: Medicare Other | Source: Ambulatory Visit | Attending: Cardiovascular Disease | Admitting: Cardiovascular Disease

## 2023-04-03 DIAGNOSIS — Z952 Presence of prosthetic heart valve: Secondary | ICD-10-CM

## 2023-04-05 ENCOUNTER — Encounter (HOSPITAL_COMMUNITY)
Admission: RE | Admit: 2023-04-05 | Discharge: 2023-04-05 | Disposition: A | Discharge status: Home / Self Care | Payer: Medicare Other | Source: Ambulatory Visit | Attending: Cardiovascular Disease

## 2023-04-05 DIAGNOSIS — Z952 Presence of prosthetic heart valve: Secondary | ICD-10-CM | POA: Diagnosis not present

## 2023-04-07 ENCOUNTER — Encounter (HOSPITAL_COMMUNITY)
Admission: RE | Admit: 2023-04-07 | Discharge: 2023-04-07 | Disposition: A | Discharge status: Home / Self Care | Payer: Medicare Other | Source: Ambulatory Visit | Attending: Cardiovascular Disease | Admitting: Cardiovascular Disease

## 2023-04-07 DIAGNOSIS — Z952 Presence of prosthetic heart valve: Secondary | ICD-10-CM | POA: Diagnosis not present

## 2023-04-10 ENCOUNTER — Encounter (HOSPITAL_COMMUNITY)
Admission: RE | Admit: 2023-04-10 | Discharge: 2023-04-10 | Disposition: A | Discharge status: Home / Self Care | Payer: Medicare Other | Source: Ambulatory Visit | Attending: Cardiovascular Disease | Admitting: Cardiovascular Disease

## 2023-04-10 DIAGNOSIS — Z952 Presence of prosthetic heart valve: Secondary | ICD-10-CM | POA: Diagnosis not present

## 2023-04-10 DIAGNOSIS — Z48812 Encounter for surgical aftercare following surgery on the circulatory system: Secondary | ICD-10-CM | POA: Diagnosis not present

## 2023-04-11 DIAGNOSIS — M0579 Rheumatoid arthritis with rheumatoid factor of multiple sites without organ or systems involvement: Secondary | ICD-10-CM | POA: Diagnosis not present

## 2023-04-11 NOTE — Progress Notes (Signed)
 Cardiac Individual Treatment Plan  Patient Details  Name: [name] MRN: [id number] Date of Birth: 29-Jul-1935 Referring Provider:   Flowsheet Row INTENSIVE CARDIAC REHAB ORIENT from 02/21/2023 in [facility]  Referring Provider Tonny Bollman, MD       Initial Encounter Date:  Flowsheet Row INTENSIVE CARDIAC REHAB ORIENT from 02/21/2023 in [facility]  Date 02/21/23       Visit Diagnosis: 01/04/23 TAVR (transcatheter aortic valve replacement)  Patient's Home Medications on Admission:  Current Outpatient Medications:    acetaminophen (TYLENOL) 500 MG tablet, Take 500 mg by mouth every 6 (six) hours as needed for headache., Disp: , Rfl:    aspirin EC 81 MG tablet, Take 1 tablet (81 mg total) by mouth daily. Swallow whole., Disp: , Rfl:    Cholecalciferol (VITAMIN D3) 250 MCG (10000 UT) capsule, Take 10,000 Units by mouth every Monday, Wednesday, and Friday., Disp: , Rfl:    diphenhydrAMINE (BENADRYL ALLERGY) 25 MG tablet, Take 12.5 mg by mouth 2 (two) times daily., Disp: , Rfl:    diphenhydrAMINE (BENADRYL) 50 MG/ML injection, 50 mg Injection with infusions, Disp: , Rfl:    estrogens, conjugated, (PREMARIN) 0.3 MG tablet, Take 0.3 mg by mouth daily with breakfast. , Disp: , Rfl:    folic acid (FOLVITE) 1 MG tablet, Take 1 mg by mouth daily with breakfast., Disp: , Rfl:    furosemide (LASIX) 20 MG tablet, Take 1 tablet (20 mg total) by mouth daily., Disp: , Rfl:    methotrexate (RHEUMATREX) 2.5 MG tablet, Take 7.5 mg by mouth 2 (two) times a week. Caution:Chemotherapy. Protect from light. On Saturdays and Sundays, Disp: , Rfl:    Multiple Vitamins-Minerals (PRESERVISION AREDS 2 PO), Take 1 capsule by mouth 2 (two) times daily., Disp: , Rfl:    OVER THE COUNTER MEDICATION, Take 1 tablet by mouth daily. Beyond Osteo supplement, Disp: , Rfl:    Propylene Glycol  (SYSTANE BALANCE OP), Place 1 drop into both eyes 2 (two) times daily as needed (dry eyes)., Disp: , Rfl:    Psyllium (METAMUCIL 3 IN 1 DAILY FIBER PO), Take 1-2 capsules by mouth daily., Disp: , Rfl:    RESTASIS 0.05 % ophthalmic emulsion, Place 1 drop into both eyes 2 (two) times daily., Disp: , Rfl:    rosuvastatin (CRESTOR) 20 MG tablet, Take 20 mg by mouth daily., Disp: , Rfl:    saccharomyces boulardii (FLORASTOR) 250 MG capsule, Take 250 mg by mouth every other day., Disp: , Rfl:    tocilizumab (ACTEMRA) 400 MG/20ML SOLN injection, Inject into the vein every 30 (thirty) days., Disp: , Rfl:    traMADol (ULTRAM) 50 MG tablet, Take 1-2 tablets (50-100 mg total) by mouth every 6 (six) hours as needed. (Patient taking differently: Take 25 mg by mouth daily.), Disp: 80 tablet, Rfl: 1   vitamin C (ASCORBIC ACID) 500 MG tablet, Take 500 mg by mouth every other day., Disp: , Rfl:   Past Medical History: Past Medical History:  Diagnosis Date   GERD (gastroesophageal reflux disease)    Hyperlipidemia    Hyperthyroidism    Hypothyroidism    Macular degeneration    Morbid obesity (HCC)    Osteoporosis    PONV (postoperative nausea and vomiting)    none recently   S/P TAVR (transcatheter aortic valve replacement) 01/03/2023   s/p TAVR with a 26 mm Medtronic FX via the  TF approach by Dr. Excell Seltzer & [facility]   Severe aortic stenosis    Shingles 04/21/2014   seen by Dr Waynard Edwards and treated with Valtrex    Tobacco Use: Social History   Tobacco Use  Smoking Status Former   Current packs/day: 0.00   Average packs/day: 0.5 packs/day for 15.0 years (7.5 ttl pk-yrs)   Types: Cigarettes   Start date: 02/07/1990   Quit date: 02/07/2005   Years since quitting: 18.1  Smokeless Tobacco Never    Labs: Review Flowsheet       Latest Ref Rng & Units 09/15/2020 01/03/2023  Labs for ITP Cardiac and Pulmonary Rehab  Cholestrol 100 - 199 mg/dL 045  -  LDL (calc) 0 - 99 mg/dL 73  -  HDL-C >40 mg/dL 59  -   Trlycerides 0 - 149 mg/dL 981  -  TCO2 22 - 32 mmol/L - 28     Capillary Blood Glucose: Lab Results  Component Value Date   GLUCAP 101 (H) 01/04/2023   GLUCAP 129 (H) 01/03/2023   GLUCAP 87 01/03/2023   GLUCAP 89 12/26/2022     Exercise Target Goals: Exercise Program Goal: Individual exercise prescription set using results from initial 6 min walk test and THRR while considering  patient's activity barriers and safety.   Exercise Prescription Goal: Initial exercise prescription builds to 30-45 minutes a day of aerobic activity, 2-3 days per week.  Home exercise guidelines will be given to patient during program as part of exercise prescription that the participant will acknowledge.  Activity Barriers & Risk Stratification:  Activity Barriers & Cardiac Risk Stratification - 02/21/23 1426       Activity Barriers & Cardiac Risk Stratification   Activity Barriers Balance Concerns;Assistive Device;Left Knee Replacement;Right Knee Replacement;Other (comment);Arthritis    Comments Multiple hand surgeries, bilateral wrist bone removal.    Cardiac Risk Stratification High             6 Minute Walk:  6 Minute Walk     Row Name 02/21/23 1533         6 Minute Walk   Phase Initial     Distance 240 feet     Walk Time 6 minutes     # of Rest Breaks 0     MPH 0.45     METS 1.35     RPE 11     Perceived Dyspnea  0     Symptoms No     Resting HR 65 bpm     Resting BP 112/70     Resting Oxygen Saturation  94 %     Exercise Oxygen Saturation  during 6 min walk 93 %     Max Ex. HR 83 bpm     Max Ex. BP 160/82     2 Minute Post BP 158/74              Oxygen Initial Assessment:   Oxygen Re-Evaluation:   Oxygen Discharge (Final Oxygen Re-Evaluation):   Initial Exercise Prescription:  Initial Exercise Prescription - 02/21/23 1500       Date of Initial Exercise RX and Referring Provider   Date 02/21/23    Referring Provider Tonny Bollman, MD    Expected  Discharge Date 06/09/23      Recumbant Bike   Level 1    Watts 1    Minutes 15    METs 1.4      NuStep   Level 2    SPM 70  Minutes 15    METs 1.3      Prescription Details   Frequency (times per week) 3    Duration Progress to 30 minutes of continuous aerobic without signs/symptoms of physical distress      Intensity   THRR 40-80% of Max Heartrate 54-108    Ratings of Perceived Exertion 11-13    Perceived Dyspnea 0-4      Progression   Progression Continue to progress workloads to maintain intensity without signs/symptoms of physical distress.      Resistance Training   Training Prescription Yes    Weight 1 lb    Reps 10-15             Perform Capillary Blood Glucose checks as needed.  Exercise Prescription Changes:   Exercise Prescription Changes     Row Name 02/27/23 1032 03/13/23 1027 03/20/23 1024 04/10/23 1025       Response to Exercise   Blood Pressure (Admit) 108/68 130/54 122/70 122/68    Blood Pressure (Exercise) 142/72 -- 136/68 --    Blood Pressure (Exit) 112/64 104/68 122/70 112/70    Heart Rate (Admit) 67 bpm 77 bpm 85 bpm 82 bpm    Heart Rate (Exercise) 85 bpm 91 bpm 96 bpm 114 bpm    Heart Rate (Exit) 71 bpm 82 bpm 75 bpm 78 bpm    Rating of Perceived Exertion (Exercise) 10 13 11 8     Symptoms None None None None    Comments Off to a fair start with exercise. -- Reviewed home exercise guidelines and goals with Serafina. --    Duration Progress to 30 minutes of  aerobic without signs/symptoms of physical distress Progress to 30 minutes of  aerobic without signs/symptoms of physical distress Continue with 30 min of aerobic exercise without signs/symptoms of physical distress. Progress to 30 minutes of  aerobic without signs/symptoms of physical distress    Intensity THRR unchanged THRR unchanged THRR unchanged THRR unchanged      Progression   Progression Continue to progress workloads to maintain intensity without signs/symptoms of physical  distress. Continue to progress workloads to maintain intensity without signs/symptoms of physical distress. Continue to progress workloads to maintain intensity without signs/symptoms of physical distress. Continue to progress workloads to maintain intensity without signs/symptoms of physical distress.    Average METs 1.5 1.6 1.6 1.5      Resistance Training   Training Prescription Yes Yes Yes Yes    Weight 1 lb 1 lb 1 lb 1 lb    Reps 10-15 10-15 10-15 10-15    Time 10 Minutes 10 Minutes 10 Minutes 10 Minutes      Interval Training   Interval Training No No No No      Recumbant Bike   Level -- 1 1 --    Watts -- 10 6 --    Minutes -- 15 15 --    METs -- 1.7 1.6 --      NuStep   Level 2 2 2 2     SPM 60 80 74 76    Minutes 15 15 15 28     METs 1.5 1.5 1.6 1.5      Home Exercise Plan   Plans to continue exercise at -- -- Home (comment)  Recumbent bike, walking Home (comment)  Recumbent bike, walking    Frequency -- -- Add 2 additional days to program exercise sessions. Add 2 additional days to program exercise sessions.    Initial Home Exercises Provided -- --  03/20/23 03/20/23             Exercise Comments:   Exercise Comments     Row Name 02/27/23 1131 03/20/23 1113         Exercise Comments Faryal tolerated first session of exercise fair without symptoms. She completed one station then had to use the restroom. She completed the warm-up and cool-down stretches with seated modification as needed. Reviewd home exercise guidelines and goals with Wm.               Exercise Goals and Review:   Exercise Goals     Row Name 02/21/23 1326             Exercise Goals   Increase Physical Activity Yes       Intervention Provide advice, education, support and counseling about physical activity/exercise needs.;Develop an individualized exercise prescription for aerobic and resistive training based on initial evaluation findings, risk stratification, comorbidities and  participant's personal goals.       Expected Outcomes Short Term: Attend rehab on a regular basis to increase amount of physical activity.;Long Term: Add in home exercise to make exercise part of routine and to increase amount of physical activity.;Long Term: Exercising regularly at least 3-5 days a week.       Increase Strength and Stamina Yes       Intervention Provide advice, education, support and counseling about physical activity/exercise needs.;Develop an individualized exercise prescription for aerobic and resistive training based on initial evaluation findings, risk stratification, comorbidities and participant's personal goals.       Expected Outcomes Short Term: Increase workloads from initial exercise prescription for resistance, speed, and METs.;Short Term: Perform resistance training exercises routinely during rehab and add in resistance training at home;Long Term: Improve cardiorespiratory fitness, muscular endurance and strength as measured by increased METs and functional capacity ( )       Able to understand and use rate of perceived exertion (RPE) scale Yes       Intervention Provide education and explanation on how to use RPE scale       Expected Outcomes Short Term: Able to use RPE daily in rehab to express subjective intensity level;Long Term:  Able to use RPE to guide intensity level when exercising independently       Knowledge and understanding of Target Heart Rate Range (THRR) Yes       Intervention Provide education and explanation of THRR including how the numbers were predicted and where they are located for reference       Expected Outcomes Short Term: Able to state/look up THRR;Long Term: Able to use THRR to govern intensity when exercising independently;Short Term: Able to use daily as guideline for intensity in rehab       Understanding of Exercise Prescription Yes       Intervention Provide education, explanation, and written materials on patient's individual exercise  prescription       Expected Outcomes Short Term: Able to explain program exercise prescription;Long Term: Able to explain home exercise prescription to exercise independently                Exercise Goals Re-Evaluation :  Exercise Goals Re-Evaluation     Row Name 02/27/23 1538 03/20/23 1113           Exercise Goal Re-Evaluation   Exercise Goals Review Increase Physical Activity;Increase Strength and Stamina;Able to understand and use rate of perceived exertion (RPE) scale Increase Physical Activity;Increase Strength and Stamina;Able to understand and  use rate of perceived exertion (RPE) scale;Able to check pulse independently;Knowledge and understanding of Target Heart Rate Range (THRR);Understanding of Exercise Prescription      Comments Riah was able to understand and use RPE scale appropriately. She completed one station then had to use the restroom. Reviewed exercise prescription with Kennon Rounds. She is riding her recumbent bike 15 minutes and walking indoors 15 minutes 2 days/week as her mode of home exercise. She has an Scientist, physiological that she can use to monitor her pulse.      Expected Outcomes Progress duration as tolerated to achieve 30 minutes of aerobic exericse in addition to the warm-up and cool-down. Shyra will exercise 30 minutes, 2 days/week in addition to exercise at cardiac rehab to achieve 150 minutes of aerobic exercise per week.               Discharge Exercise Prescription (Final Exercise Prescription Changes):  Exercise Prescription Changes - 04/10/23 1025       Response to Exercise   Blood Pressure (Admit) 122/68    Blood Pressure (Exit) 112/70    Heart Rate (Admit) 82 bpm    Heart Rate (Exercise) 114 bpm    Heart Rate (Exit) 78 bpm    Rating of Perceived Exertion (Exercise) 8    Symptoms None    Duration Progress to 30 minutes of  aerobic without signs/symptoms of physical distress    Intensity THRR unchanged      Progression   Progression Continue to  progress workloads to maintain intensity without signs/symptoms of physical distress.    Average METs 1.5      Resistance Training   Training Prescription Yes    Weight 1 lb    Reps 10-15    Time 10 Minutes      Interval Training   Interval Training No      NuStep   Level 2    SPM 76    Minutes 28    METs 1.5      Home Exercise Plan   Plans to continue exercise at Home (comment)   Recumbent bike, walking   Frequency Add 2 additional days to program exercise sessions.    Initial Home Exercises Provided 03/20/23             Nutrition:  Target Goals: Understanding of nutrition guidelines, daily intake of sodium 1500mg , cholesterol 200mg , calories 30% from fat and 7% or less from saturated fats, daily to have 5 or more servings of fruits and vegetables.  Biometrics:  Pre Biometrics - 02/21/23 1313       Pre Biometrics   Waist Circumference 48 inches    Hip Circumference 51 inches    Waist to Hip Ratio 0.94 %    Triceps Skinfold 29 mm    % Body Fat 50.2 %    Grip Strength 12 kg    Flexibility --   Not performed. Bilateral knee replacement.   Single Leg Stand --   Not performed, balance concerns.             Nutrition Therapy Plan and Nutrition Goals:  Nutrition Therapy & Goals - 04/06/23 1043       Nutrition Therapy   Diet Heart healthy diet    Drug/Food Interactions Statins/Certain Fruits      Personal Nutrition Goals   Nutrition Goal Patient to identify strategies for reducing cardiovascular risk by attending the [employer] education and nutrition series weekly.    Personal Goal #2 Patient to improve diet quality  by using the plate method as a guide for meal planning to include lean protein/plant protein, fruits, vegetables, whole grains, nonfat dairy as part of a well-balanced diet.    Comments Goals in progress. Lafawn has medical history of TAVR, hyperlipidemia. She continues to attend the [employer] education and nutrition series regularly. Lipids at  goal. Patient will benefit from participation in intensive cardiac rehab for nutrition, exercise, and lifestyle modification.      Intervention Plan   Intervention Prescribe, educate and counsel regarding individualized specific dietary modifications aiming towards targeted core components such as weight, hypertension, lipid management, diabetes, heart failure and other comorbidities.;Nutrition handout(s) given to patient.    Expected Outcomes Short Term Goal: Understand basic principles of dietary content, such as calories, fat, sodium, cholesterol and nutrients.;Long Term Goal: Adherence to prescribed nutrition plan.             Nutrition Assessments:  MEDIFICTS Score Key: >=70 Need to make dietary changes  40-70 Heart Healthy Diet <= 40 Therapeutic Level Cholesterol Diet   Flowsheet Row INTENSIVE CARDIAC REHAB from 03/20/2023 in [facility], & Lung [facility]  Picture Your Plate Total Score on Admission 76      Picture Your Plate Scores: <40 Unhealthy dietary pattern with much room for improvement. 41-50 Dietary pattern unlikely to meet recommendations for good health and room for improvement. 51-60 More healthful dietary pattern, with some room for improvement.  >60 Healthy dietary pattern, although there may be some specific behaviors that could be improved.    Nutrition Goals Re-Evaluation:  Nutrition Goals Re-Evaluation     Row Name 04/06/23 1043             Goals   Current Weight 208 lb 8.9 oz (94.6 kg)       Comment LDL 69, A1c WNL       Expected Outcome Goals in progress. Phallon has medical history of TAVR, hyperlipidemia. She continues to attend the [employer] education and nutrition series regularly. Lipids at goal. Patient will benefit from participation in intensive cardiac rehab for nutrition, exercise, and lifestyle modification.                Nutrition Goals Re-Evaluation:  Nutrition Goals Re-Evaluation      Row Name 04/06/23 1043             Goals   Current Weight 208 lb 8.9 oz (94.6 kg)       Comment LDL 69, A1c WNL       Expected Outcome Goals in progress. Lesa has medical history of TAVR, hyperlipidemia. She continues to attend the [facility] education and nutrition series regularly. Lipids at goal. Patient will benefit from participation in intensive cardiac rehab for nutrition, exercise, and lifestyle modification.                Nutrition Goals Discharge (Final Nutrition Goals Re-Evaluation):  Nutrition Goals Re-Evaluation - 04/06/23 1043       Goals   Current Weight 208 lb 8.9 oz (94.6 kg)    Comment LDL 69, A1c WNL    Expected Outcome Goals in progress. Rodneisha has medical history of TAVR, hyperlipidemia. She continues to attend the [employer] education and nutrition series regularly. Lipids at goal. Patient will benefit from participation in intensive cardiac rehab for nutrition, exercise, and lifestyle modification.             Psychosocial: Target Goals: Acknowledge presence or absence of significant depression and/or stress, maximize coping skills,  provide positive support system. Participant is able to verbalize types and ability to use techniques and skills needed for reducing stress and depression.  Initial Review & Psychosocial Screening:  Initial Psych Review & Screening - 02/21/23 1437       Family Dynamics   Good Support System? Yes   Jazmina lives alone. Narjis has her 3 sons and friends who live in the area for support     Barriers   Psychosocial barriers to participate in program There are no identifiable barriers or psychosocial needs.      Screening Interventions   Interventions Encouraged to exercise             Quality of Life Scores:  Quality of Life - 02/21/23 1344       Quality of Life   Select Quality of Life      Quality of Life Scores   Health/Function Pre 21.54 %    Socioeconomic Pre 25.83 %    Psych/Spiritual Pre 23.57 %     Family Pre 28.5 %    GLOBAL Pre 23.73 %            Scores of 19 and below usually indicate a poorer quality of life in these areas.  A difference of  2-3 points is a clinically meaningful difference.  A difference of 2-3 points in the total score of the Quality of Life Index has been associated with significant improvement in overall quality of life, self-image, physical symptoms, and general health in studies assessing change in quality of life.  PHQ-9: Review Flowsheet       02/21/2023  Depression screen PHQ 2/9  Decreased Interest 0  Down, Depressed, Hopeless 0  PHQ - 2 Score 0  Altered sleeping 0  Tired, decreased energy 0  Change in appetite 0  Feeling bad or failure about yourself  0  Trouble concentrating 0  Moving slowly or fidgety/restless 0  Suicidal thoughts 0  PHQ-9 Score 0  Difficult doing work/chores Not difficult at all   Interpretation of Total Score  Total Score Depression Severity:  1-4 = Minimal depression, 5-9 = Mild depression, 10-14 = Moderate depression, 15-19 = Moderately severe depression, 20-27 = Severe depression   Psychosocial Evaluation and Intervention:   Psychosocial Re-Evaluation:  Psychosocial Re-Evaluation     Row Name 02/28/23 1503 03/09/23 1424 04/10/23 1044         Psychosocial Re-Evaluation   Current issues with None Identified None Identified None Identified     Comments Isatou did not voice any concerns or stressors on her first day of exercise Earnest has  not voiced any increased  concerns or stressors during  exercise at cardiac rehab. Karstyn continues not to  voice any increased  concerns or stressors during  exercise at cardiac rehab.     Interventions Encouraged to attend Cardiac Rehabilitation for the exercise Encouraged to attend Cardiac Rehabilitation for the exercise Encouraged to attend Cardiac Rehabilitation for the exercise     Continue Psychosocial Services  No Follow up required No Follow up required No Follow up  required              Psychosocial Discharge (Final Psychosocial Re-Evaluation):  Psychosocial Re-Evaluation - 04/10/23 1044       Psychosocial Re-Evaluation   Current issues with None Identified    Comments Aleyah continues not to  voice any increased  concerns or stressors during  exercise at cardiac rehab.    Interventions Encouraged to attend Cardiac Rehabilitation for  the exercise    Continue Psychosocial Services  No Follow up required             Vocational Rehabilitation: Provide vocational rehab assistance to qualifying candidates.   Vocational Rehab Evaluation & Intervention:  Vocational Rehab - 02/21/23 1438       Initial Vocational Rehab Evaluation & Intervention   Assessment shows need for Vocational Rehabilitation No   Sadako is retired and does not need vocational rehab at this time            Education: Education Goals: Education classes will be provided on a weekly basis, covering required topics. Participant will state understanding/return demonstration of topics presented.    Education     Row Name 02/27/23 1300     Education   Cardiac Education Topics [employer]   Geographical information systems officer Psychosocial   Psychosocial Workshop Healthy Sleep for a Healthy Heart   Instruction Review Code 1- Verbalizes Understanding   Class Start Time 1155   Class Stop Time 1245   Class Time Calculation (min) 50 min    Row Name 03/03/23 1200     Education   Cardiac Education Topics [facility]   Nurse, children's Exercise Physiologist   Select Psychosocial   Psychosocial How Our Thoughts Can Heal Our Hearts   Instruction Review Code 1- Verbalizes Understanding   Class Start Time 1152   Class Stop Time 1226   Class Time Calculation (min) 34 min    Row Name 03/06/23 1300     Education   Cardiac Education Topics [facility]   Select Workshops     Workshops   Educator Exercise  Physiologist   Select Exercise   Exercise Workshop Managing Heart Disease: Your Path to a Healthier Heart   Instruction Review Code 1- Verbalizes Understanding   Class Start Time 1142   Class Stop Time 1238   Class Time Calculation (min) 56 min    Row Name 03/08/23 1500     Education   Cardiac Education Topics [employer]   Customer service manager   Weekly Topic Simple Sides and Sauces   Instruction Review Code 1- Verbalizes Understanding   Class Start Time 1145   Class Stop Time 1226   Class Time Calculation (min) 41 min    Row Name 03/10/23 1200     Education   Cardiac Education Topics [facility]   Hospital doctor Education   General Education Hypertension and Heart Disease   Instruction Review Code 1- Verbalizes Understanding   Class Start Time 1148   Class Stop Time 1225   Class Time Calculation (min) 37 min    Row Name 03/13/23 1100     Education   Cardiac Education Topics [employer]in   Geographical information systems officer Psychosocial   Psychosocial Workshop From Head to Heart: The Power of a Healthy Outlook   Instruction Review Code 1- Verbalizes Understanding   Class Start Time 1148   Class Stop Time 1235   Class Time Calculation (min) 47 min    Row Name 03/24/23 1500     Education   Cardiac Education Topics Pritikin   Psychologist, sport and exercise     Core Videos  Educator Dietitian   Select Nutrition   Nutrition Overview of the Pritikin Eating Plan   Instruction Review Code 1- Verbalizes Understanding   Class Start Time 1145   Class Stop Time 1238   Class Time Calculation (min) 53 min    Row Name 03/27/23 1100     Education   Cardiac Education Topics Pritikin   Nurse, children's Exercise Physiologist   Select Psychosocial   Psychosocial Healthy Minds, Bodies, Hearts   Instruction  Review Code 1- Verbalizes Understanding   Class Start Time 1155   Class Stop Time 1240   Class Time Calculation (min) 45 min    Row Name 03/31/23 1200     Education   Cardiac Education Topics Pritikin   Select Core Videos     Core Videos   Educator Dietitian   Select Nutrition   Nutrition Other  Label Reading   Instruction Review Code 1- Verbalizes Understanding   Class Start Time 1152   Class Stop Time 1233   Class Time Calculation (min) 41 min    Row Name 04/03/23 1100     Education   Cardiac Education Topics Pritikin   Select Core Videos     Core Videos   Educator Nurse   Select Nutrition   Nutrition Becoming a Pritikin Chef   Instruction Review Code 1- Verbalizes Understanding   Class Start Time 1150   Class Stop Time 1226   Class Time Calculation (min) 36 min    Row Name 04/05/23 1300     Education   Cardiac Education Topics Pritikin   Secondary school teacher School   Educator Dietitian;Nurse   Weekly Topic Personalizing Your Pritikin Plate   Instruction Review Code 1- Verbalizes Understanding   Class Start Time 1149   Class Stop Time 1233   Class Time Calculation (min) 44 min    Row Name 04/07/23 1400     Education   Cardiac Education Topics Pritikin   Glass blower/designer Nutrition   Nutrition Workshop Label Reading   Instruction Review Code 1- Verbalizes Understanding   Class Start Time 1145   Class Stop Time 1230   Class Time Calculation (min) 45 min    Row Name 04/10/23 1100     Education   Cardiac Education Topics Pritikin   Select Workshops     Workshops   Educator Exercise Physiologist   Select Psychosocial   Psychosocial Workshop Recognizing and Reducing Stress   Instruction Review Code 1- Verbalizes Understanding   Class Start Time 1145   Class Stop Time 1228   Class Time Calculation (min) 43 min            Core Videos: Exercise    Move It!  Clinical staff conducted  group or individual video education with verbal and written material and guidebook.  Patient learns the recommended Pritikin exercise program. Exercise with the goal of living a long, healthy life. Some of the health benefits of exercise include controlled diabetes, healthier blood pressure levels, improved cholesterol levels, improved heart and lung capacity, improved sleep, and better body composition. Everyone should speak with their doctor before starting or changing an exercise routine.  Biomechanical Limitations Clinical staff conducted group or individual video education with verbal and written material and guidebook.  Patient learns how biomechanical limitations can impact exercise and how we can mitigate and possibly  overcome limitations to have an impactful and balanced exercise routine.  Body Composition Clinical staff conducted group or individual video education with verbal and written material and guidebook.  Patient learns that body composition (ratio of muscle mass to fat mass) is a key component to assessing overall fitness, rather than body weight alone. Increased fat mass, especially visceral belly fat, can put [place] at increased risk for metabolic syndrome, type 2 diabetes, heart disease, and even death. It is recommended to combine diet and exercise (cardiovascular and resistance training) to improve your body composition. Seek guidance from your physician and exercise physiologist before implementing an exercise routine.  Exercise Action Plan Clinical staff conducted group or individual video education with verbal and written material and guidebook.  Patient learns the recommended strategies to achieve and enjoy long-term exercise adherence, including variety, self-motivation, self-efficacy, and positive decision making. Benefits of exercise include fitness, good health, weight management, more energy, better sleep, less stress, and overall well-being.  Medical   Heart Disease Risk  Reduction Clinical staff conducted group or individual video education with verbal and written material and guidebook.  Patient learns our heart is our most vital organ as it circulates oxygen, nutrients, white blood cells, and hormones throughout the entire body, and carries waste away. Data supports a plant-based eating plan like the Pritikin Program for its effectiveness in slowing progression of and reversing heart disease. The video provides a number of recommendations to address heart disease.   Metabolic Syndrome and Belly Fat  Clinical staff conducted group or individual video education with verbal and written material and guidebook.  Patient learns what metabolic syndrome is, how it leads to heart disease, and how one can reverse it and keep it from coming back. You have metabolic syndrome if you have 3 of the following 5 criteria: abdominal obesity, high blood pressure, high triglycerides, low HDL cholesterol, and high blood sugar.  Hypertension and Heart Disease Clinical staff conducted group or individual video education with verbal and written material and guidebook.  Patient learns that high blood pressure, or hypertension, is very common in the [place]. Hypertension is largely due to excessive salt intake, but other important risk factors include being overweight, physical inactivity, drinking too much alcohol, smoking, and not eating enough potassium from fruits and vegetables. High blood pressure is a leading risk factor for heart attack, stroke, congestive heart failure, dementia, kidney failure, and premature death. Long-term effects of excessive salt intake include stiffening of the arteries and thickening of heart muscle and organ damage. Recommendations include ways to reduce hypertension and the risk of heart disease.  Diseases of Our Time - Focusing on Diabetes Clinical staff conducted group or individual video education with verbal and written material and guidebook.   Patient learns why the best way to stop diseases of our time is prevention, through food and other lifestyle changes. Medicine (such as prescription pills and surgeries) is often only a Band-Aid on the problem, not a long-term solution. Most common diseases of our time include obesity, type 2 diabetes, hypertension, heart disease, and cancer. The Pritikin Program is recommended and has been proven to help reduce, reverse, and/or prevent the damaging effects of metabolic syndrome.  Nutrition   Overview of the Pritikin Eating Plan  Clinical staff conducted group or individual video education with verbal and written material and guidebook.  Patient learns about the Pritikin Eating Plan for disease risk reduction. The Pritikin Eating Plan emphasizes a wide variety of unrefined, minimally-processed carbohydrates, like fruits, vegetables,  whole grains, and legumes. Go, Caution, and Stop food choices are explained. Plant-based and lean animal proteins are emphasized. Rationale provided for low sodium intake for blood pressure control, low added sugars for blood sugar stabilization, and low added fats and oils for coronary artery disease risk reduction and weight management.  Calorie Density  Clinical staff conducted group or individual video education with verbal and written material and guidebook.  Patient learns about calorie density and how it impacts the Pritikin Eating Plan. Knowing the characteristics of the food you choose will help you decide whether those foods will lead to weight gain or weight loss, and whether you want to consume more or less of them. Weight loss is usually a side effect of the Pritikin Eating Plan because of its focus on low calorie-dense foods.  Label Reading  Clinical staff conducted group or individual video education with verbal and written material and guidebook.  Patient learns about the Pritikin recommended label reading guidelines and corresponding recommendations  regarding calorie density, added sugars, sodium content, and whole grains.  Dining Out - Part 1  Clinical staff conducted group or individual video education with verbal and written material and guidebook.  Patient learns that restaurant meals can be sabotaging because they can be so high in calories, fat, sodium, and/or sugar. Patient learns recommended strategies on how to positively address this and avoid unhealthy pitfalls.  Facts on Fats  Clinical staff conducted group or individual video education with verbal and written material and guidebook.  Patient learns that lifestyle modifications can be just as effective, if not more so, as many medications for lowering your risk of heart disease. A Pritikin lifestyle can help to reduce your risk of inflammation and atherosclerosis (cholesterol build-up, or plaque, in the artery walls). Lifestyle interventions such as dietary choices and physical activity address the cause of atherosclerosis. A review of the types of fats and their impact on blood cholesterol levels, along with dietary recommendations to reduce fat intake is also included.  Nutrition Action Plan  Clinical staff conducted group or individual video education with verbal and written material and guidebook.  Patient learns how to incorporate Pritikin recommendations into their lifestyle. Recommendations include planning and keeping personal health goals in mind as an important part of their success.  Healthy Mind-Set    Healthy Minds, Bodies, Hearts  Clinical staff conducted group or individual video education with verbal and written material and guidebook.  Patient learns how to identify when they are stressed. Video will discuss the impact of that stress, as well as the many benefits of stress management. Patient will also be introduced to stress management techniques. The way we think, act, and feel has an impact on our hearts.  How Our Thoughts Can Heal Our Hearts  Clinical staff  conducted group or individual video education with verbal and written material and guidebook.  Patient learns that negative thoughts can cause depression and anxiety. This can result in negative lifestyle behavior and serious health problems. Cognitive behavioral therapy is an effective method to help control our thoughts in order to change and improve our emotional outlook.  Additional Videos:  Exercise    Improving Performance  Clinical staff conducted group or individual video education with verbal and written material and guidebook.  Patient learns to use a non-linear approach by alternating intensity levels and lengths of time spent exercising to help burn more calories and lose more body fat. Cardiovascular exercise helps improve heart health, metabolism, hormonal balance, blood sugar control,  and recovery from fatigue. Resistance training improves strength, endurance, balance, coordination, reaction time, metabolism, and muscle mass. Flexibility exercise improves circulation, posture, and balance. Seek guidance from your physician and exercise physiologist before implementing an exercise routine and learn your capabilities and proper form for all exercise.  Introduction to Yoga  Clinical staff conducted group or individual video education with verbal and written material and guidebook.  Patient learns about yoga, a discipline of the coming together of mind, breath, and body. The benefits of yoga include improved flexibility, improved range of motion, better posture and core strength, increased lung function, weight loss, and positive self-image. Yoga's heart health benefits include lowered blood pressure, healthier heart rate, decreased cholesterol and triglyceride levels, improved immune function, and reduced stress. Seek guidance from your physician and exercise physiologist before implementing an exercise routine and learn your capabilities and proper form for all exercise.  Medical   Aging:  Enhancing Your Quality of Life  Clinical staff conducted group or individual video education with verbal and written material and guidebook.  Patient learns key strategies and recommendations to stay in good physical health and enhance quality of life, such as prevention strategies, having an advocate, securing a Health Care Proxy and Power of Attorney, and keeping a list of medications and system for tracking them. It also discusses how to avoid risk for bone loss.  Biology of Weight Control  Clinical staff conducted group or individual video education with verbal and written material and guidebook.  Patient learns that weight gain occurs because we consume more calories than we burn (eating more, moving less). Even if your body weight is normal, you may have higher ratios of fat compared to muscle mass. Too much body fat puts you at increased risk for cardiovascular disease, heart attack, stroke, type 2 diabetes, and obesity-related cancers. In addition to exercise, following the Pritikin Eating Plan can help reduce your risk.  Decoding Lab Results  Clinical staff conducted group or individual video education with verbal and written material and guidebook.  Patient learns that lab test reflects one measurement whose values change over time and are influenced by many factors, including medication, stress, sleep, exercise, food, hydration, pre-existing medical conditions, and more. It is recommended to use the knowledge from this video to become more involved with your lab results and evaluate your numbers to speak with your doctor.   Diseases of Our Time - Overview  Clinical staff conducted group or individual video education with verbal and written material and guidebook.  Patient learns that according to the CDC, 50% to 70% of chronic diseases (such as obesity, type 2 diabetes, elevated lipids, hypertension, and heart disease) are avoidable through lifestyle improvements including healthier food  choices, listening to satiety cues, and increased physical activity.  Sleep Disorders Clinical staff conducted group or individual video education with verbal and written material and guidebook.  Patient learns how good quality and duration of sleep are important to overall health and well-being. Patient also learns about sleep disorders and how they impact health along with recommendations to address them, including discussing with a physician.  Nutrition  Dining Out - Part 2 Clinical staff conducted group or individual video education with verbal and written material and guidebook.  Patient learns how to plan ahead and communicate in order to maximize their dining experience in a healthy and nutritious manner. Included are recommended food choices based on the type of restaurant the patient is visiting.   Fueling a Banker  conducted group or individual video education with verbal and written material and guidebook.  There is a strong connection between our food choices and our health. Diseases like obesity and type 2 diabetes are very prevalent and are in large-part due to lifestyle choices. The Pritikin Eating Plan provides plenty of food and hunger-curbing satisfaction. It is easy to follow, affordable, and helps reduce health risks.  Menu Workshop  Clinical staff conducted group or individual video education with verbal and written material and guidebook.  Patient learns that restaurant meals can sabotage health goals because they are often packed with calories, fat, sodium, and sugar. Recommendations include strategies to plan ahead and to communicate with the manager, chef, or server to help order a healthier meal.  Planning Your Eating Strategy  Clinical staff conducted group or individual video education with verbal and written material and guidebook.  Patient learns about the Pritikin Eating Plan and its benefit of reducing the risk of disease. The Pritikin Eating  Plan does not focus on calories. Instead, it emphasizes high-quality, nutrient-rich foods. By knowing the characteristics of the foods, we choose, we can determine their calorie density and make informed decisions.  Targeting Your Nutrition Priorities  Clinical staff conducted group or individual video education with verbal and written material and guidebook.  Patient learns that lifestyle habits have a tremendous impact on disease risk and progression. This video provides eating and physical activity recommendations based on your personal health goals, such as reducing LDL cholesterol, losing weight, preventing or controlling type 2 diabetes, and reducing high blood pressure.  Vitamins and Minerals  Clinical staff conducted group or individual video education with verbal and written material and guidebook.  Patient learns different ways to obtain key vitamins and minerals, including through a recommended healthy diet. It is important to discuss all supplements you take with your doctor.   Healthy Mind-Set    Smoking Cessation  Clinical staff conducted group or individual video education with verbal and written material and guidebook.  Patient learns that cigarette smoking and tobacco addiction pose a serious health risk which affects millions of people. Stopping smoking will significantly reduce the risk of heart disease, lung disease, and many forms of cancer. Recommended strategies for quitting are covered, including working with your doctor to develop a successful plan.  Culinary   Becoming a Set designer conducted group or individual video education with verbal and written material and guidebook.  Patient learns that cooking at home can be healthy, cost-effective, quick, and puts them in control. Keys to cooking healthy recipes will include looking at your recipe, assessing your equipment needs, planning ahead, making it simple, choosing cost-effective seasonal ingredients,  and limiting the use of added fats, salts, and sugars.  Cooking - Breakfast and Snacks  Clinical staff conducted group or individual video education with verbal and written material and guidebook.  Patient learns how important breakfast is to satiety and nutrition through the entire day. Recommendations include key foods to eat during breakfast to help stabilize blood sugar levels and to prevent overeating at meals later in the day. Planning ahead is also a key component.  Cooking - Educational psychologist conducted group or individual video education with verbal and written material and guidebook.  Patient learns eating strategies to improve overall health, including an approach to cook more at home. Recommendations include thinking of animal protein as a side on your plate rather than center stage and focusing instead on lower calorie dense  options like vegetables, fruits, whole grains, and plant-based proteins, such as beans. Making sauces in large quantities to freeze for later and leaving the skin on your vegetables are also recommended to maximize your experience.  Cooking - Healthy Salads and Dressing Clinical staff conducted group or individual video education with verbal and written material and guidebook.  Patient learns that vegetables, fruits, whole grains, and legumes are the foundations of the Pritikin Eating Plan. Recommendations include how to incorporate each of these in flavorful and healthy salads, and how to create homemade salad dressings. Proper handling of ingredients is also covered. Cooking - Soups and [employer] - Soups and Desserts Clinical staff conducted group or individual video education with verbal and written material and guidebook.  Patient learns that Pritikin soups and desserts make for easy, nutritious, and delicious snacks and meal components that are low in sodium, fat, sugar, and calorie density, while high in vitamins, minerals, and filling  fiber. Recommendations include simple and healthy ideas for soups and desserts.   Overview     The Pritikin Solution Program Overview Clinical staff conducted group or individual video education with verbal and written material and guidebook.  Patient learns that the results of the Pritikin Program have been documented in more than 100 articles published in peer-reviewed journals, and the benefits include reducing risk factors for (and, in some cases, even reversing) high cholesterol, high blood pressure, type 2 diabetes, obesity, and more! An overview of the three key pillars of the Pritikin Program will be covered: eating well, doing regular exercise, and having a healthy mind-set.  WORKSHOPS  Exercise: Exercise Basics: Building Your Action Plan Clinical staff led group instruction and group discussion with PowerPoint presentation and patient guidebook. To enhance the learning environment the use of posters, models and videos may be added. At the conclusion of this workshop, patients will comprehend the difference between physical activity and exercise, as well as the benefits of incorporating both, into their routine. Patients will understand the FITT (Frequency, Intensity, Time, and Type) principle and how to use it to build an exercise action plan. In addition, safety concerns and other considerations for exercise and cardiac rehab will be addressed by the presenter. The purpose of this lesson is to promote a comprehensive and effective weekly exercise routine in order to improve patients' overall level of fitness.   Managing Heart Disease: Your Path to a Healthier Heart Clinical staff led group instruction and group discussion with PowerPoint presentation and patient guidebook. To enhance the learning environment the use of posters, models and videos may be added.At the conclusion of this workshop, patients will understand the anatomy and physiology of the heart. Additionally, they will  understand how Pritikin's three pillars impact the risk factors, the progression, and the management of heart disease.  The purpose of this lesson is to provide a high-level overview of the heart, heart disease, and how the Pritikin lifestyle positively impacts risk factors.  Exercise Biomechanics Clinical staff led group instruction and group discussion with PowerPoint presentation and patient guidebook. To enhance the learning environment the use of posters, models and videos may be added. Patients will learn how the structural parts of their bodies function and how these functions impact their daily activities, movement, and exercise. Patients will learn how to promote a neutral spine, learn how to manage pain, and identify ways to improve their physical movement in order to promote healthy living. The purpose of this lesson is to expose patients to common physical limitations  that impact physical activity. Participants will learn practical ways to adapt and manage aches and pains, and to minimize their effect on regular exercise. Patients will learn how to maintain good posture while sitting, walking, and lifting.  Balance Training and Fall Prevention  Clinical staff led group instruction and group discussion with PowerPoint presentation and patient guidebook. To enhance the learning environment the use of posters, models and videos may be added. At the conclusion of this workshop, patients will understand the importance of their sensorimotor skills (vision, proprioception, and the vestibular system) in maintaining their ability to balance as they age. Patients will apply a variety of balancing exercises that are appropriate for their current level of function. Patients will understand the common causes for poor balance, possible solutions to these problems, and ways to modify their physical environment in order to minimize their fall risk. The purpose of this lesson is to teach patients  about the importance of maintaining balance as they age and ways to minimize their risk of falling.  WORKSHOPS   Nutrition:  Fueling a Ship broker led group instruction and group discussion with PowerPoint presentation and patient guidebook. To enhance the learning environment the use of posters, models and videos may be added. Patients will review the foundational principles of the Pritikin Eating Plan and understand what constitutes a serving size in each of the food groups. Patients will also learn [facility]in-friendly foods that are better choices when away from home and review make-ahead meal and snack options. Calorie density will be reviewed and applied to three nutrition priorities: weight maintenance, weight loss, and weight gain. The purpose of this lesson is to reinforce (in a group setting) the key concepts around what patients are recommended to eat and how to apply these guidelines when away from home by planning and selecting Pritikin-friendly options. Patients will understand how calorie density may be adjusted for different weight management goals.  Mindful Eating  Clinical staff led group instruction and group discussion with PowerPoint presentation and patient guidebook. To enhance the learning environment the use of posters, models and videos may be added. Patients will briefly review the concepts of the [facility]in Eating Plan and the importance of low-calorie dense foods. The concept of mindful eating will be introduced as well as the importance of paying attention to internal hunger signals. Triggers for non-hunger eating and techniques for dealing with triggers will be explored. The purpose of this lesson is to provide patients with the opportunity to review the basic principles of the [facility]in Eating Plan, discuss the value of eating mindfully and how to measure internal cues of hunger and fullness using the Hunger Scale. Patients will also discuss reasons for non-hunger  eating and learn strategies to use for controlling emotional eating.  Targeting Your Nutrition Priorities Clinical staff led group instruction and group discussion with PowerPoint presentation and patient guidebook. To enhance the learning environment the use of posters, models and videos may be added. Patients will learn how to determine their genetic susceptibility to disease by reviewing their family history. Patients will gain insight into the importance of diet as part of an overall healthy lifestyle in mitigating the impact of genetics and other environmental insults. The purpose of this lesson is to provide patients with the opportunity to assess their personal nutrition priorities by looking at their family history, their own health history and current risk factors. Patients will also be able to discuss ways of prioritizing and modifying the [facility]in Eating Plan for their highest  risk areas  Menu  Clinical staff led group instruction and group discussion with PowerPoint presentation and patient guidebook. To enhance the learning environment the use of posters, models and videos may be added. Using menus brought in from [employer], or printed from [employer], patients will apply the Pritikin dining out guidelines that were presented in the [employer] video. Patients will also be able to practice these guidelines in a variety of provided scenarios. The purpose of this lesson is to provide patients with the opportunity to practice hands-on learning of the [facility]in Dining Out guidelines with actual menus and practice scenarios.  Label Reading Clinical staff led group instruction and group discussion with PowerPoint presentation and patient guidebook. To enhance the learning environment the use of posters, models and videos may be added. Patients will review and discuss the [facility]in label reading guidelines presented in [employer]'s Label Reading Educational series video.  Using fool labels brought in from local grocery stores and markets, patients will apply the label reading guidelines and determine if the packaged food meet the Pritikin guidelines. The purpose of this lesson is to provide patients with the opportunity to review, discuss, and practice hands-on learning of the [facility]rit[facility]in Label Reading guidelines with actual packaged food labels. Cooking School  [facility]'s [employer] are designed to teach patients ways to prepare quick, simple, and affordable recipes at home. The importance of nutrition's role in chronic disease risk reduction is reflected in its emphasis in the overall [facility]in program. By learning how to prepare essential core P[facility]in Eating Plan recipes, patients will increase control over what they eat; be able to customize the flavor of foods without the use of added salt, sugar, or fat; and improve the quality of the food they consume. By learning a set of core recipes which are easily assembled, quickly prepared, and affordable, patients are more likely to prepare more healthy foods at home. These workshops focus on convenient breakfasts, simple entres, side dishes, and desserts which can be prepared with minimal effort and are consistent with nutrition recommendations for cardiovascular risk reduction. Cooking [employer] are taught by a Armed forces logistics/support/administrative officer (RD) who has been trained by the [employer]. The chef or RD has a clear understanding of the importance of minimizing - if not completely eliminating - added fat, sugar, and sodium in recipes. Throughout the series of Cooking School Workshop sessions, patients will learn about healthy ingredients and efficient methods of cooking to build confidence in their capability to prepare    Cooking School weekly topics:  Adding Flavor- Sodium-Free  Fast and Healthy Breakfasts  Powerhouse Plant-Based Proteins  Satisfying Salads and Dressings  Simple Sides and  Sauces  International Cuisine-Spotlight on the [employer] Zones  Delicious Desserts  Savory Soups  [employer] - Meals in a Astronomer Appetizers and Snacks  Comforting Weekend Breakfasts  One-Pot Wonders   Fast Evening Meals  Landscape architect Your Pritikin Plate  WORKSHOPS   Healthy Mindset (Psychosocial):  Focused Goals, Sustainable Changes Clinical staff led group instruction and group discussion with PowerPoint presentation and patient guidebook. To enhance the learning environment the use of posters, models and videos may be added. Patients will be able to apply effective goal setting strategies to establish at least one personal goal, and then take consistent, meaningful action toward that goal. They will learn to identify common barriers to achieving personal goals and develop strategies to overcome them. Patients will also gain an understanding  of how our mind-set can impact our ability to achieve goals and the importance of cultivating a positive and growth-oriented mind-set. The purpose of this lesson is to provide patients with a deeper understanding of how to set and achieve personal goals, as well as the tools and strategies needed to overcome common obstacles which may arise along the way.  From Head to Heart: The Power of a Healthy Outlook  Clinical staff led group instruction and group discussion with PowerPoint presentation and patient guidebook. To enhance the learning environment the use of posters, models and videos may be added. Patients will be able to recognize and describe the impact of emotions and mood on physical health. They will discover the importance of self-care and explore self-care practices which may work for them. Patients will also learn how to utilize the 4 C's to cultivate a healthier outlook and better manage stress and challenges. The purpose of this lesson is to demonstrate to patients how a healthy outlook is an essential part of  maintaining good health, especially as they continue their cardiac rehab journey.  Healthy Sleep for a Healthy Heart Clinical staff led group instruction and group discussion with PowerPoint presentation and patient guidebook. To enhance the learning environment the use of posters, models and videos may be added. At the conclusion of this workshop, patients will be able to demonstrate knowledge of the importance of sleep to overall health, well-being, and quality of life. They will understand the symptoms of, and treatments for, common sleep disorders. Patients will also be able to identify daytime and nighttime behaviors which impact sleep, and they will be able to apply these tools to help manage sleep-related challenges. The purpose of this lesson is to provide patients with a general overview of sleep and outline the importance of quality sleep. Patients will learn about a few of the most common sleep disorders. Patients will also be introduced to the concept of "sleep hygiene," and discover ways to self-manage certain sleeping problems through simple daily behavior changes. Finally, the workshop will motivate patients by clarifying the links between quality sleep and their goals of heart-healthy living.   Recognizing and Reducing Stress Clinical staff led group instruction and group discussion with PowerPoint presentation and patient guidebook. To enhance the learning environment the use of posters, models and videos may be added. At the conclusion of this workshop, patients will be able to understand the types of stress reactions, differentiate between acute and chronic stress, and recognize the impact that chronic stress has on their health. They will also be able to apply different coping mechanisms, such as reframing negative self-talk. Patients will have the opportunity to practice a variety of stress management techniques, such as deep abdominal breathing, progressive muscle relaxation, and/or  guided imagery.  The purpose of this lesson is to educate patients on the role of stress in their lives and to provide healthy techniques for coping with it.  Learning Barriers/Preferences:  Learning Barriers/Preferences - 02/21/23 1523       Learning Barriers/Preferences   Learning Barriers Sight;Exercise Concerns   wears reading glasses. Walks very slow due to multiple knee surgeries uses a rolling walker   Learning Preferences Verbal Instruction;Pictoral             Education Topics:  Knowledge Questionnaire Score:  Knowledge Questionnaire Score - 02/21/23 1344       Knowledge Questionnaire Score   Pre Score 24/24             Core Components/Risk  Factors/Patient Goals at Admission:  Personal Goals and Risk Factors at Admission - 02/21/23 1344       Core Components/Risk Factors/Patient Goals on Admission    Weight Management Yes;Obesity;Weight Loss    Intervention Weight Management/Obesity: Establish reasonable short term and long term weight goals.;Obesity: Provide education and appropriate resources to help participant work on and attain dietary goals.    Expected Outcomes Short Term: Continue to assess and modify interventions until short term weight is achieved;Long Term: Adherence to nutrition and physical activity/exercise program aimed toward attainment of established weight goal;Weight Loss: Understanding of general recommendations for a balanced deficit meal plan, which promotes 1-2 lb weight loss per week and includes a negative energy balance of [phone] kcal/d    Hypertension Yes    Intervention Provide education on lifestyle modifcations including regular physical activity/exercise, weight management, moderate sodium restriction and increased consumption of fresh fruit, vegetables, and low fat dairy, alcohol moderation, and smoking cessation.;Monitor prescription use compliance.    Expected Outcomes Short Term: Continued assessment and intervention until BP is  < 140/69mm HG in hypertensive participants. < 130/1mm HG in hypertensive participants with diabetes, heart failure or chronic kidney disease.;Long Term: Maintenance of blood pressure at goal levels.    Lipids Yes    Intervention Provide education and support for participant on nutrition & aerobic/resistive exercise along with prescribed medications to achieve LDL 70mg , HDL >40mg .    Expected Outcomes Short Term: Participant states understanding of desired cholesterol values and is compliant with medications prescribed. Participant is following exercise prescription and nutrition guidelines.;Long Term: Cholesterol controlled with medications as prescribed, with individualized exercise RX and with personalized nutrition plan. Value goals: LDL < 70mg , HDL > 40 mg.             Core Components/Risk Factors/Patient Goals Review:   Goals and Risk Factor Review     Row Name 02/28/23 1510 03/09/23 1425 04/10/23 1045         Core Components/Risk Factors/Patient Goals Review   Personal Goals Review Weight Management/Obesity;Hypertension;Lipids Weight Management/Obesity;Hypertension;Lipids Weight Management/Obesity;Hypertension;Lipids     Review Luceil started cardiac rehab on 02/27/23. Kerissa did well with exercise for her fintness level as Talishia is somewhat deconditioned. Vital signs were stable. Thora started cardiac rehab on 02/27/23. Satori is off to a good start to exercise for her fitness level.Hasini is somewhat deconditioned. Vital signs have been stable. Chaniya is enjoying participating in cardiac rehab.  Vital signs remain stable.Stormi's continues to have slow progress.     Expected Outcomes Marcile will continue to participate in cardiac rehab for exercise, nutrition and lifestyle modifications. Irma will continue to participate in cardiac rehab for exercise, nutrition and lifestyle modifications. Krystn will continue to participate in cardiac rehab for exercise, nutrition and lifestyle  modifications.              Core Components/Risk Factors/Patient Goals at Discharge (Final Review):   Goals and Risk Factor Review - 04/10/23 1045       Core Components/Risk Factors/Patient Goals Review   Personal Goals Review Weight Management/Obesity;Hypertension;Lipids    Review Penni is enjoying participating in cardiac rehab.  Vital signs remain stable.[facility]'s continues to have slow progress.    Expected Outcomes Ginette will continue to participate in cardiac rehab for exercise, nutrition and lifestyle modifications.             ITP Comments:  ITP Comments     Row Name 02/21/23 1313 02/28/23 1457 03/09/23 1423 04/10/23 1043     ITP  Comments Medical Director- Dr. Armanda Magic, MD. Introduction to the [facility] Education Program / Intensive Cardiac Rehab. Reviewed initial orientation folder. 30 day ITP Review. Tanasha started cardiac rehab on 02/27/23. Leinani did well with exercise for her fitness level 30 day ITP Review. Shamone started cardiac rehab on 02/27/23. Rosibel is off to a good start to  exercise for her fitness level 30 day ITP Review. Ambar started cardiac rehab on 02/27/23. Zilpha has good attendance and participation with  exercise for her fitness level             Comments: See ITP Comments

## 2023-04-12 ENCOUNTER — Encounter (HOSPITAL_COMMUNITY): Payer: Medicare Other

## 2023-04-14 ENCOUNTER — Encounter (HOSPITAL_COMMUNITY)
Admission: RE | Admit: 2023-04-14 | Discharge: 2023-04-14 | Disposition: A | Discharge status: Home / Self Care | Payer: Medicare Other | Source: Ambulatory Visit | Attending: Cardiovascular Disease | Admitting: Cardiovascular Disease

## 2023-04-14 DIAGNOSIS — Z952 Presence of prosthetic heart valve: Secondary | ICD-10-CM

## 2023-04-14 DIAGNOSIS — Z48812 Encounter for surgical aftercare following surgery on the circulatory system: Secondary | ICD-10-CM | POA: Diagnosis not present

## 2023-04-17 ENCOUNTER — Encounter (HOSPITAL_COMMUNITY)
Admission: RE | Admit: 2023-04-17 | Discharge: 2023-04-17 | Disposition: A | Discharge status: Home / Self Care | Payer: Medicare Other | Source: Ambulatory Visit | Attending: Cardiovascular Disease

## 2023-04-17 DIAGNOSIS — Z48812 Encounter for surgical aftercare following surgery on the circulatory system: Secondary | ICD-10-CM | POA: Diagnosis not present

## 2023-04-17 DIAGNOSIS — Z952 Presence of prosthetic heart valve: Secondary | ICD-10-CM | POA: Diagnosis not present

## 2023-04-19 ENCOUNTER — Encounter (HOSPITAL_COMMUNITY)
Admission: RE | Admit: 2023-04-19 | Discharge: 2023-04-19 | Disposition: A | Discharge status: Home / Self Care | Payer: Medicare Other | Source: Ambulatory Visit | Attending: Cardiovascular Disease | Admitting: Cardiovascular Disease

## 2023-04-19 DIAGNOSIS — Z952 Presence of prosthetic heart valve: Secondary | ICD-10-CM

## 2023-04-19 DIAGNOSIS — Z48812 Encounter for surgical aftercare following surgery on the circulatory system: Secondary | ICD-10-CM | POA: Diagnosis not present

## 2023-04-21 ENCOUNTER — Encounter (HOSPITAL_COMMUNITY)
Admission: RE | Admit: 2023-04-21 | Discharge: 2023-04-21 | Disposition: A | Discharge status: Home / Self Care | Payer: Medicare Other | Source: Ambulatory Visit | Attending: Cardiovascular Disease | Admitting: Cardiovascular Disease

## 2023-04-21 DIAGNOSIS — Z952 Presence of prosthetic heart valve: Secondary | ICD-10-CM | POA: Diagnosis not present

## 2023-04-21 DIAGNOSIS — Z48812 Encounter for surgical aftercare following surgery on the circulatory system: Secondary | ICD-10-CM | POA: Diagnosis not present

## 2023-04-24 ENCOUNTER — Encounter (HOSPITAL_COMMUNITY)
Admission: RE | Admit: 2023-04-24 | Discharge: 2023-04-24 | Disposition: A | Discharge status: Home / Self Care | Payer: Medicare Other | Source: Ambulatory Visit | Attending: Cardiovascular Disease

## 2023-04-24 DIAGNOSIS — Z952 Presence of prosthetic heart valve: Secondary | ICD-10-CM | POA: Diagnosis not present

## 2023-04-24 DIAGNOSIS — Z48812 Encounter for surgical aftercare following surgery on the circulatory system: Secondary | ICD-10-CM | POA: Diagnosis not present

## 2023-04-26 ENCOUNTER — Encounter (HOSPITAL_COMMUNITY)
Admission: RE | Admit: 2023-04-26 | Discharge: 2023-04-26 | Disposition: A | Discharge status: Home / Self Care | Payer: Medicare Other | Source: Ambulatory Visit | Attending: Cardiovascular Disease | Admitting: Cardiovascular Disease

## 2023-04-26 DIAGNOSIS — Z48812 Encounter for surgical aftercare following surgery on the circulatory system: Secondary | ICD-10-CM | POA: Diagnosis not present

## 2023-04-26 DIAGNOSIS — Z952 Presence of prosthetic heart valve: Secondary | ICD-10-CM

## 2023-04-28 ENCOUNTER — Encounter (HOSPITAL_COMMUNITY)
Admission: RE | Admit: 2023-04-28 | Discharge: 2023-04-28 | Disposition: A | Discharge status: Home / Self Care | Payer: Medicare Other | Source: Ambulatory Visit | Attending: Cardiovascular Disease | Admitting: Cardiovascular Disease

## 2023-04-28 DIAGNOSIS — Z952 Presence of prosthetic heart valve: Secondary | ICD-10-CM

## 2023-04-28 DIAGNOSIS — Z48812 Encounter for surgical aftercare following surgery on the circulatory system: Secondary | ICD-10-CM | POA: Diagnosis not present

## 2023-05-01 ENCOUNTER — Encounter (HOSPITAL_COMMUNITY)
Admission: RE | Admit: 2023-05-01 | Discharge: 2023-05-01 | Disposition: A | Discharge status: Home / Self Care | Payer: Medicare Other | Source: Ambulatory Visit | Attending: Cardiovascular Disease | Admitting: Cardiovascular Disease

## 2023-05-01 DIAGNOSIS — Z48812 Encounter for surgical aftercare following surgery on the circulatory system: Secondary | ICD-10-CM | POA: Diagnosis not present

## 2023-05-01 DIAGNOSIS — Z952 Presence of prosthetic heart valve: Secondary | ICD-10-CM

## 2023-05-03 ENCOUNTER — Encounter (HOSPITAL_COMMUNITY)
Admission: RE | Admit: 2023-05-03 | Discharge: 2023-05-03 | Disposition: A | Discharge status: Home / Self Care | Payer: Medicare Other | Source: Ambulatory Visit | Attending: Cardiovascular Disease

## 2023-05-03 DIAGNOSIS — Z48812 Encounter for surgical aftercare following surgery on the circulatory system: Secondary | ICD-10-CM | POA: Diagnosis not present

## 2023-05-03 DIAGNOSIS — Z952 Presence of prosthetic heart valve: Secondary | ICD-10-CM | POA: Diagnosis not present

## 2023-05-05 ENCOUNTER — Encounter (HOSPITAL_COMMUNITY)
Admission: RE | Admit: 2023-05-05 | Discharge: 2023-05-05 | Disposition: A | Discharge status: Home / Self Care | Payer: Medicare Other | Source: Ambulatory Visit | Attending: Cardiovascular Disease | Admitting: Cardiovascular Disease

## 2023-05-05 DIAGNOSIS — Z952 Presence of prosthetic heart valve: Secondary | ICD-10-CM

## 2023-05-05 DIAGNOSIS — Z48812 Encounter for surgical aftercare following surgery on the circulatory system: Secondary | ICD-10-CM | POA: Diagnosis not present

## 2023-05-08 ENCOUNTER — Encounter (HOSPITAL_COMMUNITY)
Admission: RE | Admit: 2023-05-08 | Discharge: 2023-05-08 | Disposition: A | Discharge status: Home / Self Care | Payer: Medicare Other | Source: Ambulatory Visit | Attending: Cardiovascular Disease

## 2023-05-08 DIAGNOSIS — Z952 Presence of prosthetic heart valve: Secondary | ICD-10-CM

## 2023-05-08 DIAGNOSIS — Z48812 Encounter for surgical aftercare following surgery on the circulatory system: Secondary | ICD-10-CM | POA: Diagnosis not present

## 2023-05-08 NOTE — Progress Notes (Signed)
 Cardiac Individual Treatment Plan  Patient Details  Name: [name] MRN: [id number] Date of Birth: 08-21-1935 Referring Provider:   Flowsheet Row INTENSIVE CARDIAC REHAB ORIENT from 02/21/2023 in [facility]  Referring Provider Tonny Bollman, MD       Initial Encounter Date:  Flowsheet Row INTENSIVE CARDIAC REHAB ORIENT from 02/21/2023 in [facility]  Date 02/21/23       Visit Diagnosis: 01/04/23 TAVR (transcatheter aortic valve replacement)  Patient's Home Medications on Admission:  Current Outpatient Medications:    acetaminophen (TYLENOL) 500 MG tablet, Take 500 mg by mouth every 6 (six) hours as needed for headache., Disp: , Rfl:    aspirin EC 81 MG tablet, Take 1 tablet (81 mg total) by mouth daily. Swallow whole., Disp: , Rfl:    Cholecalciferol (VITAMIN D3) 250 MCG (10000 UT) capsule, Take 10,000 Units by mouth every Monday, Wednesday, and Friday., Disp: , Rfl:    diphenhydrAMINE (BENADRYL ALLERGY) 25 MG tablet, Take 12.5 mg by mouth 2 (two) times daily., Disp: , Rfl:    diphenhydrAMINE (BENADRYL) 50 MG/ML injection, 50 mg Injection with infusions, Disp: , Rfl:    estrogens, conjugated, (PREMARIN) 0.3 MG tablet, Take 0.3 mg by mouth daily with breakfast. , Disp: , Rfl:    folic acid (FOLVITE) 1 MG tablet, Take 1 mg by mouth daily with breakfast., Disp: , Rfl:    furosemide (LASIX) 20 MG tablet, Take 1 tablet (20 mg total) by mouth daily., Disp: , Rfl:    methotrexate (RHEUMATREX) 2.5 MG tablet, Take 7.5 mg by mouth 2 (two) times a week. Caution:Chemotherapy. Protect from light. On Saturdays and Sundays, Disp: , Rfl:    Multiple Vitamins-Minerals (PRESERVISION AREDS 2 PO), Take 1 capsule by mouth 2 (two) times daily., Disp: , Rfl:    OVER THE COUNTER MEDICATION, Take 1 tablet by mouth daily. [employer] Osteo supplement, Disp: , Rfl:    Propylene Glycol  (SYSTANE BALANCE OP), Place 1 drop into both eyes 2 (two) times daily as needed (dry eyes)., Disp: , Rfl:    Psyllium (METAMUCIL 3 IN 1 DAILY FIBER PO), Take 1-2 capsules by mouth daily., Disp: , Rfl:    RESTASIS 0.05 % ophthalmic emulsion, Place 1 drop into both eyes 2 (two) times daily., Disp: , Rfl:    rosuvastatin (CRESTOR) 20 MG tablet, Take 20 mg by mouth daily., Disp: , Rfl:    saccharomyces boulardii (FLORASTOR) 250 MG capsule, Take 250 mg by mouth every other day., Disp: , Rfl:    tocilizumab (ACTEMRA) 400 MG/20ML SOLN injection, Inject into the vein every 30 (thirty) days., Disp: , Rfl:    traMADol (ULTRAM) 50 MG tablet, Take 1-2 tablets (50-100 mg total) by mouth every 6 (six) hours as needed. (Patient taking differently: Take 25 mg by mouth daily.), Disp: 80 tablet, Rfl: 1   vitamin C (ASCORBIC ACID) 500 MG tablet, Take 500 mg by mouth every other day., Disp: , Rfl:   Past Medical History: Past Medical History:  Diagnosis Date   GERD (gastroesophageal reflux disease)    Hyperlipidemia    Hyperthyroidism    Hypothyroidism    Macular degeneration    Morbid obesity (HCC)    Osteoporosis    PONV (postoperative nausea and vomiting)    none recently   S/P TAVR (transcatheter aortic valve replacement) 01/03/2023   s/p TAVR with a 26 mm Medtronic FX via the  TF approach by Dr. Excell Seltzer & [facility]   Severe aortic stenosis    Shingles 04/21/2014   seen by Dr Waynard Edwards and treated with Valtrex    Tobacco Use: Social History   Tobacco Use  Smoking Status Former   Current packs/day: 0.00   Average packs/day: 0.5 packs/day for 15.0 years (7.5 ttl pk-yrs)   Types: Cigarettes   Start date: 02/07/1990   Quit date: 02/07/2005   Years since quitting: 18.2  Smokeless Tobacco Never    Labs: Review Flowsheet       Latest Ref Rng & Units 09/15/2020 01/03/2023  Labs for ITP Cardiac and Pulmonary Rehab  Cholestrol 100 - 199 mg/dL 696  -  LDL (calc) 0 - 99 mg/dL 73  -  HDL-C >29 mg/dL 59  -   Trlycerides 0 - 149 mg/dL 528  -  TCO2 22 - 32 mmol/L - 28     Capillary Blood Glucose: Lab Results  Component Value Date   GLUCAP 101 (H) 01/04/2023   GLUCAP 129 (H) 01/03/2023   GLUCAP 87 01/03/2023   GLUCAP 89 12/26/2022     Exercise Target Goals: Exercise Program Goal: Individual exercise prescription set using results from initial 6 min walk test and THRR while considering  patient's activity barriers and safety.   Exercise Prescription Goal: Initial exercise prescription builds to 30-45 minutes a day of aerobic activity, 2-3 days per week.  Home exercise guidelines will be given to patient during program as part of exercise prescription that the participant will acknowledge.  Activity Barriers & Risk Stratification:  Activity Barriers & Cardiac Risk Stratification - 02/21/23 1426       Activity Barriers & Cardiac Risk Stratification   Activity Barriers Balance Concerns;Assistive Device;Left Knee Replacement;Right Knee Replacement;Other (comment);Arthritis    Comments Multiple hand surgeries, bilateral wrist bone removal.    Cardiac Risk Stratification High             6 Minute Walk:  6 Minute Walk     Row Name 02/21/23 1533         6 Minute Walk   Phase Initial     Distance 240 feet     Walk Time 6 minutes     # of Rest Breaks 0     MPH 0.45     METS 1.35     RPE 11     Perceived Dyspnea  0     Symptoms No     Resting HR 65 bpm     Resting BP 112/70     Resting Oxygen Saturation  94 %     Exercise Oxygen Saturation  during 6 min walk 93 %     Max Ex. HR 83 bpm     Max Ex. BP 160/82     2 Minute Post BP 158/74              Oxygen Initial Assessment:   Oxygen Re-Evaluation:   Oxygen Discharge (Final Oxygen Re-Evaluation):   Initial Exercise Prescription:  Initial Exercise Prescription - 02/21/23 1500       Date of Initial Exercise RX and Referring Provider   Date 02/21/23    Referring Provider Tonny Bollman, MD    Expected  Discharge Date 06/09/23      Recumbant Bike   Level 1    Watts 1    Minutes 15    METs 1.4      NuStep   Level 2    SPM 70  Minutes 15    METs 1.3      Prescription Details   Frequency (times per week) 3    Duration Progress to 30 minutes of continuous aerobic without signs/symptoms of physical distress      Intensity   THRR 40-80% of Max Heartrate 54-108    Ratings of Perceived Exertion 11-13    Perceived Dyspnea 0-4      Progression   Progression Continue to progress workloads to maintain intensity without signs/symptoms of physical distress.      Resistance Training   Training Prescription Yes    Weight 1 lb    Reps 10-15             Perform Capillary Blood Glucose checks as needed.  Exercise Prescription Changes:   Exercise Prescription Changes     Row Name 02/27/23 1032 03/13/23 1027 03/20/23 1024 04/10/23 1025 04/28/23 1017     Response to Exercise   Blood Pressure (Admit) 108/68 130/54 122/70 122/68 114/78   Blood Pressure (Exercise) 142/72 -- 136/68 -- --   Blood Pressure (Exit) 112/64 104/68 122/70 112/70 120/72   Heart Rate (Admit) 67 bpm 77 bpm 85 bpm 82 bpm 89 bpm   Heart Rate (Exercise) 85 bpm 91 bpm 96 bpm 114 bpm 84 bpm   Heart Rate (Exit) 71 bpm 82 bpm 75 bpm 78 bpm 66 bpm   Rating of Perceived Exertion (Exercise) 10 13 11 8 8    Symptoms None None None None None   Comments Off to a fair start with exercise. -- Reviewed home exercise guidelines and goals with [name]. -- --   Duration Progress to 30 minutes of  aerobic without signs/symptoms of physical distress Progress to 30 minutes of  aerobic without signs/symptoms of physical distress Continue with 30 min of aerobic exercise without signs/symptoms of physical distress. Progress to 30 minutes of  aerobic without signs/symptoms of physical distress Progress to 30 minutes of  aerobic without signs/symptoms of physical distress   Intensity THRR unchanged THRR unchanged THRR unchanged THRR  unchanged THRR unchanged     Progression   Progression Continue to progress workloads to maintain intensity without signs/symptoms of physical distress. Continue to progress workloads to maintain intensity without signs/symptoms of physical distress. Continue to progress workloads to maintain intensity without signs/symptoms of physical distress. Continue to progress workloads to maintain intensity without signs/symptoms of physical distress. Continue to progress workloads to maintain intensity without signs/symptoms of physical distress.   Average METs 1.5 1.6 1.6 1.5 1.5     Resistance Training   Training Prescription Yes Yes Yes Yes Yes   Weight 1 lb 1 lb 1 lb 1 lb 2 lbs   Reps 10-15 10-15 10-15 10-15 10-15   Time 10 Minutes 10 Minutes 10 Minutes 10 Minutes 10 Minutes     Interval Training   Interval Training No No No No No     Recumbant Bike   Level -- 1 1 -- --   Watts -- 10 6 -- --   Minutes -- 15 15 -- --   METs -- 1.7 1.6 -- --     NuStep   Level 2 2 2 2 3    SPM 60 80 74 76 77   Minutes 15 15 15 28 28    METs 1.5 1.5 1.6 1.5 1.5     Home Exercise Plan   Plans to continue exercise at -- -- Home (comment)  Recumbent bike, walking Home (comment)  Recumbent bike, walking Home (comment)  Recumbent bike, walking   Frequency -- -- Add 2 additional days to program exercise sessions. Add 2 additional days to program exercise sessions. Add 2 additional days to program exercise sessions.   Initial Home Exercises Provided -- -- 03/20/23 03/20/23 03/20/23    Row Name 05/08/23 1016             Response to Exercise   Blood Pressure (Admit) 116/64       Blood Pressure (Exit) 117/74       Heart Rate (Admit) 85 bpm       Heart Rate (Exercise) 87 bpm       Heart Rate (Exit) 72 bpm       Rating of Perceived Exertion (Exercise) 8       Symptoms None       Duration Progress to 30 minutes of  aerobic without signs/symptoms of physical distress       Intensity THRR unchanged          Progression   Progression Continue to progress workloads to maintain intensity without signs/symptoms of physical distress.       Average METs 1.5         Resistance Training   Training Prescription Yes       Weight 2 lbs       Reps 10-15       Time 10 Minutes         Interval Training   Interval Training No         NuStep   Level 3       SPM 77       Minutes 25       METs 1.5         Home Exercise Plan   Plans to continue exercise at Home (comment)  Recumbent bike, walking       Frequency Add 2 additional days to program exercise sessions.       Initial Home Exercises Provided 03/20/23                Exercise Comments:   Exercise Comments     Row Name 02/27/23 1131 03/20/23 1113 04/19/23 1112 05/05/23 1318     Exercise Comments [name] tolerated first session of exercise fair without symptoms. She completed one station then had to use the restroom. She completed the warm-up and cool-down stretches with seated modification as needed. Reviewd home exercise guidelines and goals with [name]. Reviewed goals with [name]. Reviewed goals with [name].             Exercise Goals and Review:   Exercise Goals     Row Name 02/21/23 1326             Exercise Goals   Increase Physical Activity Yes       Intervention Provide advice, education, support and counseling about physical activity/exercise needs.;Develop an individualized exercise prescription for aerobic and resistive training based on initial evaluation findings, risk stratification, comorbidities and participant's personal goals.       Expected Outcomes Short Term: Attend rehab on a regular basis to increase amount of physical activity.;Long Term: Add in home exercise to make exercise part of routine and to increase amount of physical activity.;Long Term: Exercising regularly at least 3-5 days a week.       Increase Strength and Stamina Yes       Intervention Provide advice, education, support and counseling about  physical activity/exercise needs.;Develop an individualized exercise prescription for aerobic and resistive training  based on initial evaluation findings, risk stratification, comorbidities and participant's personal goals.       Expected Outcomes Short Term: Increase workloads from initial exercise prescription for resistance, speed, and METs.;Short Term: Perform resistance training exercises routinely during rehab and add in resistance training at home;Long Term: Improve cardiorespiratory fitness, muscular endurance and strength as measured by increased METs and functional capacity ( )       Able to understand and use rate of perceived exertion (RPE) scale Yes       Intervention Provide education and explanation on how to use RPE scale       Expected Outcomes Short Term: Able to use RPE daily in rehab to express subjective intensity level;Long Term:  Able to use RPE to guide intensity level when exercising independently       Knowledge and understanding of Target Heart Rate Range (THRR) Yes       Intervention Provide education and explanation of THRR including how the numbers were predicted and where they are located for reference       Expected Outcomes Short Term: Able to state/look up THRR;Long Term: Able to use THRR to govern intensity when exercising independently;Short Term: Able to use daily as guideline for intensity in rehab       Understanding of Exercise Prescription Yes       Intervention Provide education, explanation, and written materials on patient's individual exercise prescription       Expected Outcomes Short Term: Able to explain program exercise prescription;Long Term: Able to explain home exercise prescription to exercise independently                Exercise Goals Re-Evaluation :  Exercise Goals Re-Evaluation     Row Name 02/27/23 1538 03/20/23 1113 04/19/23 1112 05/05/23 1050       Exercise Goal Re-Evaluation   Exercise Goals Review Increase Physical  Activity;Increase Strength and Stamina;Able to understand and use rate of perceived exertion (RPE) scale Increase Physical Activity;Increase Strength and Stamina;Able to understand and use rate of perceived exertion (RPE) scale;Able to check pulse independently;Knowledge and understanding of Target Heart Rate Range (THRR);Understanding of Exercise Prescription Increase Physical Activity;Increase Strength and Stamina;Able to understand and use rate of perceived exertion (RPE) scale;Able to check pulse independently;Knowledge and understanding of Target Heart Rate Range (THRR);Understanding of Exercise Prescription Increase Physical Activity;Increase Strength and Stamina;Able to understand and use rate of perceived exertion (RPE) scale;Able to check pulse independently;Knowledge and understanding of Target Heart Rate Range (THRR);Understanding of Exercise Prescription    Comments [name] was able to understand and use RPE scale appropriately. She completed one station then had to use the restroom. Reviewed exercise prescription with [name]. She is riding her recumbent bike 15 minutes and walking indoors 15 minutes 2 days/week as her mode of home exercise. She has an Scientist, physiological that she can use to monitor her pulse. Dayzee feels good about her current exercise routine. She feels the exercise is helping her move more. She is limited by arthritis but continues to make gradual progress. Yamila plans to continue exercise riding her recumbent bike and walking at home upon completion of the cardiac rehab program.    Expected Outcomes Progress duration as tolerated to achieve 30 minutes of aerobic exericse in addition to the warm-up and cool-down. Kaaliyah will exercise 30 minutes, 2 days/week in addition to exercise at cardiac rehab to achieve 150 minutes of aerobic exercise per week. Continue to progress workloads as tolerated to help with mobility. Continue home exercise  routine in addition to exercise at cardiac rehab.              Discharge Exercise Prescription (Final Exercise Prescription Changes):  Exercise Prescription Changes - 05/08/23 1016       Response to Exercise   Blood Pressure (Admit) 116/64    Blood Pressure (Exit) 117/74    Heart Rate (Admit) 85 bpm    Heart Rate (Exercise) 87 bpm    Heart Rate (Exit) 72 bpm    Rating of Perceived Exertion (Exercise) 8    Symptoms None    Duration Progress to 30 minutes of  aerobic without signs/symptoms of physical distress    Intensity THRR unchanged      Progression   Progression Continue to progress workloads to maintain intensity without signs/symptoms of physical distress.    Average METs 1.5      Resistance Training   Training Prescription Yes    Weight 2 lbs    Reps 10-15    Time 10 Minutes      Interval Training   Interval Training No      NuStep   Level 3    SPM 77    Minutes 25    METs 1.5      Home Exercise Plan   Plans to continue exercise at Home (comment)   Recumbent bike, walking   Frequency Add 2 additional days to program exercise sessions.    Initial Home Exercises Provided 03/20/23             Nutrition:  Target Goals: Understanding of nutrition guidelines, daily intake of sodium 1500mg , cholesterol 200mg , calories 30% from fat and 7% or less from saturated fats, daily to have 5 or more servings of fruits and vegetables.  Biometrics:  Pre Biometrics - 02/21/23 1313       Pre Biometrics   Waist Circumference 48 inches    Hip Circumference 51 inches    Waist to Hip Ratio 0.94 %    Triceps Skinfold 29 mm    % Body Fat 50.2 %    Grip Strength 12 kg    Flexibility --   Not performed. Bilateral knee replacement.   Single Leg Stand --   Not performed, balance concerns.             Nutrition Therapy Plan and Nutrition Goals:  Nutrition Therapy & Goals - 05/05/23 1041       Nutrition Therapy   Diet Heart healthy diet    Drug/Food Interactions Statins/Certain Fruits      Personal Nutrition  Goals   Nutrition Goal Patient to identify strategies for reducing cardiovascular risk by attending the [facility] education and nutrition series weekly.   goal in action.   Personal Goal #2 Patient to improve diet quality by using the plate method as a guide for meal planning to include lean protein/plant protein, fruits, vegetables, whole grains, nonfat dairy as part of a well-balanced diet.   goal in action.   Comments Goals in progress. [name] has medical history of TAVR, hyperlipidemia. She continues to attend the [facility] education and nutrition series regularly. Lipids at goal. She has maintained her weight since starting with our program. Patient will benefit from participation in intensive cardiac rehab for nutrition, exercise, and lifestyle modification.      Intervention Plan   Intervention Prescribe, educate and counsel regarding individualized specific dietary modifications aiming towards targeted core components such as weight, hypertension, lipid management, diabetes, heart failure and other comorbidities.;Nutrition handout(s) given to  patient.    Expected Outcomes Short Term Goal: Understand basic principles of dietary content, such as calories, fat, sodium, cholesterol and nutrients.;Long Term Goal: Adherence to prescribed nutrition plan.             Nutrition Assessments:  MEDIFICTS Score Key: >=70 Need to make dietary changes  40-70 Heart Healthy Diet <= 40 Therapeutic Level Cholesterol Diet   Flowsheet Row INTENSIVE CARDIAC REHAB from 03/20/2023 in [facility], Vascular, & Lung [facility]  Picture Your Plate Total Score on Admission 76      Picture Your Plate Scores: <13 Unhealthy dietary pattern with much room for improvement. 41-50 Dietary pattern unlikely to meet recommendations for good health and room for improvement. 51-60 More healthful dietary pattern, with some room for improvement.  >60 Healthy dietary pattern, although there may  be some specific behaviors that could be improved.    Nutrition Goals Re-Evaluation:  Nutrition Goals Re-Evaluation     Row Name 04/06/23 1043 05/05/23 1041           Goals   Current Weight 208 lb 8.9 oz (94.6 kg) 207 lb 14.3 oz (94.3 kg)      Comment LDL 69, A1c WNL no new labs; most recent labs LDL 69, A1c WNL      Expected Outcome Goals in progress. [name] has medical history of TAVR, hyperlipidemia. She continues to attend the [facility] education and nutrition series regularly. Lipids at goal. Patient will benefit from participation in intensive cardiac rehab for nutrition, exercise, and lifestyle modification. Goals in progress. [name] has medical history of TAVR, hyperlipidemia. She continues to attend the [facility] education and nutrition series regularly. Lipids at goal. She has maintained her weight since starting with our program. Patient will benefit from participation in intensive cardiac rehab for nutrition, exercise, and lifestyle modification.               Nutrition Goals Re-Evaluation:  Nutrition Goals Re-Evaluation     Row Name 04/06/23 1043 05/05/23 1041           Goals   Current Weight 208 lb 8.9 oz (94.6 kg) 207 lb 14.3 oz (94.3 kg)      Comment LDL 69, A1c WNL no new labs; most recent labs LDL 69, A1c WNL      Expected Outcome Goals in progress. [name] has medical history of TAVR, hyperlipidemia. She continues to attend the [facility] education and nutrition series regularly. Lipids at goal. Patient will benefit from participation in intensive cardiac rehab for nutrition, exercise, and lifestyle modification. Goals in progress. [name] has medical history of TAVR, hyperlipidemia. She continues to attend the [facility] education and nutrition series regularly. Lipids at goal. She has maintained her weight since starting with our program. Patient will benefit from participation in intensive cardiac rehab for nutrition, exercise, and lifestyle modification.                Nutrition Goals Discharge (Final Nutrition Goals Re-Evaluation):  Nutrition Goals Re-Evaluation - 05/05/23 1041       Goals   Current Weight 207 lb 14.3 oz (94.3 kg)    Comment no new labs; most recent labs LDL 69, A1c WNL    Expected Outcome Goals in progress. [name] has medical history of TAVR, hyperlipidemia. She continues to attend the [facility] education and nutrition series regularly. Lipids at goal. She has maintained her weight since starting with our program. Patient will benefit from participation in intensive cardiac rehab for nutrition, exercise,  and lifestyle modification.             Psychosocial: Target Goals: Acknowledge presence or absence of significant depression and/or stress, maximize coping skills, provide positive support system. Participant is able to verbalize types and ability to use techniques and skills needed for reducing stress and depression.  Initial Review & Psychosocial Screening:  Initial Psych Review & Screening - 02/21/23 1437       Family Dynamics   Good Support System? Yes   [name] lives alone. [name] has her 3 sons and friends who live in the area for support     Barriers   Psychosocial barriers to participate in program There are no identifiable barriers or psychosocial needs.      Screening Interventions   Interventions Encouraged to exercise             Quality of Life Scores:  Quality of Life - 02/21/23 1344       Quality of Life   Select Quality of Life      Quality of Life Scores   Health/Function Pre 21.54 %    Socioeconomic Pre 25.83 %    Psych/Spiritual Pre 23.57 %    Family Pre 28.5 %    GLOBAL Pre 23.73 %            Scores of 19 and below usually indicate a poorer quality of life in these areas.  A difference of  2-3 points is a clinically meaningful difference.  A difference of 2-3 points in the total score of the Quality of Life Index has been associated with significant improvement in overall quality of  life, self-image, physical symptoms, and general health in studies assessing change in quality of life.  PHQ-9: Review Flowsheet       02/21/2023  Depression screen PHQ 2/9  Decreased Interest 0  Down, Depressed, Hopeless 0  PHQ - 2 Score 0  Altered sleeping 0  Tired, decreased energy 0  Change in appetite 0  Feeling bad or failure about yourself  0  Trouble concentrating 0  Moving slowly or fidgety/restless 0  Suicidal thoughts 0  PHQ-9 Score 0  Difficult doing work/chores Not difficult at all   Interpretation of Total Score  Total Score Depression Severity:  1-4 = Minimal depression, 5-9 = Mild depression, 10-14 = Moderate depression, 15-19 = Moderately severe depression, 20-27 = Severe depression   Psychosocial Evaluation and Intervention:   Psychosocial Re-Evaluation:  Psychosocial Re-Evaluation     Row Name 02/28/23 1503 03/09/23 1424 04/10/23 1044 05/09/23 0808       Psychosocial Re-Evaluation   Current issues with None Identified None Identified None Identified None Identified    Comments [name] did not voice any concerns or stressors on her first day of exercise [name] has  not voiced any increased  concerns or stressors during  exercise at cardiac rehab. [name] continues not to  voice any increased  concerns or stressors during  exercise at cardiac rehab. [name] continues not to  voice any increased  concerns or stressors during  exercise at cardiac rehab. [name] will complete cardiac rehab on 05/19/23    Interventions Encouraged to attend Cardiac Rehabilitation for the exercise Encouraged to attend Cardiac Rehabilitation for the exercise Encouraged to attend Cardiac Rehabilitation for the exercise Encouraged to attend Cardiac Rehabilitation for the exercise    Continue Psychosocial Services  No Follow up required No Follow up required No Follow up required No Follow up required  Psychosocial Discharge (Final Psychosocial Re-Evaluation):  Psychosocial  Re-Evaluation - 05/09/23 0808       Psychosocial Re-Evaluation   Current issues with None Identified    Comments Lanessa continues not to  voice any increased  concerns or stressors during  exercise at cardiac rehab. Tamar will complete cardiac rehab on 05/19/23    Interventions Encouraged to attend Cardiac Rehabilitation for the exercise    Continue Psychosocial Services  No Follow up required             Vocational Rehabilitation: Provide vocational rehab assistance to qualifying candidates.   Vocational Rehab Evaluation & Intervention:  Vocational Rehab - 02/21/23 1438       Initial Vocational Rehab Evaluation & Intervention   Assessment shows need for Vocational Rehabilitation No   [name] is retired and does not need vocational rehab at this time            Education: Education Goals: Education classes will be provided on a weekly basis, covering required topics. Participant will state understanding/return demonstration of topics presented.    Education     Row Name 02/27/23 1300     Education   Cardiac Education Topics Pritikin   Geographical information systems officer Psychosocial   Psychosocial Workshop Healthy Sleep for a Healthy Heart   Instruction Review Code 1- Verbalizes Understanding   Class Start Time 1155   Class Stop Time 1245   Class Time Calculation (min) 50 min    Row Name 03/03/23 1200     Education   Cardiac Education Topics Pritikin   Nurse, children's Exercise Physiologist   Select Psychosocial   Psychosocial How Our Thoughts Can Heal Our Hearts   Instruction Review Code 1- Verbalizes Understanding   Class Start Time 1152   Class Stop Time 1226   Class Time Calculation (min) 34 min    Row Name 03/06/23 1300     Education   Cardiac Education Topics Pritikin   Select Workshops     Workshops   Educator Exercise Physiologist   Select Exercise   Exercise Workshop  Managing Heart Disease: Your Path to a Healthier Heart   Instruction Review Code 1- Verbalizes Understanding   Class Start Time 1142   Class Stop Time 1238   Class Time Calculation (min) 56 min    Row Name 03/08/23 1500     Education   Cardiac Education Topics Pritikin   Orthoptist   Educator Dietitian   Weekly Topic Simple Sides and Sauces   Instruction Review Code 1- Verbalizes Understanding   Class Start Time 1145   Class Stop Time 1226   Class Time Calculation (min) 41 min    Row Name 03/10/23 1200     Education   Cardiac Education Topics Pritikin   Hospital doctor Education   General Education Hypertension and Heart Disease   Instruction Review Code 1- Verbalizes Understanding   Class Start Time 1148   Class Stop Time 1225   Class Time Calculation (min) 37 min    Row Name 03/13/23 1100     Education   Cardiac Education Topics Pritikin   Geographical information systems officer Psychosocial   Psychosocial Workshop From  Head to Heart: The Power of a Healthy Outlook   Instruction Review Code 1- Verbalizes Understanding   Class Start Time 1148   Class Stop Time 1235   Class Time Calculation (min) 47 min    Row Name 03/24/23 1500     Education   Cardiac Education Topics Pritikin   Select Core Videos     Core Videos   Educator Dietitian   Select Nutrition   Nutrition Overview of the Pritikin Eating Plan   Instruction Review Code 1- Verbalizes Understanding   Class Start Time 1145   Class Stop Time 1238   Class Time Calculation (min) 53 min    Row Name 03/27/23 1100     Education   Cardiac Education Topics Pritikin   Nurse, children's Exercise Physiologist   Select Psychosocial   Psychosocial Healthy Minds, Bodies, Hearts   Instruction Review Code 1- Verbalizes Understanding   Class  Start Time 1155   Class Stop Time 1240   Class Time Calculation (min) 45 min    Row Name 03/31/23 1200     Education   Cardiac Education Topics Pritikin   Nurse, children's   Educator Dietitian   Select Nutrition   Nutrition Other  Label Reading   Instruction Review Code 1- Verbalizes Understanding   Class Start Time 1152   Class Stop Time 1233   Class Time Calculation (min) 41 min    Row Name 04/03/23 1100     Education   Cardiac Education Topics Pritikin   Select Core Videos     Core Videos   Educator Nurse   Select Nutrition   Nutrition Becoming a Pritikin Chef   Instruction Review Code 1- Verbalizes Understanding   Class Start Time 1150   Class Stop Time 1226   Class Time Calculation (min) 36 min    Row Name 04/05/23 1300     Education   Cardiac Education Topics Pritikin   Secondary school teacher School   Educator Dietitian;Nurse   Weekly Topic Personalizing Your Pritikin Plate   Instruction Review Code 1- Verbalizes Understanding   Class Start Time 1149   Class Stop Time 1233   Class Time Calculation (min) 44 min    Row Name 04/07/23 1400     Education   Cardiac Education Topics Pritikin   Glass blower/designer Nutrition   Nutrition Workshop Label Reading   Instruction Review Code 1- Verbalizes Understanding   Class Start Time 1145   Class Stop Time 1230   Class Time Calculation (min) 45 min    Row Name 04/10/23 1100     Education   Cardiac Education Topics Pritikin   Select Workshops     Workshops   Educator Exercise Physiologist   Select Psychosocial   Psychosocial Workshop Recognizing and Reducing Stress   Instruction Review Code 1- Verbalizes Understanding   Class Start Time 1145   Class Stop Time 1228   Class Time Calculation (min) 43 min    Row Name 04/14/23 1300     Education   Cardiac Education Topics Pritikin   Geologist, engineering Nutrition   Nutrition Calorie Density   Instruction Review Code 1- Verbalizes Understanding   Class Start Time 1145   Class Stop  Time 1220   Class Time Calculation (min) 35 min    Row Name 04/17/23 1300     Education   Cardiac Education Topics Pritikin   Geographical information systems officer Exercise   Exercise Workshop Exercise Basics: Building Your Action Plan   Instruction Review Code 1- Verbalizes Understanding   Class Start Time 1146   Class Stop Time 1235   Class Time Calculation (min) 49 min    Row Name 04/19/23 1600     Education   Cardiac Education Topics Pritikin   Customer service manager   Weekly Topic Efficiency Cooking - Meals in a Snap   Instruction Review Code 1- Verbalizes Understanding   Class Start Time 1145   Class Stop Time 1225   Class Time Calculation (min) 40 min    Row Name 04/21/23 1200     Education   Cardiac Education Topics Pritikin   Psychologist, forensic Exercise Education   Exercise Education Move It!   Instruction Review Code 1- Verbalizes Understanding   Class Start Time 1146   Class Stop Time 1222   Class Time Calculation (min) 36 min    Row Name 04/24/23 1500     Education   Cardiac Education Topics Pritikin   Glass blower/designer Nutrition   Nutrition Workshop Targeting Your Nutrition Priorities   Instruction Review Code 1- Verbalizes Understanding   Class Start Time 1150   Class Stop Time 1222   Class Time Calculation (min) 32 min    Row Name 04/26/23 1500     Education   Cardiac Education Topics Pritikin   Orthoptist   Educator Dietitian   Weekly Topic One-Pot Wonders   Instruction Review Code 1- Verbalizes Understanding   Class Start Time 1145   Class Stop Time 1227   Class Time  Calculation (min) 42 min    Row Name 04/28/23 1100     Education   Cardiac Education Topics Pritikin   Psychologist, forensic General Education   General Education Hypertension and Heart Disease   Instruction Review Code 1- Verbalizes Understanding   Class Start Time 1144   Class Stop Time 1217   Class Time Calculation (min) 33 min    Row Name 05/01/23 1100     Education   Cardiac Education Topics Pritikin   Select Workshops     Workshops   Educator Exercise Physiologist   Select Psychosocial   Psychosocial Workshop Focused Goals, Sustainable Changes   Instruction Review Code 1- Verbalizes Understanding   Class Start Time 1144   Class Stop Time 1218   Class Time Calculation (min) 34 min    Row Name 05/03/23 1300     Education   Cardiac Education Topics Pritikin   Orthoptist   Educator Dietitian   Weekly Topic Comforting Weekend Breakfasts   Instruction Review Code 1- Verbalizes Understanding   Class Start Time 1145   Class Stop Time 1228   Class Time Calculation (min) 43 min    Row Name 05/05/23 1100     Education   Cardiac Education Topics  Pritikin   Licensed conveyancer Nutrition   Nutrition Dining Out - Part 1   Instruction Review Code 1- Verbalizes Understanding   Class Start Time 1145   Class Stop Time 1222   Class Time Calculation (min) 37 min    Row Name 05/08/23 1100     Education   Cardiac Education Topics Pritikin   Select Core Videos     Core Videos   Educator Exercise Physiologist   Select Exercise Education   Exercise Education Biomechanial Limitations   Instruction Review Code 1- Verbalizes Understanding   Class Start Time 1145   Class Stop Time 1218   Class Time Calculation (min) 33 min            Core Videos: Exercise    Move It!  Clinical staff conducted group or individual video education  with verbal and written material and guidebook.  Patient learns the recommended Pritikin exercise program. Exercise with the goal of living a long, healthy life. Some of the health benefits of exercise include controlled diabetes, healthier blood pressure levels, improved cholesterol levels, improved heart and lung capacity, improved sleep, and better body composition. Everyone should speak with their doctor before starting or changing an exercise routine.  Biomechanical Limitations Clinical staff conducted group or individual video education with verbal and written material and guidebook.  Patient learns how biomechanical limitations can impact exercise and how we can mitigate and possibly overcome limitations to have an impactful and balanced exercise routine.  Body Composition Clinical staff conducted group or individual video education with verbal and written material and guidebook.  Patient learns that body composition (ratio of muscle mass to fat mass) is a key component to assessing overall fitness, rather than body weight alone. Increased fat mass, especially visceral belly fat, can put [place] at increased risk for metabolic syndrome, type 2 diabetes, heart disease, and even death. It is recommended to combine diet and exercise (cardiovascular and resistance training) to improve your body composition. Seek guidance from your physician and exercise physiologist before implementing an exercise routine.  Exercise Action Plan Clinical staff conducted group or individual video education with verbal and written material and guidebook.  Patient learns the recommended strategies to achieve and enjoy long-term exercise adherence, including variety, self-motivation, self-efficacy, and positive decision making. Benefits of exercise include fitness, good health, weight management, more energy, better sleep, less stress, and overall well-being.  Medical   Heart Disease Risk Reduction Clinical staff conducted  group or individual video education with verbal and written material and guidebook.  Patient learns our heart is our most vital organ as it circulates oxygen, nutrients, white blood cells, and hormones throughout the entire body, and carries waste away. Data supports a plant-based eating plan like the Pritikin Program for its effectiveness in slowing progression of and reversing heart disease. The video provides a number of recommendations to address heart disease.   Metabolic Syndrome and Belly Fat  Clinical staff conducted group or individual video education with verbal and written material and guidebook.  Patient learns what metabolic syndrome is, how it leads to heart disease, and how one can reverse it and keep it from coming back. You have metabolic syndrome if you have 3 of the following 5 criteria: abdominal obesity, high blood pressure, high triglycerides, low HDL cholesterol, and high blood sugar.  Hypertension and Heart Disease Clinical staff conducted group or individual video education with verbal and written  material and guidebook.  Patient learns that high blood pressure, or hypertension, is very common in the [place]. Hypertension is largely due to excessive salt intake, but other important risk factors include being overweight, physical inactivity, drinking too much alcohol, smoking, and not eating enough potassium from fruits and vegetables. High blood pressure is a leading risk factor for heart attack, stroke, congestive heart failure, dementia, kidney failure, and premature death. Long-term effects of excessive salt intake include stiffening of the arteries and thickening of heart muscle and organ damage. Recommendations include ways to reduce hypertension and the risk of heart disease.  Diseases of Our Time - Focusing on Diabetes Clinical staff conducted group or individual video education with verbal and written material and guidebook.  Patient learns why the best way to stop  diseases of our time is prevention, through food and other lifestyle changes. Medicine (such as prescription pills and surgeries) is often only a Band-Aid on the problem, not a long-term solution. Most common diseases of our time include obesity, type 2 diabetes, hypertension, heart disease, and cancer. The Pritikin Program is recommended and has been proven to help reduce, reverse, and/or prevent the damaging effects of metabolic syndrome.  Nutrition   Overview of the Pritikin Eating Plan  Clinical staff conducted group or individual video education with verbal and written material and guidebook.  Patient learns about the Pritikin Eating Plan for disease risk reduction. The Pritikin Eating Plan emphasizes a wide variety of unrefined, minimally-processed carbohydrates, like fruits, vegetables, whole grains, and legumes. Go, Caution, and Stop food choices are explained. Plant-based and lean animal proteins are emphasized. Rationale provided for low sodium intake for blood pressure control, low added sugars for blood sugar stabilization, and low added fats and oils for coronary artery disease risk reduction and weight management.  Calorie Density  Clinical staff conducted group or individual video education with verbal and written material and guidebook.  Patient learns about calorie density and how it impacts the Pritikin Eating Plan. Knowing the characteristics of the food you choose will help you decide whether those foods will lead to weight gain or weight loss, and whether you want to consume more or less of them. Weight loss is usually a side effect of the Pritikin Eating Plan because of its focus on low calorie-dense foods.  Label Reading  Clinical staff conducted group or individual video education with verbal and written material and guidebook.  Patient learns about the Pritikin recommended label reading guidelines and corresponding recommendations regarding calorie density, added sugars, sodium  content, and whole grains.  Dining Out - Part 1  Clinical staff conducted group or individual video education with verbal and written material and guidebook.  Patient learns that restaurant meals can be sabotaging because they can be so high in calories, fat, sodium, and/or sugar. Patient learns recommended strategies on how to positively address this and avoid unhealthy pitfalls.  Facts on Fats  Clinical staff conducted group or individual video education with verbal and written material and guidebook.  Patient learns that lifestyle modifications can be just as effective, if not more so, as many medications for lowering your risk of heart disease. A Pritikin lifestyle can help to reduce your risk of inflammation and atherosclerosis (cholesterol build-up, or plaque, in the artery walls). Lifestyle interventions such as dietary choices and physical activity address the cause of atherosclerosis. A review of the types of fats and their impact on blood cholesterol levels, along with dietary recommendations to reduce fat intake is also  included.  Nutrition Action Plan  Clinical staff conducted group or individual video education with verbal and written material and guidebook.  Patient learns how to incorporate Pritikin recommendations into their lifestyle. Recommendations include planning and keeping personal health goals in mind as an important part of their success.  Healthy Mind-Set    Healthy Minds, Bodies, Hearts  Clinical staff conducted group or individual video education with verbal and written material and guidebook.  Patient learns how to identify when they are stressed. Video will discuss the impact of that stress, as well as the many benefits of stress management. Patient will also be introduced to stress management techniques. The way we think, act, and feel has an impact on our hearts.  How Our Thoughts Can Heal Our Hearts  Clinical staff conducted group or individual video education  with verbal and written material and guidebook.  Patient learns that negative thoughts can cause depression and anxiety. This can result in negative lifestyle behavior and serious health problems. Cognitive behavioral therapy is an effective method to help control our thoughts in order to change and improve our emotional outlook.  Additional Videos:  Exercise    Improving Performance  Clinical staff conducted group or individual video education with verbal and written material and guidebook.  Patient learns to use a non-linear approach by alternating intensity levels and lengths of time spent exercising to help burn more calories and lose more body fat. Cardiovascular exercise helps improve heart health, metabolism, hormonal balance, blood sugar control, and recovery from fatigue. Resistance training improves strength, endurance, balance, coordination, reaction time, metabolism, and muscle mass. Flexibility exercise improves circulation, posture, and balance. Seek guidance from your physician and exercise physiologist before implementing an exercise routine and learn your capabilities and proper form for all exercise.  Introduction to Yoga  Clinical staff conducted group or individual video education with verbal and written material and guidebook.  Patient learns about yoga, a discipline of the coming together of mind, breath, and body. The benefits of yoga include improved flexibility, improved range of motion, better posture and core strength, increased lung function, weight loss, and positive self-image. Yoga's heart health benefits include lowered blood pressure, healthier heart rate, decreased cholesterol and triglyceride levels, improved immune function, and reduced stress. Seek guidance from your physician and exercise physiologist before implementing an exercise routine and learn your capabilities and proper form for all exercise.  Medical   Aging: Enhancing Your Quality of Life  Clinical  staff conducted group or individual video education with verbal and written material and guidebook.  Patient learns key strategies and recommendations to stay in good physical health and enhance quality of life, such as prevention strategies, having an advocate, securing a Health Care Proxy and Power of Attorney, and keeping a list of medications and system for tracking them. It also discusses how to avoid risk for bone loss.  Biology of Weight Control  Clinical staff conducted group or individual video education with verbal and written material and guidebook.  Patient learns that weight gain occurs because we consume more calories than we burn (eating more, moving less). Even if your body weight is normal, you may have higher ratios of fat compared to muscle mass. Too much body fat puts you at increased risk for cardiovascular disease, heart attack, stroke, type 2 diabetes, and obesity-related cancers. In addition to exercise, following the Pritikin Eating Plan can help reduce your risk.  Decoding Lab Results  Clinical staff conducted group or individual video education with verbal  and written material and guidebook.  Patient learns that lab test reflects one measurement whose values change over time and are influenced by many factors, including medication, stress, sleep, exercise, food, hydration, pre-existing medical conditions, and more. It is recommended to use the knowledge from this video to become more involved with your lab results and evaluate your numbers to speak with your doctor.   Diseases of Our Time - Overview  Clinical staff conducted group or individual video education with verbal and written material and guidebook.  Patient learns that according to the CDC, 50% to 70% of chronic diseases (such as obesity, type 2 diabetes, elevated lipids, hypertension, and heart disease) are avoidable through lifestyle improvements including healthier food choices, listening to satiety cues, and  increased physical activity.  Sleep Disorders Clinical staff conducted group or individual video education with verbal and written material and guidebook.  Patient learns how good quality and duration of sleep are important to overall health and well-being. Patient also learns about sleep disorders and how they impact health along with recommendations to address them, including discussing with a physician.  Nutrition  Dining Out - Part 2 Clinical staff conducted group or individual video education with verbal and written material and guidebook.  Patient learns how to plan ahead and communicate in order to maximize their dining experience in a healthy and nutritious manner. Included are recommended food choices based on the type of restaurant the patient is visiting.   Fueling a Banker conducted group or individual video education with verbal and written material and guidebook.  There is a strong connection between our food choices and our health. Diseases like obesity and type 2 diabetes are very prevalent and are in large-part due to lifestyle choices. The Pritikin Eating Plan provides plenty of food and hunger-curbing satisfaction. It is easy to follow, affordable, and helps reduce health risks.  Menu Workshop  Clinical staff conducted group or individual video education with verbal and written material and guidebook.  Patient learns that restaurant meals can sabotage health goals because they are often packed with calories, fat, sodium, and sugar. Recommendations include strategies to plan ahead and to communicate with the manager, chef, or server to help order a healthier meal.  Planning Your Eating Strategy  Clinical staff conducted group or individual video education with verbal and written material and guidebook.  Patient learns about the Pritikin Eating Plan and its benefit of reducing the risk of disease. The Pritikin Eating Plan does not focus on calories.  Instead, it emphasizes high-quality, nutrient-rich foods. By knowing the characteristics of the foods, we choose, we can determine their calorie density and make informed decisions.  Targeting Your Nutrition Priorities  Clinical staff conducted group or individual video education with verbal and written material and guidebook.  Patient learns that lifestyle habits have a tremendous impact on disease risk and progression. This video provides eating and physical activity recommendations based on your personal health goals, such as reducing LDL cholesterol, losing weight, preventing or controlling type 2 diabetes, and reducing high blood pressure.  Vitamins and Minerals  Clinical staff conducted group or individual video education with verbal and written material and guidebook.  Patient learns different ways to obtain key vitamins and minerals, including through a recommended healthy diet. It is important to discuss all supplements you take with your doctor.   Healthy Mind-Set    Smoking Cessation  Clinical staff conducted group or individual video education with verbal and written material and guidebook.  Patient learns that cigarette smoking and tobacco addiction pose a serious health risk which affects millions of people. Stopping smoking will significantly reduce the risk of heart disease, lung disease, and many forms of cancer. Recommended strategies for quitting are covered, including working with your doctor to develop a successful plan.  Culinary   Becoming a Set designer conducted group or individual video education with verbal and written material and guidebook.  Patient learns that cooking at home can be healthy, cost-effective, quick, and puts them in control. Keys to cooking healthy recipes will include looking at your recipe, assessing your equipment needs, planning ahead, making it simple, choosing cost-effective seasonal ingredients, and limiting the use of added  fats, salts, and sugars.  Cooking - Breakfast and Snacks  Clinical staff conducted group or individual video education with verbal and written material and guidebook.  Patient learns how important breakfast is to satiety and nutrition through the entire day. Recommendations include key foods to eat during breakfast to help stabilize blood sugar levels and to prevent overeating at meals later in the day. Planning ahead is also a key component.  Cooking - Educational psychologist conducted group or individual video education with verbal and written material and guidebook.  Patient learns eating strategies to improve overall health, including an approach to cook more at home. Recommendations include thinking of animal protein as a side on your plate rather than center stage and focusing instead on lower calorie dense options like vegetables, fruits, whole grains, and plant-based proteins, such as beans. Making sauces in large quantities to freeze for later and leaving the skin on your vegetables are also recommended to maximize your experience.  Cooking - Healthy Salads and Dressing Clinical staff conducted group or individual video education with verbal and written material and guidebook.  Patient learns that vegetables, fruits, whole grains, and legumes are the foundations of the Pritikin Eating Plan. Recommendations include how to incorporate each of these in flavorful and healthy salads, and how to create homemade salad dressings. Proper handling of ingredients is also covered. Cooking - Soups and [employer] - Soups and Desserts Clinical staff conducted group or individual video education with verbal and written material and guidebook.  Patient learns that Pritikin soups and desserts make for easy, nutritious, and delicious snacks and meal components that are low in sodium, fat, sugar, and calorie density, while high in vitamins, minerals, and filling fiber. Recommendations include  simple and healthy ideas for soups and desserts.   Overview     The Pritikin Solution Program Overview Clinical staff conducted group or individual video education with verbal and written material and guidebook.  Patient learns that the results of the Pritikin Program have been documented in more than 100 articles published in peer-reviewed journals, and the benefits include reducing risk factors for (and, in some cases, even reversing) high cholesterol, high blood pressure, type 2 diabetes, obesity, and more! An overview of the three key pillars of the Pritikin Program will be covered: eating well, doing regular exercise, and having a healthy mind-set.  WORKSHOPS  Exercise: Exercise Basics: Building Your Action Plan Clinical staff led group instruction and group discussion with PowerPoint presentation and patient guidebook. To enhance the learning environment the use of posters, models and videos may be added. At the conclusion of this workshop, patients will comprehend the difference between physical activity and exercise, as well as the benefits of incorporating both, into their routine. Patients will understand the  FITT (Frequency, Intensity, Time, and Type) principle and how to use it to build an exercise action plan. In addition, safety concerns and other considerations for exercise and cardiac rehab will be addressed by the presenter. The purpose of this lesson is to promote a comprehensive and effective weekly exercise routine in order to improve patients' overall level of fitness.   Managing Heart Disease: Your Path to a Healthier Heart Clinical staff led group instruction and group discussion with PowerPoint presentation and patient guidebook. To enhance the learning environment the use of posters, models and videos may be added.At the conclusion of this workshop, patients will understand the anatomy and physiology of the heart. Additionally, they will understand how Pritikin's three  pillars impact the risk factors, the progression, and the management of heart disease.  The purpose of this lesson is to provide a high-level overview of the heart, heart disease, and how the Pritikin lifestyle positively impacts risk factors.  Exercise Biomechanics Clinical staff led group instruction and group discussion with PowerPoint presentation and patient guidebook. To enhance the learning environment the use of posters, models and videos may be added. Patients will learn how the structural parts of their bodies function and how these functions impact their daily activities, movement, and exercise. Patients will learn how to promote a neutral spine, learn how to manage pain, and identify ways to improve their physical movement in order to promote healthy living. The purpose of this lesson is to expose patients to common physical limitations that impact physical activity. Participants will learn practical ways to adapt and manage aches and pains, and to minimize their effect on regular exercise. Patients will learn how to maintain good posture while sitting, walking, and lifting.  Balance Training and Fall Prevention  Clinical staff led group instruction and group discussion with PowerPoint presentation and patient guidebook. To enhance the learning environment the use of posters, models and videos may be added. At the conclusion of this workshop, patients will understand the importance of their sensorimotor skills (vision, proprioception, and the vestibular system) in maintaining their ability to balance as they age. Patients will apply a variety of balancing exercises that are appropriate for their current level of function. Patients will understand the common causes for poor balance, possible solutions to these problems, and ways to modify their physical environment in order to minimize their fall risk. The purpose of this lesson is to teach patients about the importance of maintaining  balance as they age and ways to minimize their risk of falling.  WORKSHOPS   Nutrition:  Fueling a Ship broker led group instruction and group discussion with PowerPoint presentation and patient guidebook. To enhance the learning environment the use of posters, models and videos may be added. Patients will review the foundational principles of the Pritikin Eating Plan and understand what constitutes a serving size in each of the food groups. Patients will also learn Pritikin-friendly foods that are better choices when away from home and review make-ahead meal and snack options. Calorie density will be reviewed and applied to three nutrition priorities: weight maintenance, weight loss, and weight gain. The purpose of this lesson is to reinforce (in a group setting) the key concepts around what patients are recommended to eat and how to apply these guidelines when away from home by planning and selecting Pritikin-friendly options. Patients will understand how calorie density may be adjusted for different weight management goals.  Mindful Eating  Clinical staff led group instruction and group discussion with PowerPoint  presentation and patient guidebook. To enhance the learning environment the use of posters, models and videos may be added. Patients will briefly review the concepts of the Pritikin Eating Plan and the importance of low-calorie dense foods. The concept of mindful eating will be introduced as well as the importance of paying attention to internal hunger signals. Triggers for non-hunger eating and techniques for dealing with triggers will be explored. The purpose of this lesson is to provide patients with the opportunity to review the basic principles of the Pritikin Eating Plan, discuss the value of eating mindfully and how to measure internal cues of hunger and fullness using the Hunger Scale. Patients will also discuss reasons for non-hunger eating and learn strategies to use  for controlling emotional eating.  Targeting Your Nutrition Priorities Clinical staff led group instruction and group discussion with PowerPoint presentation and patient guidebook. To enhance the learning environment the use of posters, models and videos may be added. Patients will learn how to determine their genetic susceptibility to disease by reviewing their family history. Patients will gain insight into the importance of diet as part of an overall healthy lifestyle in mitigating the impact of genetics and other environmental insults. The purpose of this lesson is to provide patients with the opportunity to assess their personal nutrition priorities by looking at their family history, their own health history and current risk factors. Patients will also be able to discuss ways of prioritizing and modifying the Pritikin Eating Plan for their highest risk areas  Menu  Clinical staff led group instruction and group discussion with PowerPoint presentation and patient guidebook. To enhance the learning environment the use of posters, models and videos may be added. Using menus brought in from [employer], or printed from [employer], patients will apply the Pritikin dining out guidelines that were presented in the [employer] video. Patients will also be able to practice these guidelines in a variety of provided scenarios. The purpose of this lesson is to provide patients with the opportunity to practice hands-on learning of the Pritikin Dining Out guidelines with actual menus and practice scenarios.  Label Reading Clinical staff led group instruction and group discussion with PowerPoint presentation and patient guidebook. To enhance the learning environment the use of posters, models and videos may be added. Patients will review and discuss the Pritikin label reading guidelines presented in Pritikin's Label Reading Educational series video. Using fool labels brought in from local  grocery stores and markets, patients will apply the label reading guidelines and determine if the packaged food meet the Pritikin guidelines. The purpose of this lesson is to provide patients with the opportunity to review, discuss, and practice hands-on learning of the Pritikin Label Reading guidelines with actual packaged food labels. Cooking School  Pritikin's [employer] are designed to teach patients ways to prepare quick, simple, and affordable recipes at home. The importance of nutrition's role in chronic disease risk reduction is reflected in its emphasis in the overall Pritikin program. By learning how to prepare essential core Pritikin Eating Plan recipes, patients will increase control over what they eat; be able to customize the flavor of foods without the use of added salt, sugar, or fat; and improve the quality of the food they consume. By learning a set of core recipes which are easily assembled, quickly prepared, and affordable, patients are more likely to prepare more healthy foods at home. These workshops focus on convenient breakfasts, simple entres, side dishes, and desserts which can  be prepared with minimal effort and are consistent with nutrition recommendations for cardiovascular risk reduction. Cooking [employer] are taught by a Armed forces logistics/support/administrative officer (RD) who has been trained by the [employer]. The chef or RD has a clear understanding of the importance of minimizing - if not completely eliminating - added fat, sugar, and sodium in recipes. Throughout the series of Cooking School Workshop sessions, patients will learn about healthy ingredients and efficient methods of cooking to build confidence in their capability to prepare    Cooking School weekly topics:  Adding Flavor- Sodium-Free  Fast and Healthy Breakfasts  Powerhouse Plant-Based Proteins  Satisfying Salads and Dressings  Simple Sides and Sauces  International Cuisine-Spotlight on  the [employer] Zones  Delicious Desserts  Savory Soups  [employer] - Meals in a Astronomer Appetizers and Snacks  Comforting Weekend Breakfasts  One-Pot Wonders   Fast Evening Meals  Landscape architect Your Pritikin Plate  WORKSHOPS   Healthy Mindset (Psychosocial):  Focused Goals, Sustainable Changes Clinical staff led group instruction and group discussion with PowerPoint presentation and patient guidebook. To enhance the learning environment the use of posters, models and videos may be added. Patients will be able to apply effective goal setting strategies to establish at least one personal goal, and then take consistent, meaningful action toward that goal. They will learn to identify common barriers to achieving personal goals and develop strategies to overcome them. Patients will also gain an understanding of how our mind-set can impact our ability to achieve goals and the importance of cultivating a positive and growth-oriented mind-set. The purpose of this lesson is to provide patients with a deeper understanding of how to set and achieve personal goals, as well as the tools and strategies needed to overcome common obstacles which may arise along the way.  From Head to Heart: The Power of a Healthy Outlook  Clinical staff led group instruction and group discussion with PowerPoint presentation and patient guidebook. To enhance the learning environment the use of posters, models and videos may be added. Patients will be able to recognize and describe the impact of emotions and mood on physical health. They will discover the importance of self-care and explore self-care practices which may work for them. Patients will also learn how to utilize the 4 C's to cultivate a healthier outlook and better manage stress and challenges. The purpose of this lesson is to demonstrate to patients how a healthy outlook is an essential part of maintaining good health, especially as they  continue their cardiac rehab journey.  Healthy Sleep for a Healthy Heart Clinical staff led group instruction and group discussion with PowerPoint presentation and patient guidebook. To enhance the learning environment the use of posters, models and videos may be added. At the conclusion of this workshop, patients will be able to demonstrate knowledge of the importance of sleep to overall health, well-being, and quality of life. They will understand the symptoms of, and treatments for, common sleep disorders. Patients will also be able to identify daytime and nighttime behaviors which impact sleep, and they will be able to apply these tools to help manage sleep-related challenges. The purpose of this lesson is to provide patients with a general overview of sleep and outline the importance of quality sleep. Patients will learn about a few of the most common sleep disorders. Patients will also be introduced to the concept of "sleep hygiene," and discover ways to self-manage certain sleeping problems through simple  daily behavior changes. Finally, the workshop will motivate patients by clarifying the links between quality sleep and their goals of heart-healthy living.   Recognizing and Reducing Stress Clinical staff led group instruction and group discussion with PowerPoint presentation and patient guidebook. To enhance the learning environment the use of posters, models and videos may be added. At the conclusion of this workshop, patients will be able to understand the types of stress reactions, differentiate between acute and chronic stress, and recognize the impact that chronic stress has on their health. They will also be able to apply different coping mechanisms, such as reframing negative self-talk. Patients will have the opportunity to practice a variety of stress management techniques, such as deep abdominal breathing, progressive muscle relaxation, and/or guided imagery.  The purpose of this lesson is to  educate patients on the role of stress in their lives and to provide healthy techniques for coping with it.  Learning Barriers/Preferences:  Learning Barriers/Preferences - 02/21/23 1523       Learning Barriers/Preferences   Learning Barriers Sight;Exercise Concerns   wears reading glasses. Walks very slow due to multiple knee surgeries uses a rolling walker   Learning Preferences Verbal Instruction;Pictoral             Education Topics:  Knowledge Questionnaire Score:  Knowledge Questionnaire Score - 02/21/23 1344       Knowledge Questionnaire Score   Pre Score 24/24             Core Components/Risk Factors/Patient Goals at Admission:  Personal Goals and Risk Factors at Admission - 02/21/23 1344       Core Components/Risk Factors/Patient Goals on Admission    Weight Management Yes;Obesity;Weight Loss    Intervention Weight Management/Obesity: Establish reasonable short term and long term weight goals.;Obesity: Provide education and appropriate resources to help participant work on and attain dietary goals.    Expected Outcomes Short Term: Continue to assess and modify interventions until short term weight is achieved;Long Term: Adherence to nutrition and physical activity/exercise program aimed toward attainment of established weight goal;Weight Loss: Understanding of general recommendations for a balanced deficit meal plan, which promotes 1-2 lb weight loss per week and includes a negative energy balance of [phone] kcal/d    Hypertension Yes    Intervention Provide education on lifestyle modifcations including regular physical activity/exercise, weight management, moderate sodium restriction and increased consumption of fresh fruit, vegetables, and low fat dairy, alcohol moderation, and smoking cessation.;Monitor prescription use compliance.    Expected Outcomes Short Term: Continued assessment and intervention until BP is < 140/81mm HG in hypertensive participants. <  130/42mm HG in hypertensive participants with diabetes, heart failure or chronic kidney disease.;Long Term: Maintenance of blood pressure at goal levels.    Lipids Yes    Intervention Provide education and support for participant on nutrition & aerobic/resistive exercise along with prescribed medications to achieve LDL 70mg , HDL >40mg .    Expected Outcomes Short Term: Participant states understanding of desired cholesterol values and is compliant with medications prescribed. Participant is following exercise prescription and nutrition guidelines.;Long Term: Cholesterol controlled with medications as prescribed, with individualized exercise RX and with personalized nutrition plan. Value goals: LDL < 70mg , HDL > 40 mg.             Core Components/Risk Factors/Patient Goals Review:   Goals and Risk Factor Review     Row Name 02/28/23 1510 03/09/23 1425 04/10/23 1045 05/09/23 0809       Core Components/Risk Factors/Patient Goals Review  Personal Goals Review Weight Management/Obesity;Hypertension;Lipids Weight Management/Obesity;Hypertension;Lipids Weight Management/Obesity;Hypertension;Lipids Weight Management/Obesity;Hypertension;Lipids    Review Korena started cardiac rehab on 02/27/23. Lashanda did well with exercise for her fintness level as Kerstie is somewhat deconditioned. Vital signs were stable. Kalina started cardiac rehab on 02/27/23. Gertude is off to a good start to exercise for her fitness level.Laikyn is somewhat deconditioned. Vital signs have been stable. Andersyn is enjoying participating in cardiac rehab.  Vital signs remain stable.Takeysha's continues to have slow progress. Keaghan is enjoying participating in cardiac rehab.  Vital signs remain stable.Kayleanna's continues to have slow progress. Marua will complete cardiac rehab on 05/19/23.    Expected Outcomes Simona will continue to participate in cardiac rehab for exercise, nutrition and lifestyle modifications. Kegan will continue to participate  in cardiac rehab for exercise, nutrition and lifestyle modifications. Marah will continue to participate in cardiac rehab for exercise, nutrition and lifestyle modifications. Carling will continue to participate in cardiac rehab for exercise, nutrition and lifestyle modifications.             Core Components/Risk Factors/Patient Goals at Discharge (Final Review):   Goals and Risk Factor Review - 05/09/23 0809       Core Components/Risk Factors/Patient Goals Review   Personal Goals Review Weight Management/Obesity;Hypertension;Lipids    Review Khristie is enjoying participating in cardiac rehab.  Vital signs remain stable.Sanya's continues to have slow progress. Carlissa will complete cardiac rehab on 05/19/23.    Expected Outcomes Marlowe will continue to participate in cardiac rehab for exercise, nutrition and lifestyle modifications.             ITP Comments:  ITP Comments     Row Name 02/21/23 1313 02/28/23 1457 03/09/23 1423 04/10/23 1043 05/09/23 1610   ITP Comments Medical Director- Dr. Armanda Magic, MD. Introduction to the Pritikin Education Program / Intensive Cardiac Rehab. Reviewed initial orientation folder. 30 day ITP Review. Briaunna started cardiac rehab on 02/27/23. Jasper did well with exercise for her fitness level 30 day ITP Review. Nezzie started cardiac rehab on 02/27/23. Joanann is off to a good start to  exercise for her fitness level 30 day ITP Review. Dalaney started cardiac rehab on 02/27/23. Maleia has good attendance and participation with  exercise for her fitness level 30 day ITP Review.  Rindy continues to have  good attendance and participation with  exercise for her fitness level. Davonne will tenatively complete cardiac rheab on 05/19/23.            Comments: See ITP Comments

## 2023-05-09 DIAGNOSIS — M0579 Rheumatoid arthritis with rheumatoid factor of multiple sites without organ or systems involvement: Secondary | ICD-10-CM | POA: Diagnosis not present

## 2023-05-10 ENCOUNTER — Encounter (HOSPITAL_COMMUNITY)
Admission: RE | Admit: 2023-05-10 | Discharge: 2023-05-10 | Disposition: A | Discharge status: Home / Self Care | Payer: Medicare Other | Source: Ambulatory Visit | Attending: Cardiovascular Disease | Admitting: Cardiovascular Disease

## 2023-05-10 DIAGNOSIS — Z952 Presence of prosthetic heart valve: Secondary | ICD-10-CM | POA: Insufficient documentation

## 2023-05-12 ENCOUNTER — Encounter (HOSPITAL_COMMUNITY)
Admission: RE | Admit: 2023-05-12 | Discharge: 2023-05-12 | Disposition: A | Discharge status: Home / Self Care | Payer: Medicare Other | Source: Ambulatory Visit | Attending: Cardiovascular Disease | Admitting: Cardiovascular Disease

## 2023-05-12 DIAGNOSIS — Z952 Presence of prosthetic heart valve: Secondary | ICD-10-CM | POA: Diagnosis not present

## 2023-05-15 ENCOUNTER — Encounter (HOSPITAL_COMMUNITY)
Admission: RE | Admit: 2023-05-15 | Discharge: 2023-05-15 | Disposition: A | Discharge status: Home / Self Care | Payer: Medicare Other | Source: Ambulatory Visit | Attending: Cardiovascular Disease | Admitting: Cardiovascular Disease

## 2023-05-15 DIAGNOSIS — Z952 Presence of prosthetic heart valve: Secondary | ICD-10-CM | POA: Diagnosis not present

## 2023-05-15 NOTE — Progress Notes (Signed)
 Discharge Progress Report  Patient Details  Name: SHUNTEL FISHBURN MRN: [id number] Date of Birth: [birth year] Referring Provider:   Flowsheet Row INTENSIVE CARDIAC REHAB ORIENT from 02/21/2023 in [facility]  Referring Provider Arnoldo Lapping, MD        Number of Visits: 59  Reason for Discharge:  Patient reached a stable level of exercise. Patient independent in their exercise. Patient has met program and personal goals.  Smoking History:  Social History   Tobacco Use  Smoking Status Former   Current packs/day: 0.00   Average packs/day: 0.5 packs/day for 15.0 years (7.5 ttl pk-yrs)   Types: Cigarettes   Start date: 02/07/1990   Quit date: 02/07/2005   Years since quitting: 18.3  Smokeless Tobacco Never    Diagnosis:  01/04/23 TAVR (transcatheter aortic valve replacement)  ADL [facility]:   Initial Exercise Prescription:  Initial Exercise Prescription - 02/21/23 1500       Date of Initial Exercise RX and Referring Provider   Date 02/21/23    Referring Provider Arnoldo Lapping, MD    Expected Discharge Date 06/09/23      Recumbant Bike   Level 1    Watts 1    Minutes 15    METs 1.4      NuStep   Level 2    SPM 70    Minutes 15    METs 1.3      Prescription Details   Frequency (times per week) 3    Duration Progress to 30 minutes of continuous aerobic without signs/symptoms of physical distress      Intensity   THRR 40-80% of Max Heartrate 54-108    Ratings of Perceived Exertion 11-13    Perceived Dyspnea 0-4      Progression   Progression Continue to progress workloads to maintain intensity without signs/symptoms of physical distress.      Resistance Training   Training Prescription Yes    Weight 1 lb    Reps 10-15             Discharge Exercise Prescription (Final Exercise Prescription Changes):  Exercise Prescription Changes - 05/17/23 1017       Response to Exercise   Blood  Pressure (Admit) 124/64    Blood Pressure (Exit) 120/68    Heart Rate (Admit) 74 bpm    Heart Rate (Exercise) 72 bpm    Heart Rate (Exit) 65 bpm    Rating of Perceived Exertion (Exercise) 7    Symptoms None    Comments Maryn completed cardiac rehab today.    Duration Progress to 30 minutes of  aerobic without signs/symptoms of physical distress    Intensity THRR unchanged      Progression   Progression Continue to progress workloads to maintain intensity without signs/symptoms of physical distress.    Average METs 1.4      Resistance Training   Training Prescription No    Weight Relaxation day, no weights.      Interval Training   Interval Training No      NuStep   Level 3    SPM 88    Minutes 26    METs 1.4      Home Exercise Plan   Plans to continue exercise at Home (comment)   Recumbent bike, walking   Frequency Add 2 additional days to program exercise sessions.    Initial Home Exercises Provided 03/20/23  Functional Capacity:  6 Minute Walk     Row Name 02/21/23 1533 05/12/23 1200       6 Minute Walk   Phase Initial Discharge    Distance 240 feet 281 feet    Distance % Change -- 17.08 %    Distance Feet Change -- 41 ft    Walk Time 6 minutes 6 minutes    # of Rest Breaks 0 0    MPH 0.45 0.53    METS 1.35 1.4    RPE 11 10    Perceived Dyspnea  0 0    Symptoms No No    Resting HR 65 bpm 73 bpm    Resting BP 112/70 124/60    Resting Oxygen Saturation  94 % --    Exercise Oxygen Saturation  during 6 min walk 93 % --    Max Ex. HR 83 bpm 87 bpm    Max Ex. BP 160/82 136/60    2 Minute Post BP 158/74 --             Psychological, QOL, Others - Outcomes: PHQ 2/9:    05/25/2023   10:07 AM 02/21/2023    1:39 PM  Depression screen PHQ 2/9  Decreased Interest 0 0  Down, Depressed, Hopeless 0 0  PHQ - 2 Score 0 0  Altered sleeping 0 0  Tired, decreased energy 0 0  Change in appetite 0 0  Feeling bad or failure about yourself  0 0   Trouble concentrating 0 0  Moving slowly or fidgety/restless 0 0  Suicidal thoughts 0 0  PHQ-9 Score 0 0  Difficult doing work/chores Not difficult at all Not difficult at all    Quality of Life:  Quality of Life - 05/17/23 1638       Quality of Life   Select Quality of Life      Quality of Life Scores   Health/Function Pre 21.54 %    Health/Function Post 22.04 %    Health/Function % Change 2.32 %    Socioeconomic Pre 25.83 %    Socioeconomic Post 26.79 %    Socioeconomic % Change  3.72 %    Psych/Spiritual Pre 23.57 %    Psych/Spiritual Post 25.29 %    Psych/Spiritual % Change 7.3 %    Family Pre 28.5 %    Family Post 25.5 %    Family % Change -10.53 %    GLOBAL Pre 23.73 %    GLOBAL Post 24.22 %    GLOBAL % Change 2.06 %             Personal Goals: Goals established at orientation with interventions provided to work toward goal.  Personal Goals and Risk Factors at Admission - 02/21/23 1344       Core Components/Risk Factors/Patient Goals on Admission    Weight Management Yes;Obesity;Weight Loss    Intervention Weight Management/Obesity: Establish reasonable short term and long term weight goals.;Obesity: Provide education and appropriate resources to help participant work on and attain dietary goals.    Expected Outcomes Short Term: Continue to assess and modify interventions until short term weight is achieved;Long Term: Adherence to nutrition and physical activity/exercise program aimed toward attainment of established weight goal;Weight Loss: Understanding of general recommendations for a balanced deficit meal plan, which promotes 1-2 lb weight loss per week and includes a negative energy balance of [phone] kcal/d    Hypertension Yes    Intervention Provide education on lifestyle modifcations including regular  physical activity/exercise, weight management, moderate sodium restriction and increased consumption of fresh fruit, vegetables, and low fat dairy,  alcohol moderation, and smoking cessation.;Monitor prescription use compliance.    Expected Outcomes Short Term: Continued assessment and intervention until BP is < 140/64mm HG in hypertensive participants. < 130/23mm HG in hypertensive participants with diabetes, heart failure or chronic kidney disease.;Long Term: Maintenance of blood pressure at goal levels.    Lipids Yes    Intervention Provide education and support for participant on nutrition & aerobic/resistive exercise along with prescribed medications to achieve LDL 70mg , HDL >40mg .    Expected Outcomes Short Term: Participant states understanding of desired cholesterol values and is compliant with medications prescribed. Participant is following exercise prescription and nutrition guidelines.;Long Term: Cholesterol controlled with medications as prescribed, with individualized exercise RX and with personalized nutrition plan. Value goals: LDL < 70mg , HDL > 40 mg.              Personal Goals Discharge:  Goals and Risk Factor Review     Row Name 02/28/23 1510 03/09/23 1425 04/10/23 1045 05/09/23 0809       Core Components/Risk Factors/Patient Goals Review   Personal Goals Review Weight Management/Obesity;Hypertension;Lipids Weight Management/Obesity;Hypertension;Lipids Weight Management/Obesity;Hypertension;Lipids Weight Management/Obesity;Hypertension;Lipids    Review Donja started cardiac rehab on 02/27/23. Tisa did well with exercise for her fintness level as Mulki is somewhat deconditioned. Vital signs were stable. Mahala started cardiac rehab on 02/27/23. Tramya is off to a good start to exercise for her fitness level.Liberty is somewhat deconditioned. Vital signs have been stable. Shere is enjoying participating in cardiac rehab.  Vital signs remain stable.Skyla's continues to have slow progress. Irmalee is enjoying participating in cardiac rehab.  Vital signs remain stable.Winna's continues to have slow progress. Tabbitha will complete  cardiac rehab on 05/19/23.    Expected Outcomes Breea will continue to participate in cardiac rehab for exercise, nutrition and lifestyle modifications. Afsana will continue to participate in cardiac rehab for exercise, nutrition and lifestyle modifications. Othell will continue to participate in cardiac rehab for exercise, nutrition and lifestyle modifications. Kemyra will continue to participate in cardiac rehab for exercise, nutrition and lifestyle modifications.             Exercise Goals and Review:  Exercise Goals     Row Name 02/21/23 1326             Exercise Goals   Increase Physical Activity Yes       Intervention Provide advice, education, support and counseling about physical activity/exercise needs.;Develop an individualized exercise prescription for aerobic and resistive training based on initial evaluation findings, risk stratification, comorbidities and participant's personal goals.       Expected Outcomes Short Term: Attend rehab on a regular basis to increase amount of physical activity.;Long Term: Add in home exercise to make exercise part of routine and to increase amount of physical activity.;Long Term: Exercising regularly at least 3-5 days a week.       Increase Strength and Stamina Yes       Intervention Provide advice, education, support and counseling about physical activity/exercise needs.;Develop an individualized exercise prescription for aerobic and resistive training based on initial evaluation findings, risk stratification, comorbidities and participant's personal goals.       Expected Outcomes Short Term: Increase workloads from initial exercise prescription for resistance, speed, and METs.;Short Term: Perform resistance training exercises routinely during rehab and add in resistance training at home;Long Term: Improve cardiorespiratory fitness, muscular endurance and strength as measured by  increased METs and functional capacity ( )       Able to understand  and use rate of perceived exertion (RPE) scale Yes       Intervention Provide education and explanation on how to use RPE scale       Expected Outcomes Short Term: Able to use RPE daily in rehab to express subjective intensity level;Long Term:  Able to use RPE to guide intensity level when exercising independently       Knowledge and understanding of Target Heart Rate Range (THRR) Yes       Intervention Provide education and explanation of THRR including how the numbers were predicted and where they are located for reference       Expected Outcomes Short Term: Able to state/look up THRR;Long Term: Able to use THRR to govern intensity when exercising independently;Short Term: Able to use daily as guideline for intensity in rehab       Understanding of Exercise Prescription Yes       Intervention Provide education, explanation, and written materials on patient's individual exercise prescription       Expected Outcomes Short Term: Able to explain program exercise prescription;Long Term: Able to explain home exercise prescription to exercise independently                Exercise Goals Re-Evaluation:  Exercise Goals Re-Evaluation     Row Name 02/27/23 1538 03/20/23 1113 04/19/23 1112 05/05/23 1050 05/17/23 1122     Exercise Goal Re-Evaluation   Exercise Goals Review Increase Physical Activity;Increase Strength and Stamina;Able to understand and use rate of perceived exertion (RPE) scale Increase Physical Activity;Increase Strength and Stamina;Able to understand and use rate of perceived exertion (RPE) scale;Able to check pulse independently;Knowledge and understanding of Target Heart Rate Range (THRR);Understanding of Exercise Prescription Increase Physical Activity;Increase Strength and Stamina;Able to understand and use rate of perceived exertion (RPE) scale;Able to check pulse independently;Knowledge and understanding of Target Heart Rate Range (THRR);Understanding of Exercise Prescription  Increase Physical Activity;Increase Strength and Stamina;Able to understand and use rate of perceived exertion (RPE) scale;Able to check pulse independently;Knowledge and understanding of Target Heart Rate Range (THRR);Understanding of Exercise Prescription Increase Physical Activity;Increase Strength and Stamina;Able to understand and use rate of perceived exertion (RPE) scale;Able to check pulse independently;Knowledge and understanding of Target Heart Rate Range (THRR);Understanding of Exercise Prescription   Comments Arnetta was able to understand and use RPE scale appropriately. She completed one station then had to use the restroom. Reviewed exercise prescription with Dwain Giovanni. She is riding her recumbent bike 15 minutes and walking indoors 15 minutes 2 days/week as her mode of home exercise. She has an Scientist, physiological that she can use to monitor her pulse. Dericka feels good about her current exercise routine. She feels the exercise is helping her move more. She is limited by arthritis but continues to make gradual progress. Coriann plans to continue exercise riding her recumbent bike and walking at home upon completion of the cardiac rehab program. Elicia completed the cardiac rehab program today. She will continue riding her recumbent bike and walking at home.   Expected Outcomes Progress duration as tolerated to achieve 30 minutes of aerobic exericse in addition to the warm-up and cool-down. Mikia will exercise 30 minutes, 2 days/week in addition to exercise at cardiac rehab to achieve 150 minutes of aerobic exercise per week. Continue to progress workloads as tolerated to help with mobility. Continue home exercise routine in addition to exercise at cardiac rehab. Shawn will continue exercise  at home to maintain health and fitness gains.            Nutrition & Weight - Outcomes:  Pre Biometrics - 02/21/23 1313       Pre Biometrics   Waist Circumference 48 inches    Hip Circumference 51 inches    Waist  to Hip Ratio 0.94 %    Triceps Skinfold 29 mm    % Body Fat 50.2 %    Grip Strength 12 kg    Flexibility --   Not performed. Bilateral knee replacement.   Single Leg Stand --   Not performed, balance concerns.            Post Biometrics - 05/17/23 1026        Post  Biometrics   Height 5\' 3"  (1.6 m)    Waist Circumference 46 inches    Hip Circumference 48.75 inches    Waist to Hip Ratio 0.94 %    BMI (Calculated) 36.91    Triceps Skinfold 28 mm    % Body Fat 49.3 %    Grip Strength 10 kg    Flexibility --   Not performed. Bilateral knee replacement.   Single Leg Stand --   Not performed, balance concerns.            Nutrition:  Nutrition Therapy & Goals - 05/17/23 1524       Nutrition Therapy   Diet Heart healthy diet    Drug/Food Interactions Statins/Certain Fruits      Personal Nutrition Goals   Nutrition Goal Patient to identify strategies for reducing cardiovascular risk by attending the [employer] education and nutrition series weekly.   goal in action.   Personal Goal #2 Patient to improve diet quality by using the plate method as a guide for meal planning to include lean protein/plant protein, fruits, vegetables, whole grains, nonfat dairy as part of a well-balanced diet.   goal in action.   Comments Goals in progress. Taesha has medical history of TAVR, hyperlipidemia. She has attended the [employer] education and nutrition series regularly. Lipids at goal. She has maintained her weight since starting with our program. Patient will benefit from adherence to nutrition, exercise, and lifestyle modification recommendations.      Intervention Plan   Intervention Prescribe, educate and counsel regarding individualized specific dietary modifications aiming towards targeted core components such as weight, hypertension, lipid management, diabetes, heart failure and other comorbidities.;Nutrition handout(s) given to patient.    Expected Outcomes Short Term Goal: Understand  basic principles of dietary content, such as calories, fat, sodium, cholesterol and nutrients.;Long Term Goal: Adherence to prescribed nutrition plan.             Nutrition Discharge:  Nutrition Assessments - 05/17/23 1156       Rate Your Plate Scores   Pre Score 76    Post Score 76             Education Questionnaire Score:  Knowledge Questionnaire Score - 05/17/23 1122       Knowledge Questionnaire Score   Pre Score 24/24    Post Score 24/24             Goals reviewed with patient; copy given to patient.Pt graduates from  Intensive/Traditional cardiac rehab program on 05/15/23  with completion of  32 exercise and  27 education sessions. Pt maintained good attendance and progressed nicely during their participation in rehab. Met level was basically unchanged. Tyrika enjoyed participating in cardiac rehab and said  that the program has been helpful for her.    Medication list reconciled. Repeat  PHQ score-0  .  Pt has made significant lifestyle changes and should be commended for her success. Dajsha achieved their goals during cardiac rehab.   Pt plans to continue exercise at home walking at home and riding her recumbent bike  as tolerated. We are proud of Tiarah's progress.Monte Antonio RN BSN

## 2023-05-17 ENCOUNTER — Encounter (HOSPITAL_COMMUNITY)
Admission: RE | Admit: 2023-05-17 | Discharge: 2023-05-17 | Disposition: A | Discharge status: Home / Self Care | Payer: Medicare Other | Source: Ambulatory Visit | Attending: Cardiovascular Disease

## 2023-05-17 VITALS — BP 124/64 | HR 74 | Ht 63.0 in | Wt 208.3 lb

## 2023-05-17 DIAGNOSIS — Z952 Presence of prosthetic heart valve: Secondary | ICD-10-CM | POA: Diagnosis not present

## 2023-05-19 ENCOUNTER — Encounter (HOSPITAL_COMMUNITY): Payer: Medicare Other

## 2023-05-28 NOTE — Progress Notes (Unsigned)
 Cardiology Office Note:    Date:  05/29/2023   ID:  [name], DOB 01/26/1936, MRN [id number]  PCP:  Aldo Hun, MD   [facility] Providers Cardiologist:  Arnoldo Lapping, MD     Referring MD: Aldo Hun, MD   Chief Complaint  Patient presents with   Follow-up    S/P TAVR    History of Present Illness:    [name] is a 88 y.o. female with a hx of HLD, morbid obesity (BMI 36), 1st degree AV block, rheumatoid arthritis and severe aortic stenosis s/p TAVR (01/03/23) who presents to clinic for follow up.   The patient is here with her son today.  She has been doing really well ever since undergoing TAVR.  She has completed cardiac rehab and has done well with that.  She denies chest pain, chest pressure, or shortness of breath.  Her dizziness has resolved.  Her post-TAVR echo performed in January of this year showed a mean transvalvular gradient of 10 mmHg with no paravalvular regurgitation.  The patient is compliant with her medications.  She had been started on furosemide  but really does not like taking it due to the frequent urination.  She has not noticed any clinical change and she has been on it.  She still has some chronic lower extremity edema.  Current Medications: Current Meds  Medication Sig   acetaminophen  (TYLENOL ) 500 MG tablet Take 500 mg by mouth every 6 (six) hours as needed for headache.   aspirin  EC 81 MG tablet Take 1 tablet (81 mg total) by mouth daily. Swallow whole.   Cholecalciferol (VITAMIN D3) 250 MCG (10000 UT) capsule Take 10,000 Units by mouth every Monday, Wednesday, and Friday.   diphenhydrAMINE  (BENADRYL  ALLERGY) 25 MG tablet Take 12.5 mg by mouth 2 (two) times daily.   diphenhydrAMINE  (BENADRYL ) 50 MG/ML injection 50 mg Injection with infusions   estrogens, conjugated, (PREMARIN) 0.3 MG tablet Take 0.3 mg by mouth daily with breakfast.    folic acid  (FOLVITE ) 1 MG tablet Take 1 mg by mouth daily with breakfast.   furosemide   (LASIX ) 20 MG tablet Take 1 tablet (20 mg total) by mouth daily.   methotrexate (RHEUMATREX) 2.5 MG tablet Take 7.5 mg by mouth 2 (two) times a week. Caution:Chemotherapy. Protect from light. On Saturdays and Sundays   Multiple Vitamins-Minerals (PRESERVISION AREDS 2 PO) Take 1 capsule by mouth 2 (two) times daily.   OVER THE COUNTER MEDICATION Take 1 tablet by mouth daily. [employer] supplement   Propylene Glycol (SYSTANE BALANCE OP) Place 1 drop into both eyes 2 (two) times daily as needed (dry eyes).   Psyllium (METAMUCIL 3 IN 1 DAILY FIBER PO) Take 1-2 capsules by mouth daily.   rosuvastatin  (CRESTOR ) 20 MG tablet Take 20 mg by mouth daily.   saccharomyces boulardii (FLORASTOR) 250 MG capsule Take 250 mg by mouth every other day.   tocilizumab  (ACTEMRA ) 400 MG/20ML SOLN injection Inject into the vein every 30 (thirty) days.   traMADol  (ULTRAM ) 50 MG tablet Take 1-2 tablets (50-100 mg total) by mouth every 6 (six) hours as needed. (Patient taking differently: Take 25 mg by mouth daily.)   vitamin C (ASCORBIC ACID) 500 MG tablet Take 500 mg by mouth every other day.   [DISCONTINUED] RESTASIS 0.05 % ophthalmic emulsion Place 1 drop into both eyes 2 (two) times daily.     Allergies:   Atorvastatin , Morphine and codeine, and Sulfa antibiotics   ROS:   Please see  the history of present illness.    All other systems reviewed and are negative.  EKGs/Labs/Other Studies Reviewed:    The following studies were reviewed today: Cardiac Studies & Procedures   ______________________________________________________________________________________________ CARDIAC CATHETERIZATION  CARDIAC CATHETERIZATION 12/08/2022  Conclusion 1.  Chronic total occlusion of the RCA, collateralized by the left coronary artery 2.  Patent left main, LAD, and left circumflex with mild nonobstructive plaquing 3.  Calcified, restricted aortic valve leaflets with known severe aortic stenosis  Recommendations: Continue  TAVR evaluation.  Medical therapy for CAD in this patient who does not have exertional angina.  Findings Coronary Findings Diagnostic  Dominance: Right  Left Main Vessel is angiographically normal. The left main has no obstructive disease.  The vessel divides into the LAD and left circumflex.  Left Anterior Descending There is mild diffuse disease throughout the vessel. The vessel is moderately calcified. The LAD has mild nonobstructive plaquing.  The first diagonal is large in caliber with no stenosis.  The second diagonal is patent with no stenosis.  Vessel reaches the apex and supplies a collateral to the RCA.  The proximal LAD is moderately calcified.  Left Circumflex The circumflex is patent throughout.  The OM branches are patent with no stenosis.  Right Coronary Artery The proximal RCA is diffusely diseased in the mid vessel totally occludes, distally the vessel fills by left-to-right collaterals and is seen to fill all the way back to the mid RCA Prox RCA lesion is 80% stenosed. Mid RCA lesion is 100% stenosed.  Right Posterior Descending Artery Collaterals RPDA filled by collaterals from Dist LAD.  First Right Posterolateral Branch Collaterals 1st RPL filled by collaterals from 2nd Sept.  Intervention  No interventions have been documented.     ECHOCARDIOGRAM  ECHOCARDIOGRAM COMPLETE 02/10/2023  Narrative ECHOCARDIOGRAM REPORT    Patient Name:   [name] Date of Exam: 02/10/2023 Medical Rec #:  [id number]        Height:       63.0 in Accession #:    [id number]       Weight:       211.0 lb Date of Birth:  1935-11-18       BSA:          1.978 m Patient Age:    87 years         BP:           136/72 mmHg Patient Gender: F                HR:           68 bpm. Exam Location:  [address]  Procedure: 2D Echo, Cardiac Doppler, Color Doppler and Strain Analysis  Indications:    Z95.2 S/p TAVR  History:        Patient has prior history of Echocardiogram  examinations, most recent 01/04/2023. S/p TAVR 26mm CoreValve Evolut Pro 01/03/23); Risk Factors:Hypertension, Dyslipidemia and Obesity. Aortic Valve: CoreValve-Evolut Pro prosthetic, stented (TAVR) valve is present in the aortic position.  Sonographer:    Lula Sale RDCS Referring Phys: [id number] KATHRYN R THOMPSON  IMPRESSIONS   1. Left ventricular ejection fraction, by estimation, is 55 to 60%. The left ventricle has normal function. The left ventricle has no regional wall motion abnormalities. There is moderate left ventricular hypertrophy. Left ventricular diastolic parameters are consistent with Grade I diastolic dysfunction (impaired relaxation). Elevated left ventricular end-diastolic pressure. The average left ventricular global longitudinal strain is 16.3 %. The global longitudinal strain is  abnormal. 2. Right ventricular systolic function is normal. The right ventricular size is normal. There is normal pulmonary artery systolic pressure. 3. The mitral valve is normal in structure. Trivial mitral valve regurgitation. No evidence of mitral stenosis. 4. The aortic valve has been repaired/replaced. Aortic valve regurgitation is not visualized. No aortic stenosis is present. There is a CoreValve-Evolut Pro prosthetic (TAVR) valve present in the aortic position. 5. The inferior vena cava is normal in size with greater than 50% respiratory variability, suggesting right atrial pressure of 3 mmHg.  FINDINGS Left Ventricle: Left ventricular ejection fraction, by estimation, is 55 to 60%. The left ventricle has normal function. The left ventricle has no regional wall motion abnormalities. The average left ventricular global longitudinal strain is 16.3 %. The global longitudinal strain is abnormal. The left ventricular internal cavity size was normal in size. There is moderate left ventricular hypertrophy. Left ventricular diastolic parameters are consistent with Grade I diastolic dysfunction  (impaired relaxation). Elevated left ventricular end-diastolic pressure.  Right Ventricle: The right ventricular size is normal. No increase in right ventricular wall thickness. Right ventricular systolic function is normal. There is normal pulmonary artery systolic pressure. The tricuspid regurgitant velocity is 2.44 m/s, and with an assumed right atrial pressure of 3 mmHg, the estimated right ventricular systolic pressure is 26.8 mmHg.  Left Atrium: Left atrial size was normal in size.  Right Atrium: Right atrial size was normal in size.  Pericardium: There is no evidence of pericardial effusion.  Mitral Valve: The mitral valve is normal in structure. Mild mitral annular calcification. Trivial mitral valve regurgitation. No evidence of mitral valve stenosis.  Tricuspid Valve: The tricuspid valve is normal in structure. Tricuspid valve regurgitation is trivial. No evidence of tricuspid stenosis.  Aortic Valve: The aortic valve has been repaired/replaced. Aortic valve regurgitation is not visualized. Aortic regurgitation PHT measures 278 msec. No aortic stenosis is present. Aortic valve mean gradient measures 10.0 mmHg. Aortic valve peak gradient measures 22.4 mmHg. Aortic valve area, by VTI measures 1.30 cm. There is a CoreValve-Evolut Pro prosthetic, stented (TAVR) valve present in the aortic position.  Pulmonic Valve: The pulmonic valve was normal in structure. Pulmonic valve regurgitation is trivial. No evidence of pulmonic stenosis.  Aorta: The aortic root is normal in size and structure.  Venous: The inferior vena cava is normal in size with greater than 50% respiratory variability, suggesting right atrial pressure of 3 mmHg.  IAS/Shunts: No atrial level shunt detected by color flow Doppler.   LEFT VENTRICLE PLAX 2D LVIDd:         4.50 cm   Diastology LVIDs:         3.00 cm   LV e' medial:    4.57 cm/s LV PW:         1.20 cm   LV E/e' medial:  24.3 LV IVS:        1.60 cm   LV  e' lateral:   6.20 cm/s LVOT diam:     1.80 cm   LV E/e' lateral: 17.9 LV SV:         66 LV SV Index:   34        2D Longitudinal Strain LVOT Area:     2.54 cm  2D Strain GLS (A2C):   16.9 % 2D Strain GLS (A3C):   16.1 % 2D Strain GLS (A4C):   15.9 % 2D Strain GLS Avg:     16.3 %  RIGHT VENTRICLE  IVC RV S prime:     9.25 cm/s  IVC diam: 1.00 cm TAPSE (M-mode): 1.7 cm RVSP:           26.8 mmHg  LEFT ATRIUM             Index        RIGHT ATRIUM           Index LA diam:        4.10 cm 2.07 cm/m   RA Pressure: 3.00 mmHg LA Vol (A2C):   53.2 ml 26.89 ml/m  RA Area:     12.40 cm LA Vol (A4C):   51.4 ml 25.98 ml/m  RA Volume:   31.80 ml  16.08 ml/m LA Biplane Vol: 53.0 ml 26.79 ml/m AORTIC VALVE AV Area (Vmax):    1.26 cm AV Area (Vmean):   1.34 cm AV Area (VTI):     1.30 cm AV Vmax:           236.50 cm/s AV Vmean:          148.500 cm/s AV VTI:            0.512 m AV Peak Grad:      22.4 mmHg AV Mean Grad:      10.0 mmHg LVOT Vmax:         117.00 cm/s LVOT Vmean:        78.300 cm/s LVOT VTI:          0.261 m LVOT/AV VTI ratio: 0.51 AI PHT:            278 msec  AORTA Ao Root diam: 2.60 cm  MITRAL VALVE                TRICUSPID VALVE MV Area (PHT): 2.75 cm     TR Peak grad:   23.8 mmHg MV Decel Time: 276 msec     TR Vmax:        244.00 cm/s MV E velocity: 111.00 cm/s  Estimated RAP:  3.00 mmHg MV A velocity: 122.00 cm/s  RVSP:           26.8 mmHg MV E/A ratio:  0.91 SHUNTS Systemic VTI:  0.26 m Systemic Diam: 1.80 cm  Maudine Sos MD Electronically signed by Maudine Sos MD Signature Date/Time: 02/10/2023/12:44:04 PM    Final    MONITORS  LONG TERM MONITOR-LIVE TELEMETRY (3-14 DAYS) 02/07/2023  Narrative Patch Wear Time:  14 days and 0 hours (2024-11-27T12:31:19-0500 to 2024-12-11T12:31:19-0500)  Patient had a min HR of 31 bpm, max HR of 119 bpm, and avg HR of 73 bpm. Predominant underlying rhythm was Sinus Rhythm. First Degree AV Block  was present. 1 run of Supraventricular Tachycardia occurred lasting 6 beats with a max rate of 119 bpm (avg 110 bpm). Second Degree AV Block-Mobitz I (Wenckebach) was present. Isolated SVEs were rare (<1.0%), SVE Couplets were rare (<1.0%), and SVE Triplets were rare (<1.0%). Isolated VEs were rare (<1.0%, 678), VE Couplets were rare (<1.0%, 60), and VE Triplets were rare (<1.0%, 4). Ventricular Trigeminy was present.  SUMMARY: The basic rhythm is normal sinus with an average heart rate of 73 bpm.  There is no evidence of atrial fibrillation or flutter.  There is no high-grade AV block present.  No sustained arrhythmias.  Otherwise, as outlined above.   CT SCANS  CT CORONARY MORPH W/CTA COR W/SCORE 12/12/2022  Addendum 12/12/2022  3:04 PM ADDENDUM REPORT: 12/12/2022 15:01  CLINICAL DATA:  Severe Aortic Stenosis.  EXAM: Cardiac  TAVR CT  TECHNIQUE: A non-contrast, gated CT scan was obtained with axial slices of 3 mm through the heart for aortic valve calcium  scoring. A 120 kV retrospective, gated, contrast cardiac scan was obtained. Gantry rotation speed was 250 msecs and collimation was 0.6 mm. Nitroglycerin  was not given. The 3D data set was reconstructed in 5% intervals of the 0-95% of the R-R cycle. Systolic and diastolic phases were analyzed on a dedicated workstation using MPR, MIP, and VRT modes. The patient received 100 cc of contrast.  FINDINGS: Image quality: Excellent.  Noise artifact is: Limited.  Valve Morphology: Tricuspid aortic valve with severe calcifications and severe thickening. Severely restricted leaflet movement in systole. Bulky calcification of the NCC.  Aortic Valve Calcium  score: 2121  Aortic annular dimension:  Phase assessed: 25%  Annular area: 360 mm2  Annular perimeter: 68.4 mm  Max diameter: 24.3 mm  Min diameter: 18.9 mm  Annular and subannular calcification: None.  Membranous septum length: 7.0 mm  Optimal coplanar projection: RAO 2  CRA 1  Coronary Artery Height above Annulus:  Left Main: 10.7 mm  Right Coronary: 14.2 mm  Sinus of Valsalva Measurements:  Non-coronary: 27 mm  Right-coronary: 28 mm  Left-coronary: 28 mm  Sinus of Valsalva Height:  Non-coronary: 17.2 mm  Right-coronary: 18.1 mm  Left-coronary: 19.0 mm  Sinotubular Junction: 28 mm  Ascending Thoracic Aorta: 35 mm  Coronary Arteries: Normal coronary origin. Right dominance. The study was performed without use of NTG and is insufficient for plaque evaluation. Please refer to recent cardiac catheterization for coronary assessment. 3-vessel coronary calcifications noted.  Cardiac Morphology:  Right Atrium: Right atrial size is within normal limits.  Right Ventricle: The right ventricular cavity is within normal limits.  Left Atrium: Left atrial size is normal in size with no left atrial appendage filling defect.  Left Ventricle: The ventricular cavity size is within normal limits.  Pulmonary arteries: Normal in size without proximal filling defect.  Pulmonary veins: Normal pulmonary venous drainage.  Pericardium: Normal thickness with no significant effusion or calcium  present.  Mitral Valve: The mitral valve is normal structure without significant calcification.  Extra-cardiac findings: See attached radiology report for non-cardiac structures.  IMPRESSION: 1. Tricuspid aortic valve. Annular measurements support a 23 mm S3 or 26 mm Evolut Pro.  2. No significant annular or subannular calcifications.  3. Left main height 10.7 mm but no risk of obstruction with virtual valve.  4. Optimal Fluoroscopic Angle for Delivery: RAO 2 CRA 1  Talala T. Rolm Clos, MD   Electronically Signed By: Jackquelyn Mass M.D. On: 12/12/2022 15:01  Narrative EXAM: OVER-READ INTERPRETATION  CT CHEST  The following report is a limited chest CT over-read performed by radiologist Dr. Karlyn Overman of [facility] on  12/12/2022. This over-read does not include interpretation of cardiac or coronary anatomy or pathology. The cardiac CTA interpretation by the cardiologist is attached.  COMPARISON:  01/24/2018 coronary CT.  FINDINGS: Please see the separate concurrent chest CT angiogram report for details.  IMPRESSION: Please see the separate concurrent chest CT angiogram report for details.  Electronically Signed: By: Levell Reach M.D. On: 12/12/2022 11:59   CT SCANS  CT CARDIAC SCORING (SELF PAY ONLY) 01/24/2018  Narrative CLINICAL DATA:  Elevated cholesterol  EXAM: CT HEART FOR CALCIUM  SCORING  TECHNIQUE: CT heart was performed on a 64 channel system using prospective ECG gating.  A non-contrast exam for calcium  scoring was performed.  Note that this exam targets the heart and the  chest was not imaged in its entirety.  COMPARISON:  None.  FINDINGS: Technical quality: Good.  CORONARY CALCIUM   Total Agatston Score: 505 with extensive calcifications throughout the left anterior descending coronary artery. Calcifications also noted in the proximal left circumflex and in the mid RCA  MESA database percentile:  81  OTHER FINDINGS:  Cardiovascular: Heart is normal size. Aorta is normal caliber. Scattered aortic calcifications.  Mediastinum/Nodes: No adenopathy in the lower mediastinum or hila.  Lungs/Pleura: Peripheral interstitial prominence compatible with scarring in the lung bases. No effusions or confluent opacities.  Upper Abdomen: Imaging into the upper abdomen shows no acute findings.  Musculoskeletal: Chest wall soft tissues are unremarkable. No acute bony abnormality.  IMPRESSION: The observed calcium  score of 505 is at the percentile 81 for subjects of the same age, gender and race/ethnicity who are free of clinical cardiovascular disease and treated diabetes.  No acute extra cardiac abnormality.  Aortic atherosclerosis.   Electronically Signed By:  Janeece Mechanic M.D. On: 01/24/2018 11:56     ______________________________________________________________________________________________      EKG:        Recent Labs: 12/30/2022: ALT 15 01/04/2023: Magnesium  2.1 01/13/2023: Hemoglobin 14.2; Platelets 144 02/28/2023: BUN 17; Creatinine, Ser 0.67; Potassium 4.1; Sodium 141  Recent Lipid Panel    Component Value Date/Time   CHOL 160 09/15/2020 1041   TRIG 169 (H) 09/15/2020 1041   HDL 59 09/15/2020 1041   CHOLHDL 2.7 09/15/2020 1041   LDLCALC 73 09/15/2020 1041     Risk Assessment/Calculations:                Physical Exam:    VS:  BP 134/80   Ht 5\' 4"  (1.626 m)   Wt 206 lb 3.2 oz (93.5 kg)   SpO2 95%   BMI 35.39 kg/m     Wt Readings from Last 3 Encounters:  05/29/23 206 lb 3.2 oz (93.5 kg)  05/17/23 208 lb 5.4 oz (94.5 kg)  02/28/23 208 lb (94.3 kg)     GEN:  Well nourished, well developed in no acute distress HEENT: Normal NECK: No JVD; No carotid bruits LYMPHATICS: No lymphadenopathy CARDIAC: RRR, 2/6 early systolic murmur at the RUSB, no diastolic murmur RESPIRATORY:  Clear to auscultation without rales, wheezing or rhonchi  ABDOMEN: Soft, non-tender, non-distended MUSCULOSKELETAL: 1+ bilateral ankle edema; No deformity  SKIN: Warm and dry NEUROLOGIC:  Alert and oriented x 3 PSYCHIATRIC:  Normal affect   Assessment & Plan S/P TAVR (transcatheter aortic valve replacement) Normal TAVR prosthetic function on most recent echocardiogram.  We discussed ongoing surveillance and she will have an echocardiogram in November when she returns for 1 year follow-up.  She follows SBE prophylaxis when indicated.  She continues on aspirin  for antiplatelet therapy. Essential hypertension Blood pressure controlled.  Discussed the importance of continued physical activity and exercise as tolerated. Hyperlipidemia with target LDL less than 70 Treated with rosuvastatin  20 mg daily.  Last lipids with an LDL of 69  mg/dL. Aortic atherosclerosis (HCC) Treated with aspirin  and a statin drug.            Medication Adjustments/Labs and Tests Ordered: Current medicines are reviewed at length with the patient today.  Concerns regarding medicines are outlined above.  No orders of the defined types were placed in this encounter.  No orders of the defined types were placed in this encounter.   Patient Instructions  Medication Instructions:  CHANGE Furosemide  to as needed only *If you need a refill on your  cardiac medications before your next appointment, please call your pharmacy*  Follow-Up: At [facility], you and your health needs are our priority.  As part of our continuing mission to provide you with exceptional heart care, our providers are all part of one team.  This team includes your primary Cardiologist (physician) and Advanced Practice Providers or APPs (Physician Assistants and Nurse Practitioners) who all work together to provide you with the care you need, when you need it.  Your next appointment:   1 year(s)  Provider:   Arnoldo Lapping, MD        1st Floor: - Lobby - Registration  - Pharmacy  - Lab - Cafe  2nd Floor: - PV Lab - Diagnostic Testing (echo, CT, nuclear med)  3rd Floor: - Vacant  4th Floor: - TCTS (cardiothoracic surgery) - AFib Clinic - Structural Heart Clinic - Vascular Surgery  - Vascular Ultrasound  5th Floor: - HeartCare Cardiology (general and EP) - Clinical Pharmacy for coumadin, hypertension, lipid, weight-loss medications, and med management appointments    Valet parking services will be available as well.     Signed, Arnoldo Lapping, MD  05/29/2023 9:44 AM    [facility]

## 2023-05-29 ENCOUNTER — Encounter: Payer: Self-pay | Admitting: Cardiovascular Disease

## 2023-05-29 ENCOUNTER — Ambulatory Visit: Payer: Medicare Other | Attending: Cardiovascular Disease | Admitting: Cardiovascular Disease

## 2023-05-29 VITALS — BP 134/80 | Ht 64.0 in | Wt 206.2 lb

## 2023-05-29 DIAGNOSIS — I10 Essential (primary) hypertension: Secondary | ICD-10-CM | POA: Diagnosis not present

## 2023-05-29 DIAGNOSIS — I700 Atherosclerosis of aorta: Secondary | ICD-10-CM | POA: Diagnosis not present

## 2023-05-29 DIAGNOSIS — Z952 Presence of prosthetic heart valve: Secondary | ICD-10-CM | POA: Diagnosis not present

## 2023-05-29 DIAGNOSIS — E785 Hyperlipidemia, unspecified: Secondary | ICD-10-CM | POA: Diagnosis not present

## 2023-05-29 MED ORDER — FUROSEMIDE 20 MG PO TABS: ORAL_TABLET | ORAL | 3 refills | Status: DC

## 2023-05-29 NOTE — Patient Instructions (Signed)
 Medication Instructions:  CHANGE Furosemide  to as needed only *If you need a refill on your cardiac medications before your next appointment, please call your pharmacy*  Follow-Up: At [facility], you and your health needs are our priority.  As part of our continuing mission to provide you with exceptional heart care, our providers are all part of one team.  This team includes your primary Cardiologist (physician) and Advanced Practice Providers or APPs (Physician Assistants and Nurse Practitioners) who all work together to provide you with the care you need, when you need it.  Your next appointment:   1 year(s)  Provider:   Arnoldo Lapping, MD        1st Floor: - Lobby - Registration  - Pharmacy  - Lab - Cafe  2nd Floor: - PV Lab - Diagnostic Testing (echo, CT, nuclear med)  3rd Floor: - Vacant  4th Floor: - TCTS (cardiothoracic surgery) - AFib Clinic - Structural Heart Clinic - Vascular Surgery  - Vascular Ultrasound  5th Floor: - HeartCare Cardiology (general and EP) - Clinical Pharmacy for coumadin, hypertension, lipid, weight-loss medications, and med management appointments    Valet parking services will be available as well.

## 2023-05-29 NOTE — Assessment & Plan Note (Signed)
 Normal TAVR prosthetic function on most recent echocardiogram.  We discussed ongoing surveillance and she will have an echocardiogram in November when she returns for 1 year follow-up.  She follows SBE prophylaxis when indicated.  She continues on aspirin  for antiplatelet therapy.

## 2023-05-30 DIAGNOSIS — H353211 Exudative age-related macular degeneration, right eye, with active choroidal neovascularization: Secondary | ICD-10-CM | POA: Diagnosis not present

## 2023-06-01 DIAGNOSIS — H35371 Puckering of macula, right eye: Secondary | ICD-10-CM | POA: Diagnosis not present

## 2023-06-01 DIAGNOSIS — H31003 Unspecified chorioretinal scars, bilateral: Secondary | ICD-10-CM | POA: Diagnosis not present

## 2023-06-01 DIAGNOSIS — H43811 Vitreous degeneration, right eye: Secondary | ICD-10-CM | POA: Diagnosis not present

## 2023-06-01 DIAGNOSIS — Z961 Presence of intraocular lens: Secondary | ICD-10-CM | POA: Diagnosis not present

## 2023-06-01 DIAGNOSIS — H353122 Nonexudative age-related macular degeneration, left eye, intermediate dry stage: Secondary | ICD-10-CM | POA: Diagnosis not present

## 2023-06-01 DIAGNOSIS — H353211 Exudative age-related macular degeneration, right eye, with active choroidal neovascularization: Secondary | ICD-10-CM | POA: Diagnosis not present

## 2023-06-13 DIAGNOSIS — Z79899 Other long term (current) drug therapy: Secondary | ICD-10-CM | POA: Diagnosis not present

## 2023-06-13 DIAGNOSIS — M0579 Rheumatoid arthritis with rheumatoid factor of multiple sites without organ or systems involvement: Secondary | ICD-10-CM | POA: Diagnosis not present

## 2023-06-22 DIAGNOSIS — M8588 Other specified disorders of bone density and structure, other site: Secondary | ICD-10-CM | POA: Diagnosis not present

## 2023-06-22 DIAGNOSIS — E2839 Other primary ovarian failure: Secondary | ICD-10-CM | POA: Diagnosis not present

## 2023-06-22 DIAGNOSIS — N958 Other specified menopausal and perimenopausal disorders: Secondary | ICD-10-CM | POA: Diagnosis not present

## 2023-06-29 DIAGNOSIS — H353211 Exudative age-related macular degeneration, right eye, with active choroidal neovascularization: Secondary | ICD-10-CM | POA: Diagnosis not present

## 2023-06-29 DIAGNOSIS — Z961 Presence of intraocular lens: Secondary | ICD-10-CM | POA: Diagnosis not present

## 2023-06-29 DIAGNOSIS — H43811 Vitreous degeneration, right eye: Secondary | ICD-10-CM | POA: Diagnosis not present

## 2023-06-29 DIAGNOSIS — H35371 Puckering of macula, right eye: Secondary | ICD-10-CM | POA: Diagnosis not present

## 2023-06-29 DIAGNOSIS — H31003 Unspecified chorioretinal scars, bilateral: Secondary | ICD-10-CM | POA: Diagnosis not present

## 2023-06-29 DIAGNOSIS — H353122 Nonexudative age-related macular degeneration, left eye, intermediate dry stage: Secondary | ICD-10-CM | POA: Diagnosis not present

## 2023-07-11 DIAGNOSIS — M0579 Rheumatoid arthritis with rheumatoid factor of multiple sites without organ or systems involvement: Secondary | ICD-10-CM | POA: Diagnosis not present

## 2023-07-25 DIAGNOSIS — E538 Deficiency of other specified B group vitamins: Secondary | ICD-10-CM | POA: Diagnosis not present

## 2023-08-01 DIAGNOSIS — M810 Age-related osteoporosis without current pathological fracture: Secondary | ICD-10-CM | POA: Diagnosis not present

## 2023-08-01 DIAGNOSIS — I2510 Atherosclerotic heart disease of native coronary artery without angina pectoris: Secondary | ICD-10-CM | POA: Diagnosis not present

## 2023-08-01 DIAGNOSIS — R7301 Impaired fasting glucose: Secondary | ICD-10-CM | POA: Diagnosis not present

## 2023-08-01 DIAGNOSIS — R030 Elevated blood-pressure reading, without diagnosis of hypertension: Secondary | ICD-10-CM | POA: Diagnosis not present

## 2023-08-01 DIAGNOSIS — E785 Hyperlipidemia, unspecified: Secondary | ICD-10-CM | POA: Diagnosis not present

## 2023-08-01 DIAGNOSIS — K219 Gastro-esophageal reflux disease without esophagitis: Secondary | ICD-10-CM | POA: Diagnosis not present

## 2023-08-08 DIAGNOSIS — J309 Allergic rhinitis, unspecified: Secondary | ICD-10-CM | POA: Diagnosis not present

## 2023-08-08 DIAGNOSIS — I2510 Atherosclerotic heart disease of native coronary artery without angina pectoris: Secondary | ICD-10-CM | POA: Diagnosis not present

## 2023-08-08 DIAGNOSIS — E785 Hyperlipidemia, unspecified: Secondary | ICD-10-CM | POA: Diagnosis not present

## 2023-08-08 DIAGNOSIS — M069 Rheumatoid arthritis, unspecified: Secondary | ICD-10-CM | POA: Diagnosis not present

## 2023-08-08 DIAGNOSIS — H3530 Unspecified macular degeneration: Secondary | ICD-10-CM | POA: Diagnosis not present

## 2023-08-08 DIAGNOSIS — R82998 Other abnormal findings in urine: Secondary | ICD-10-CM | POA: Diagnosis not present

## 2023-08-08 DIAGNOSIS — R030 Elevated blood-pressure reading, without diagnosis of hypertension: Secondary | ICD-10-CM | POA: Diagnosis not present

## 2023-08-08 DIAGNOSIS — I350 Nonrheumatic aortic (valve) stenosis: Secondary | ICD-10-CM | POA: Diagnosis not present

## 2023-08-08 DIAGNOSIS — E538 Deficiency of other specified B group vitamins: Secondary | ICD-10-CM | POA: Diagnosis not present

## 2023-08-08 DIAGNOSIS — M5431 Sciatica, right side: Secondary | ICD-10-CM | POA: Diagnosis not present

## 2023-08-08 DIAGNOSIS — E7849 Other hyperlipidemia: Secondary | ICD-10-CM | POA: Diagnosis not present

## 2023-08-08 DIAGNOSIS — Z0000 Encounter for general adult medical examination without abnormal findings: Secondary | ICD-10-CM | POA: Diagnosis not present

## 2023-08-08 DIAGNOSIS — D126 Benign neoplasm of colon, unspecified: Secondary | ICD-10-CM | POA: Diagnosis not present

## 2023-08-08 DIAGNOSIS — M810 Age-related osteoporosis without current pathological fracture: Secondary | ICD-10-CM | POA: Diagnosis not present

## 2023-08-10 DIAGNOSIS — H353211 Exudative age-related macular degeneration, right eye, with active choroidal neovascularization: Secondary | ICD-10-CM | POA: Diagnosis not present

## 2023-08-15 DIAGNOSIS — M0579 Rheumatoid arthritis with rheumatoid factor of multiple sites without organ or systems involvement: Secondary | ICD-10-CM | POA: Diagnosis not present

## 2023-08-29 DIAGNOSIS — M0579 Rheumatoid arthritis with rheumatoid factor of multiple sites without organ or systems involvement: Secondary | ICD-10-CM | POA: Diagnosis not present

## 2023-08-29 DIAGNOSIS — M810 Age-related osteoporosis without current pathological fracture: Secondary | ICD-10-CM | POA: Diagnosis not present

## 2023-08-29 DIAGNOSIS — Z79899 Other long term (current) drug therapy: Secondary | ICD-10-CM | POA: Diagnosis not present

## 2023-08-29 DIAGNOSIS — M1991 Primary osteoarthritis, unspecified site: Secondary | ICD-10-CM | POA: Diagnosis not present

## 2023-08-29 DIAGNOSIS — M5431 Sciatica, right side: Secondary | ICD-10-CM | POA: Diagnosis not present

## 2023-08-29 DIAGNOSIS — Z6835 Body mass index (BMI) 35.0-35.9, adult: Secondary | ICD-10-CM | POA: Diagnosis not present

## 2023-08-29 DIAGNOSIS — Z111 Encounter for screening for respiratory tuberculosis: Secondary | ICD-10-CM | POA: Diagnosis not present

## 2023-08-29 DIAGNOSIS — E669 Obesity, unspecified: Secondary | ICD-10-CM | POA: Diagnosis not present

## 2023-08-29 DIAGNOSIS — R5383 Other fatigue: Secondary | ICD-10-CM | POA: Diagnosis not present

## 2023-09-05 DIAGNOSIS — N390 Urinary tract infection, site not specified: Secondary | ICD-10-CM | POA: Diagnosis not present

## 2023-09-05 DIAGNOSIS — R8290 Unspecified abnormal findings in urine: Secondary | ICD-10-CM | POA: Diagnosis not present

## 2023-09-05 DIAGNOSIS — R319 Hematuria, unspecified: Secondary | ICD-10-CM | POA: Diagnosis not present

## 2023-09-05 DIAGNOSIS — N3289 Other specified disorders of bladder: Secondary | ICD-10-CM | POA: Diagnosis not present

## 2023-09-12 DIAGNOSIS — R5383 Other fatigue: Secondary | ICD-10-CM | POA: Diagnosis not present

## 2023-09-12 DIAGNOSIS — H524 Presbyopia: Secondary | ICD-10-CM | POA: Diagnosis not present

## 2023-09-12 DIAGNOSIS — M0579 Rheumatoid arthritis with rheumatoid factor of multiple sites without organ or systems involvement: Secondary | ICD-10-CM | POA: Diagnosis not present

## 2023-09-12 DIAGNOSIS — Z111 Encounter for screening for respiratory tuberculosis: Secondary | ICD-10-CM | POA: Diagnosis not present

## 2023-09-12 DIAGNOSIS — H353211 Exudative age-related macular degeneration, right eye, with active choroidal neovascularization: Secondary | ICD-10-CM | POA: Diagnosis not present

## 2023-09-12 DIAGNOSIS — H04123 Dry eye syndrome of bilateral lacrimal glands: Secondary | ICD-10-CM | POA: Diagnosis not present

## 2023-09-13 DIAGNOSIS — R2243 Localized swelling, mass and lump, lower limb, bilateral: Secondary | ICD-10-CM | POA: Diagnosis not present

## 2023-09-13 DIAGNOSIS — Z96653 Presence of artificial knee joint, bilateral: Secondary | ICD-10-CM | POA: Diagnosis not present

## 2023-09-25 DIAGNOSIS — M069 Rheumatoid arthritis, unspecified: Secondary | ICD-10-CM | POA: Diagnosis not present

## 2023-09-25 DIAGNOSIS — N390 Urinary tract infection, site not specified: Secondary | ICD-10-CM | POA: Diagnosis not present

## 2023-09-25 DIAGNOSIS — R600 Localized edema: Secondary | ICD-10-CM | POA: Diagnosis not present

## 2023-09-25 DIAGNOSIS — N368 Other specified disorders of urethra: Secondary | ICD-10-CM | POA: Diagnosis not present

## 2023-10-03 DIAGNOSIS — M810 Age-related osteoporosis without current pathological fracture: Secondary | ICD-10-CM | POA: Diagnosis not present

## 2023-10-05 DIAGNOSIS — R350 Frequency of micturition: Secondary | ICD-10-CM | POA: Diagnosis not present

## 2023-10-05 DIAGNOSIS — N3289 Other specified disorders of bladder: Secondary | ICD-10-CM | POA: Diagnosis not present

## 2023-10-05 DIAGNOSIS — N390 Urinary tract infection, site not specified: Secondary | ICD-10-CM | POA: Diagnosis not present

## 2023-10-05 DIAGNOSIS — R300 Dysuria: Secondary | ICD-10-CM | POA: Diagnosis not present

## 2023-10-10 DIAGNOSIS — M0579 Rheumatoid arthritis with rheumatoid factor of multiple sites without organ or systems involvement: Secondary | ICD-10-CM | POA: Diagnosis not present

## 2023-10-12 DIAGNOSIS — H43811 Vitreous degeneration, right eye: Secondary | ICD-10-CM | POA: Diagnosis not present

## 2023-10-12 DIAGNOSIS — H35371 Puckering of macula, right eye: Secondary | ICD-10-CM | POA: Diagnosis not present

## 2023-10-12 DIAGNOSIS — Z961 Presence of intraocular lens: Secondary | ICD-10-CM | POA: Diagnosis not present

## 2023-10-12 DIAGNOSIS — H353211 Exudative age-related macular degeneration, right eye, with active choroidal neovascularization: Secondary | ICD-10-CM | POA: Diagnosis not present

## 2023-10-12 DIAGNOSIS — H353123 Nonexudative age-related macular degeneration, left eye, advanced atrophic without subfoveal involvement: Secondary | ICD-10-CM | POA: Diagnosis not present

## 2023-10-12 DIAGNOSIS — H31093 Other chorioretinal scars, bilateral: Secondary | ICD-10-CM | POA: Diagnosis not present

## 2023-10-13 DIAGNOSIS — N3941 Urge incontinence: Secondary | ICD-10-CM | POA: Diagnosis not present

## 2023-10-13 DIAGNOSIS — N952 Postmenopausal atrophic vaginitis: Secondary | ICD-10-CM | POA: Diagnosis not present

## 2023-10-13 DIAGNOSIS — R3121 Asymptomatic microscopic hematuria: Secondary | ICD-10-CM | POA: Diagnosis not present

## 2023-10-13 DIAGNOSIS — N3001 Acute cystitis with hematuria: Secondary | ICD-10-CM | POA: Diagnosis not present

## 2023-10-13 DIAGNOSIS — R3914 Feeling of incomplete bladder emptying: Secondary | ICD-10-CM | POA: Diagnosis not present

## 2023-11-02 ENCOUNTER — Telehealth: Payer: Self-pay | Admitting: Physician Assistant

## 2023-11-02 DIAGNOSIS — I10 Essential (primary) hypertension: Secondary | ICD-10-CM

## 2023-11-02 MED ORDER — FUROSEMIDE 20 MG PO TABS: ORAL_TABLET | ORAL | 1 refills | Status: DC

## 2023-11-02 NOTE — Telephone Encounter (Signed)
  [facility]   MULTIDISCIPLINARY HEART VALVE TEAM   Pt called in and reported swelling in her legs since being treated for a UTI and taking lasix  everyday instead of PRN. This is totally fine but I asked her to come in for a BMET to just check renal function and potassium.   Lamarr Hummer PA-C  [facility]

## 2023-11-06 ENCOUNTER — Telehealth: Payer: Self-pay | Admitting: Physician Assistant

## 2023-11-06 NOTE — Telephone Encounter (Signed)
 Spoke with pt son Ozell. Ozell states he saw his mom late that week and her legs were at least 1/3 bigger than they normally are. States he is very worried about here and would like her to be seen sooner. Does not know of an other symptoms but notes from last week stat she has been taking lasix  daily seen UTI. Appt set with Dr Wonda 11/08/23. Ozell stated understanding.

## 2023-11-06 NOTE — Telephone Encounter (Signed)
 Pts son requesting a c/b in regards to [facility]

## 2023-11-08 ENCOUNTER — Ambulatory Visit: Payer: Self-pay | Admitting: Physician Assistant

## 2023-11-08 ENCOUNTER — Ambulatory Visit: Attending: Internal Medicine | Admitting: Cardiovascular Disease

## 2023-11-08 ENCOUNTER — Encounter: Payer: Self-pay | Admitting: Cardiovascular Disease

## 2023-11-08 VITALS — BP 136/74 | HR 64 | Ht 64.0 in | Wt 216.6 lb

## 2023-11-08 DIAGNOSIS — Z952 Presence of prosthetic heart valve: Secondary | ICD-10-CM | POA: Diagnosis not present

## 2023-11-08 DIAGNOSIS — I10 Essential (primary) hypertension: Secondary | ICD-10-CM | POA: Diagnosis not present

## 2023-11-08 DIAGNOSIS — E785 Hyperlipidemia, unspecified: Secondary | ICD-10-CM | POA: Insufficient documentation

## 2023-11-08 DIAGNOSIS — R600 Localized edema: Secondary | ICD-10-CM | POA: Diagnosis not present

## 2023-11-08 LAB — BASIC METABOLIC PANEL WITH GFR
BUN/Creatinine Ratio: 22 (ref 12–28)
BUN: 15 mg/dL (ref 8–27)
CO2: 27 mmol/L (ref 20–29)
Calcium: 9 mg/dL (ref 8.7–10.3)
Chloride: 103 mmol/L (ref 96–106)
Creatinine, Ser: 0.67 mg/dL (ref 0.57–1.00)
Glucose: 103 mg/dL — ABNORMAL HIGH (ref 70–99)
Potassium: 3.9 mmol/L (ref 3.5–5.2)
Sodium: 141 mmol/L (ref 134–144)
eGFR: 86 mL/min/1.73 (ref >59)

## 2023-11-08 MED ORDER — TORSEMIDE 20 MG PO TABS: 20.0000 mg | ORAL_TABLET | Freq: Every day | ORAL | 3 refills | Status: AC

## 2023-11-08 MED ORDER — POTASSIUM CHLORIDE CRYS ER 20 MEQ PO TBCR: 20.0000 meq | EXTENDED_RELEASE_TABLET | Freq: Every day | ORAL | 3 refills | Status: AC

## 2023-11-08 NOTE — Patient Instructions (Addendum)
 Medication Instructions:  STOP Lasix    START Torsemide 20 mg once daily   START Potassium 20 mEq once daily   *If you need a refill on your cardiac medications before your next appointment, please call your pharmacy*  Lab Work: None ordered today. If you have labs (blood work) drawn today and your tests are completely normal, you will receive your results only by: MyChart Message (if you have MyChart) OR A paper copy in the mail If you have any lab test that is abnormal or we need to change your treatment, we will call you to review the results.  Testing/Procedures: Your physician has requested that you have an echocardiogram. Echocardiography is a painless test that uses sound waves to create images of your heart. It provides your doctor with information about the size and shape of your heart and how well your heart's chambers and valves are working. This procedure takes approximately one hour. There are no restrictions for this procedure. Please do NOT wear cologne, perfume, aftershave, or lotions (deodorant is allowed). Please arrive 15 minutes prior to your appointment time.  Please note: We ask at that you not bring children with you during ultrasound (echo/ vascular) testing. Due to room size and safety concerns, children are not allowed in the ultrasound rooms during exams. Our front office staff cannot provide observation of children in our lobby area while testing is being conducted. An adult accompanying a patient to their appointment will only be allowed in the ultrasound room at the discretion of the ultrasound technician under special circumstances. We apologize for any inconvenience.   Follow-Up: At [facility], you and your health needs are our priority.  As part of our continuing mission to provide you with exceptional heart care, our providers are all part of one team.  This team includes your primary Cardiologist (physician) and Advanced Practice Providers or APPs  (Physician Assistants and Nurse Practitioners) who all work together to provide you with the care you need, when you need it.  Your next appointment:   3 month(s)  Provider:   One of our Advanced Practice Providers (APPs): Morse Clause, PA-C  Lamarr Satterfield, NP Miriam Shams, NP  Olivia Pavy, PA-C Josefa Beauvais, NP  Leontine Salen, PA-C Orren Fabry, PA-C  Hao Meng, PA-C Ernest Dick, NP  Damien Braver, NP Jon Hails, PA-C  Waddell Donath, PA-C    Dayna Dunn, PA-C  Scott Weaver, PA-C Lum Louis, NP Katlyn West, NP Callie Goodrich, PA-C  Xika Zhao, NP Sheng Haley, PA-C    Kathleen Johnson, PA-C   We recommend signing up for the patient portal called MyChart.  Sign up information is provided on this After Visit Summary.  MyChart is used to connect with patients for Virtual Visits (Telemedicine).  Patients are able to view lab/test results, encounter notes, upcoming appointments, etc.  Non-urgent messages can be sent to your provider as well.   To learn more about what you can do with MyChart, go to [email or web address].   Other Instructions   For your  leg edema you  should do  the following 1. Leg elevation - I recommend the Lounge Dr. Leg rest.  See below for details  2. Salt restriction  -  Use potassium chloride  instead of regular salt as a salt substitute. 3. Walk regularly 4. Compression hose - [facility] [employer] store  5. Weight loss    Available on [email or web address] Or  Go to [email or web address]

## 2023-11-08 NOTE — Progress Notes (Unsigned)
 Cardiology Office Note:    Date:  11/09/2023   ID:  [name], DOB Jul 05, 1935, MRN [id number]  PCP:  Shayne Anes, MD   [facility] Providers Cardiologist:  Ozell Fell, MD     Referring MD: Shayne Anes, MD   Chief Complaint  Patient presents with   Leg Swelling    History of Present Illness:    [name] is a 88 y.o. female with a hx of severe aortic stenosis status post TAVR in November 2024, presenting for follow-up evaluation.  She called in because of progressive lower extremity swelling.  The patient is here with her son today.  She has had a lot of problems with leg swelling over the last 6 weeks.  She has had issues with recurrent urinary tract infection and has been drinking a lot of water and cranberry juice.  She has been taking furosemide  20 mg daily but her legs have become more swollen and uncomfortable for her.  She is not getting around very well because of her swelling and heavy legs.  She denies orthopnea, PND, shortness of breath, or chest pain.  She has not been eating a lot of salt.  She otherwise has no specific complaints today.   Current Medications: Current Meds  Medication Sig   acetaminophen  (TYLENOL ) 500 MG tablet Take 500 mg by mouth every 6 (six) hours as needed for headache.   aspirin  EC 81 MG tablet Take 1 tablet (81 mg total) by mouth daily. Swallow whole.   cephALEXin (KEFLEX) 250 MG capsule Take 250 mg by mouth at bedtime.   Cholecalciferol (VITAMIN D3) 250 MCG (10000 UT) capsule Take 10,000 Units by mouth every Monday, Wednesday, and Friday.   ciprofloxacin (CIPRO) 250 MG tablet Take 250 mg by mouth at bedtime.   diphenhydrAMINE  (BENADRYL  ALLERGY) 25 MG tablet Take 12.5 mg by mouth 2 (two) times daily.   diphenhydrAMINE  (BENADRYL ) 50 MG/ML injection 50 mg Injection with infusions   estrogens, conjugated, (PREMARIN) 0.3 MG tablet Take 0.3 mg by mouth daily with breakfast.    folic acid  (FOLVITE ) 1 MG tablet Take 1 mg by  mouth daily with breakfast.   methotrexate (RHEUMATREX) 2.5 MG tablet Take 7.5 mg by mouth 2 (two) times a week. Caution:Chemotherapy. Protect from light. On Saturdays and Sundays   Multiple Vitamins-Minerals (PRESERVISION AREDS 2 PO) Take 1 capsule by mouth 2 (two) times daily.   OVER THE COUNTER MEDICATION Take 1 tablet by mouth daily. [employer] supplement   potassium chloride  SA (KLOR-CON  M20) 20 MEQ tablet Take 1 tablet (20 mEq total) by mouth daily.   Propylene Glycol (SYSTANE BALANCE OP) Place 1 drop into both eyes 2 (two) times daily as needed (dry eyes).   Psyllium (METAMUCIL 3 IN 1 DAILY FIBER PO) Take 1-2 capsules by mouth daily.   rosuvastatin  (CRESTOR ) 20 MG tablet Take 20 mg by mouth daily.   saccharomyces boulardii (FLORASTOR) 250 MG capsule Take 250 mg by mouth every other day.   tocilizumab  (ACTEMRA ) 400 MG/20ML SOLN injection Inject into the vein every 30 (thirty) days.   torsemide (DEMADEX) 20 MG tablet Take 1 tablet (20 mg total) by mouth daily.   traMADol  (ULTRAM ) 50 MG tablet Take 1-2 tablets (50-100 mg total) by mouth every 6 (six) hours as needed. (Patient taking differently: Take 25 mg by mouth daily.)   vitamin C (ASCORBIC ACID) 500 MG tablet Take 500 mg by mouth every other day.   [DISCONTINUED] furosemide  (LASIX ) 20 MG tablet  Take 1 tablet by mouth as needed     Allergies:   Atorvastatin , Morphine and codeine, and Sulfa antibiotics   ROS:   Please see the history of present illness.    All other systems reviewed and are negative.  EKGs/Labs/Other Studies Reviewed:    The following studies were reviewed today: Cardiac Studies & Procedures   ______________________________________________________________________________________________ CARDIAC CATHETERIZATION  CARDIAC CATHETERIZATION 12/08/2022  Conclusion 1.  Chronic total occlusion of the RCA, collateralized by the left coronary artery 2.  Patent left main, LAD, and left circumflex with mild  nonobstructive plaquing 3.  Calcified, restricted aortic valve leaflets with known severe aortic stenosis  Recommendations: Continue TAVR evaluation.  Medical therapy for CAD in this patient who does not have exertional angina.  Findings Coronary Findings Diagnostic  Dominance: Right  Left Main Vessel is angiographically normal. The left main has no obstructive disease.  The vessel divides into the LAD and left circumflex.  Left Anterior Descending There is mild diffuse disease throughout the vessel. The vessel is moderately calcified. The LAD has mild nonobstructive plaquing.  The first diagonal is large in caliber with no stenosis.  The second diagonal is patent with no stenosis.  Vessel reaches the apex and supplies a collateral to the RCA.  The proximal LAD is moderately calcified.  Left Circumflex The circumflex is patent throughout.  The OM branches are patent with no stenosis.  Right Coronary Artery The proximal RCA is diffusely diseased in the mid vessel totally occludes, distally the vessel fills by left-to-right collaterals and is seen to fill all the way back to the mid RCA Prox RCA lesion is 80% stenosed. Mid RCA lesion is 100% stenosed.  Right Posterior Descending Artery Collaterals RPDA filled by collaterals from Dist LAD.  First Right Posterolateral Branch Collaterals 1st RPL filled by collaterals from 2nd Sept.  Intervention  No interventions have been documented.     ECHOCARDIOGRAM  ECHOCARDIOGRAM COMPLETE 02/10/2023  Narrative ECHOCARDIOGRAM REPORT    Patient Name:   [name] Date of Exam: 02/10/2023 Medical Rec #:  [id number]        Height:       63.0 in Accession #:    [id number]       Weight:       211.0 lb Date of Birth:  1935-09-19       BSA:          1.978 m Patient Age:    87 years         BP:           136/72 mmHg Patient Gender: F                HR:           68 bpm. Exam Location:  [address]  Procedure: 2D Echo, Cardiac  Doppler, Color Doppler and Strain Analysis  Indications:    Z95.2 S/p TAVR  History:        Patient has prior history of Echocardiogram examinations, most recent 01/04/2023. S/p TAVR 26mm CoreValve Evolut Pro 01/03/23); Risk Factors:Hypertension, Dyslipidemia and Obesity. Aortic Valve: CoreValve-Evolut Pro prosthetic, stented (TAVR) valve is present in the aortic position.  Sonographer:    Elsie Bohr RDCS Referring Phys: [id number] KATHRYN R THOMPSON  IMPRESSIONS   1. Left ventricular ejection fraction, by estimation, is 55 to 60%. The left ventricle has normal function. The left ventricle has no regional wall motion abnormalities. There is moderate left ventricular hypertrophy. Left ventricular diastolic parameters are  consistent with Grade I diastolic dysfunction (impaired relaxation). Elevated left ventricular end-diastolic pressure. The average left ventricular global longitudinal strain is 16.3 %. The global longitudinal strain is abnormal. 2. Right ventricular systolic function is normal. The right ventricular size is normal. There is normal pulmonary artery systolic pressure. 3. The mitral valve is normal in structure. Trivial mitral valve regurgitation. No evidence of mitral stenosis. 4. The aortic valve has been repaired/replaced. Aortic valve regurgitation is not visualized. No aortic stenosis is present. There is a CoreValve-Evolut Pro prosthetic (TAVR) valve present in the aortic position. 5. The inferior vena cava is normal in size with greater than 50% respiratory variability, suggesting right atrial pressure of 3 mmHg.  FINDINGS Left Ventricle: Left ventricular ejection fraction, by estimation, is 55 to 60%. The left ventricle has normal function. The left ventricle has no regional wall motion abnormalities. The average left ventricular global longitudinal strain is 16.3 %. The global longitudinal strain is abnormal. The left ventricular internal cavity size was normal in  size. There is moderate left ventricular hypertrophy. Left ventricular diastolic parameters are consistent with Grade I diastolic dysfunction (impaired relaxation). Elevated left ventricular end-diastolic pressure.  Right Ventricle: The right ventricular size is normal. No increase in right ventricular wall thickness. Right ventricular systolic function is normal. There is normal pulmonary artery systolic pressure. The tricuspid regurgitant velocity is 2.44 m/s, and with an assumed right atrial pressure of 3 mmHg, the estimated right ventricular systolic pressure is 26.8 mmHg.  Left Atrium: Left atrial size was normal in size.  Right Atrium: Right atrial size was normal in size.  Pericardium: There is no evidence of pericardial effusion.  Mitral Valve: The mitral valve is normal in structure. Mild mitral annular calcification. Trivial mitral valve regurgitation. No evidence of mitral valve stenosis.  Tricuspid Valve: The tricuspid valve is normal in structure. Tricuspid valve regurgitation is trivial. No evidence of tricuspid stenosis.  Aortic Valve: The aortic valve has been repaired/replaced. Aortic valve regurgitation is not visualized. Aortic regurgitation PHT measures 278 msec. No aortic stenosis is present. Aortic valve mean gradient measures 10.0 mmHg. Aortic valve peak gradient measures 22.4 mmHg. Aortic valve area, by VTI measures 1.30 cm. There is a CoreValve-Evolut Pro prosthetic, stented (TAVR) valve present in the aortic position.  Pulmonic Valve: The pulmonic valve was normal in structure. Pulmonic valve regurgitation is trivial. No evidence of pulmonic stenosis.  Aorta: The aortic root is normal in size and structure.  Venous: The inferior vena cava is normal in size with greater than 50% respiratory variability, suggesting right atrial pressure of 3 mmHg.  IAS/Shunts: No atrial level shunt detected by color flow Doppler.   LEFT VENTRICLE PLAX 2D LVIDd:         4.50 cm    Diastology LVIDs:         3.00 cm   LV e' medial:    4.57 cm/s LV PW:         1.20 cm   LV E/e' medial:  24.3 LV IVS:        1.60 cm   LV e' lateral:   6.20 cm/s LVOT diam:     1.80 cm   LV E/e' lateral: 17.9 LV SV:         66 LV SV Index:   34        2D Longitudinal Strain LVOT Area:     2.54 cm  2D Strain GLS (A2C):   16.9 % 2D Strain GLS (A3C):   16.1 %  2D Strain GLS (A4C):   15.9 % 2D Strain GLS Avg:     16.3 %  RIGHT VENTRICLE            IVC RV S prime:     9.25 cm/s  IVC diam: 1.00 cm TAPSE (M-mode): 1.7 cm RVSP:           26.8 mmHg  LEFT ATRIUM             Index        RIGHT ATRIUM           Index LA diam:        4.10 cm 2.07 cm/m   RA Pressure: 3.00 mmHg LA Vol (A2C):   53.2 ml 26.89 ml/m  RA Area:     12.40 cm LA Vol (A4C):   51.4 ml 25.98 ml/m  RA Volume:   31.80 ml  16.08 ml/m LA Biplane Vol: 53.0 ml 26.79 ml/m AORTIC VALVE AV Area (Vmax):    1.26 cm AV Area (Vmean):   1.34 cm AV Area (VTI):     1.30 cm AV Vmax:           236.50 cm/s AV Vmean:          148.500 cm/s AV VTI:            0.512 m AV Peak Grad:      22.4 mmHg AV Mean Grad:      10.0 mmHg LVOT Vmax:         117.00 cm/s LVOT Vmean:        78.300 cm/s LVOT VTI:          0.261 m LVOT/AV VTI ratio: 0.51 AI PHT:            278 msec  AORTA Ao Root diam: 2.60 cm  MITRAL VALVE                TRICUSPID VALVE MV Area (PHT): 2.75 cm     TR Peak grad:   23.8 mmHg MV Decel Time: 276 msec     TR Vmax:        244.00 cm/s MV E velocity: 111.00 cm/s  Estimated RAP:  3.00 mmHg MV A velocity: 122.00 cm/s  RVSP:           26.8 mmHg MV E/A ratio:  0.91 SHUNTS Systemic VTI:  0.26 m Systemic Diam: 1.80 cm  Annabella Scarce MD Electronically signed by Annabella Scarce MD Signature Date/Time: 02/10/2023/12:44:04 PM    Final    MONITORS  LONG TERM MONITOR-LIVE TELEMETRY (3-14 DAYS) 02/07/2023  Narrative Patch Wear Time:  14 days and 0 hours (2024-11-27T12:31:19-0500 to  2024-12-11T12:31:19-0500)  Patient had a min HR of 31 bpm, max HR of 119 bpm, and avg HR of 73 bpm. Predominant underlying rhythm was Sinus Rhythm. First Degree AV Block was present. 1 run of Supraventricular Tachycardia occurred lasting 6 beats with a max rate of 119 bpm (avg 110 bpm). Second Degree AV Block-Mobitz I (Wenckebach) was present. Isolated SVEs were rare (<1.0%), SVE Couplets were rare (<1.0%), and SVE Triplets were rare (<1.0%). Isolated VEs were rare (<1.0%, 678), VE Couplets were rare (<1.0%, 60), and VE Triplets were rare (<1.0%, 4). Ventricular Trigeminy was present.  SUMMARY: The basic rhythm is normal sinus with an average heart rate of 73 bpm.  There is no evidence of atrial fibrillation or flutter.  There is no high-grade AV block present.  No sustained arrhythmias.  Otherwise, as outlined above.  CT SCANS  CT CORONARY MORPH W/CTA COR W/SCORE 12/12/2022  Addendum 12/12/2022  3:04 PM ADDENDUM REPORT: 12/12/2022 15:01  CLINICAL DATA:  Severe Aortic Stenosis.  EXAM: Cardiac TAVR CT  TECHNIQUE: A non-contrast, gated CT scan was obtained with axial slices of 3 mm through the heart for aortic valve calcium  scoring. A 120 kV retrospective, gated, contrast cardiac scan was obtained. Gantry rotation speed was 250 msecs and collimation was 0.6 mm. Nitroglycerin  was not given. The 3D data set was reconstructed in 5% intervals of the 0-95% of the R-R cycle. Systolic and diastolic phases were analyzed on a dedicated workstation using MPR, MIP, and VRT modes. The patient received 100 cc of contrast.  FINDINGS: Image quality: Excellent.  Noise artifact is: Limited.  Valve Morphology: Tricuspid aortic valve with severe calcifications and severe thickening. Severely restricted leaflet movement in systole. Bulky calcification of the NCC.  Aortic Valve Calcium  score: 2121  Aortic annular dimension:  Phase assessed: 25%  Annular area: 360 mm2  Annular perimeter: 68.4  mm  Max diameter: 24.3 mm  Min diameter: 18.9 mm  Annular and subannular calcification: None.  Membranous septum length: 7.0 mm  Optimal coplanar projection: RAO 2 CRA 1  Coronary Artery Height above Annulus:  Left Main: 10.7 mm  Right Coronary: 14.2 mm  Sinus of Valsalva Measurements:  Non-coronary: 27 mm  Right-coronary: 28 mm  Left-coronary: 28 mm  Sinus of Valsalva Height:  Non-coronary: 17.2 mm  Right-coronary: 18.1 mm  Left-coronary: 19.0 mm  Sinotubular Junction: 28 mm  Ascending Thoracic Aorta: 35 mm  Coronary Arteries: Normal coronary origin. Right dominance. The study was performed without use of NTG and is insufficient for plaque evaluation. Please refer to recent cardiac catheterization for coronary assessment. 3-vessel coronary calcifications noted.  Cardiac Morphology:  Right Atrium: Right atrial size is within normal limits.  Right Ventricle: The right ventricular cavity is within normal limits.  Left Atrium: Left atrial size is normal in size with no left atrial appendage filling defect.  Left Ventricle: The ventricular cavity size is within normal limits.  Pulmonary arteries: Normal in size without proximal filling defect.  Pulmonary veins: Normal pulmonary venous drainage.  Pericardium: Normal thickness with no significant effusion or calcium  present.  Mitral Valve: The mitral valve is normal structure without significant calcification.  Extra-cardiac findings: See attached radiology report for non-cardiac structures.  IMPRESSION: 1. Tricuspid aortic valve. Annular measurements support a 23 mm S3 or 26 mm Evolut Pro.  2. No significant annular or subannular calcifications.  3. Left main height 10.7 mm but no risk of obstruction with virtual valve.  4. Optimal Fluoroscopic Angle for Delivery: RAO 2 CRA 1  [place] T. Barbaraann, MD   Electronically Signed By: Darryle Barbaraann M.D. On: 12/12/2022  15:01  Narrative EXAM: OVER-READ INTERPRETATION  CT CHEST  The following report is a limited chest CT over-read performed by radiologist Dr. Selinda Blue of [facility] on 12/12/2022. This over-read does not include interpretation of cardiac or coronary anatomy or pathology. The cardiac CTA interpretation by the cardiologist is attached.  COMPARISON:  01/24/2018 coronary CT.  FINDINGS: Please see the separate concurrent chest CT angiogram report for details.  IMPRESSION: Please see the separate concurrent chest CT angiogram report for details.  Electronically Signed: By: Selinda DELENA Blue M.D. On: 12/12/2022 11:59   CT SCANS  CT CARDIAC SCORING (SELF PAY ONLY) 01/24/2018  Narrative CLINICAL DATA:  Elevated cholesterol  EXAM: CT HEART FOR CALCIUM  SCORING  TECHNIQUE: CT heart  was performed on a 64 channel system using prospective ECG gating.  A non-contrast exam for calcium  scoring was performed.  Note that this exam targets the heart and the chest was not imaged in its entirety.  COMPARISON:  None.  FINDINGS: Technical quality: Good.  CORONARY CALCIUM   Total Agatston Score: 505 with extensive calcifications throughout the left anterior descending coronary artery. Calcifications also noted in the proximal left circumflex and in the mid RCA  MESA database percentile:  81  OTHER FINDINGS:  Cardiovascular: Heart is normal size. Aorta is normal caliber. Scattered aortic calcifications.  Mediastinum/Nodes: No adenopathy in the lower mediastinum or hila.  Lungs/Pleura: Peripheral interstitial prominence compatible with scarring in the lung bases. No effusions or confluent opacities.  Upper Abdomen: Imaging into the upper abdomen shows no acute findings.  Musculoskeletal: Chest wall soft tissues are unremarkable. No acute bony abnormality.  IMPRESSION: The observed calcium  score of 505 is at the percentile 81 for subjects of the same age,  gender and race/ethnicity who are free of clinical cardiovascular disease and treated diabetes.  No acute extra cardiac abnormality.  Aortic atherosclerosis.   Electronically Signed By: Franky Crease M.D. On: 01/24/2018 11:56     ______________________________________________________________________________________________      EKG:   EKG Interpretation Date/Time:  Wednesday November 08 2023 15:30:00 EDT Ventricular Rate:  63 PR Interval:  324 QRS Duration:  96 QT Interval:  402 QTC Calculation: 411 R Axis:   -11  Text Interpretation: Sinus rhythm with 1st degree A-V block Anterior infarct (cited on or before 08-Nov-2023) ST & T wave abnormality, consider lateral ischemia When compared with ECG of 13-Jan-2023 09:29, No significant change was found Confirmed by Wonda Sharper [id number]) on 11/09/2023 10:50:59 AM    Recent Labs: 12/30/2022: ALT 15 01/04/2023: Magnesium  2.1 01/13/2023: Hemoglobin 14.2; Platelets 144 11/07/2023: BUN 15; Creatinine, Ser 0.67; Potassium 3.9; Sodium 141  Recent Lipid Panel    Component Value Date/Time   CHOL 160 09/15/2020 1041   TRIG 169 (H) 09/15/2020 1041   HDL 59 09/15/2020 1041   CHOLHDL 2.7 09/15/2020 1041   LDLCALC 73 09/15/2020 1041     Risk Assessment/Calculations:                Physical Exam:    VS:  BP 136/74   Pulse 64   Ht 5' 4 (1.626 m)   Wt 216 lb 9.6 oz (98.2 kg)   SpO2 96%   BMI 37.18 kg/m     Wt Readings from Last 3 Encounters:  11/08/23 216 lb 9.6 oz (98.2 kg)  05/29/23 206 lb 3.2 oz (93.5 kg)  05/17/23 208 lb 5.4 oz (94.5 kg)     GEN: Pleasant elderly woman in no acute distress HEENT: Normal NECK: No JVD; No carotid bruits LYMPHATICS: No lymphadenopathy CARDIAC: RRR, 2/6 early peaking ejection murmur at the right upper sternal border RESPIRATORY:  Clear to auscultation without rales, wheezing or rhonchi  ABDOMEN: Soft, non-tender, non-distended MUSCULOSKELETAL: 3+ bilateral pretibial edema; No  deformity  SKIN: Warm and dry with chronic stasis dermatitis of the lower extremities NEUROLOGIC:  Alert and oriented x 3 PSYCHIATRIC:  Normal affect   Assessment & Plan Essential hypertension Blood pressure is controlled.  Not currently requiring antihypertensive medication. Hyperlipidemia with target LDL less than 70 Tolerating rosuvastatin  20 mg daily.  Last LDL cholesterol is 55. S/P TAVR (transcatheter aortic valve replacement) Patient has had normal function of her TAVR prosthesis.  Last echo February 10, 2023 showed normal LVEF of  55 to 60%, moderate LVH, grade 1 diastolic dysfunction, normal RV function, mean transaortic gradient of 10 mmHg and no paravalvular regurgitation. Bilateral leg edema Acute on chronic problem.  Suspect venous insufficiency is the primary issue.  Unable to get compression stockings on.  Discussed the importance of leg elevation at length with her.  Demonstrated the lounge doctor device to her.  I talked to her about fluid restriction as she has actually been drinking a lot of fluids which is probably contributing.  Advised her to discontinue furosemide  and start torsemide 20 mg daily.  If she has a suboptimal response will increase to 40 mg daily.  Will add potassium chloride  20 mill equivalents daily and she should have a metabolic panel checked in a few weeks.  I am going to update her echocardiogram to make sure that what we are seeing is not related to heart failure.  With an absence of shortness of breath, orthopnea, or PND, as well as the presence of clear lung fields on her exam and no JVD, I suspect this is more of a venous insufficiency issue.  If she has a suboptimal response to the above measures, we will refer her to vascular to see if she might qualify for some sort of lower extremity compression device.       Medication Adjustments/Labs and Tests Ordered: Current medicines are reviewed at length with the patient today.  Concerns regarding medicines are  outlined above.  Orders Placed This Encounter  Procedures   EKG 12-Lead   ECHOCARDIOGRAM COMPLETE   Meds ordered this encounter  Medications   torsemide (DEMADEX) 20 MG tablet    Sig: Take 1 tablet (20 mg total) by mouth daily.    Dispense:  30 tablet    Refill:  3   potassium chloride  SA (KLOR-CON  M20) 20 MEQ tablet    Sig: Take 1 tablet (20 mEq total) by mouth daily.    Dispense:  30 tablet    Refill:  3    Patient Instructions  Medication Instructions:  STOP Lasix    START Torsemide 20 mg once daily   START Potassium 20 mEq once daily   *If you need a refill on your cardiac medications before your next appointment, please call your pharmacy*  Lab Work: None ordered today. If you have labs (blood work) drawn today and your tests are completely normal, you will receive your results only by: MyChart Message (if you have MyChart) OR A paper copy in the mail If you have any lab test that is abnormal or we need to change your treatment, we will call you to review the results.  Testing/Procedures: Your physician has requested that you have an echocardiogram. Echocardiography is a painless test that uses sound waves to create images of your heart. It provides your doctor with information about the size and shape of your heart and how well your heart's chambers and valves are working. This procedure takes approximately one hour. There are no restrictions for this procedure. Please do NOT wear cologne, perfume, aftershave, or lotions (deodorant is allowed). Please arrive 15 minutes prior to your appointment time.  Please note: We ask at that you not bring children with you during ultrasound (echo/ vascular) testing. Due to room size and safety concerns, children are not allowed in the ultrasound rooms during exams. Our front office staff cannot provide observation of children in our lobby area while testing is being conducted. An adult accompanying a patient to their appointment will  only be  allowed in the ultrasound room at the discretion of the ultrasound technician under special circumstances. We apologize for any inconvenience.   Follow-Up: At [facility], you and your health needs are our priority.  As part of our continuing mission to provide you with exceptional heart care, our providers are all part of one team.  This team includes your primary Cardiologist (physician) and Advanced Practice Providers or APPs (Physician Assistants and Nurse Practitioners) who all work together to provide you with the care you need, when you need it.  Your next appointment:   3 month(s)  Provider:   One of our Advanced Practice Providers (APPs): Morse Clause, PA-C  Lamarr Satterfield, NP Miriam Shams, NP  Olivia Pavy, PA-C Josefa Beauvais, NP  Leontine Salen, PA-C Orren Fabry, PA-C  Hao Meng, PA-C Ernest Dick, NP  Damien Braver, NP Jon Hails, PA-C  Waddell Donath, PA-C    Dayna Dunn, PA-C  Scott Weaver, PA-C Lum Louis, NP Katlyn West, NP Callie Goodrich, PA-C  Xika Zhao, NP Sheng Haley, PA-C    Kathleen Johnson, PA-C   We recommend signing up for the patient portal called MyChart.  Sign up information is provided on this After Visit Summary.  MyChart is used to connect with patients for Virtual Visits (Telemedicine).  Patients are able to view lab/test results, encounter notes, upcoming appointments, etc.  Non-urgent messages can be sent to your provider as well.   To learn more about what you can do with MyChart, go to [email or web address].   Other Instructions   For your  leg edema you  should do  the following 1. Leg elevation - I recommend the Lounge Dr. Leg rest.  See below for details  2. Salt restriction  -  Use potassium chloride  instead of regular salt as a salt substitute. 3. Walk regularly 4. Compression hose - Medical Supply store  5. Weight loss    Available on [email or web address] Or  Go to [email or web address]        Signed, Ozell Fell, MD  11/09/2023 10:51 AM    [facility]

## 2023-11-09 ENCOUNTER — Other Ambulatory Visit: Payer: Self-pay

## 2023-11-09 ENCOUNTER — Encounter: Payer: Self-pay | Admitting: Cardiovascular Disease

## 2023-11-09 DIAGNOSIS — I10 Essential (primary) hypertension: Secondary | ICD-10-CM

## 2023-11-09 NOTE — Assessment & Plan Note (Signed)
 Patient has had normal function of her TAVR prosthesis.  Last echo February 10, 2023 showed normal LVEF of 55 to 60%, moderate LVH, grade 1 diastolic dysfunction, normal RV function, mean transaortic gradient of 10 mmHg and no paravalvular regurgitation.

## 2023-11-09 NOTE — Progress Notes (Signed)
 Attempted to call pt to let her know of the BMP needing to be completed in 2-3 weeks. Left detailed message per DPR on file and pt told to call our office with any questions.

## 2023-11-13 DIAGNOSIS — I872 Venous insufficiency (chronic) (peripheral): Secondary | ICD-10-CM | POA: Diagnosis not present

## 2023-11-13 DIAGNOSIS — Z23 Encounter for immunization: Secondary | ICD-10-CM | POA: Diagnosis not present

## 2023-11-13 DIAGNOSIS — R600 Localized edema: Secondary | ICD-10-CM | POA: Diagnosis not present

## 2023-11-13 DIAGNOSIS — M069 Rheumatoid arthritis, unspecified: Secondary | ICD-10-CM | POA: Diagnosis not present

## 2023-11-21 ENCOUNTER — Other Ambulatory Visit: Payer: Self-pay | Admitting: Cardiovascular Disease

## 2023-11-21 DIAGNOSIS — I872 Venous insufficiency (chronic) (peripheral): Secondary | ICD-10-CM | POA: Diagnosis not present

## 2023-11-21 DIAGNOSIS — M069 Rheumatoid arthritis, unspecified: Secondary | ICD-10-CM | POA: Diagnosis not present

## 2023-11-21 DIAGNOSIS — R600 Localized edema: Secondary | ICD-10-CM | POA: Diagnosis not present

## 2023-11-22 ENCOUNTER — Ambulatory Visit: Payer: Self-pay | Admitting: Physician Assistant

## 2023-11-22 LAB — BASIC METABOLIC PANEL WITH GFR
BUN/Creatinine Ratio: 17 (ref 12–28)
BUN: 15 mg/dL (ref 8–27)
CO2: 28 mmol/L (ref 20–29)
Calcium: 9.1 mg/dL (ref 8.7–10.3)
Chloride: 99 mmol/L (ref 96–106)
Creatinine, Ser: 0.87 mg/dL (ref 0.57–1.00)
Glucose: 151 mg/dL — ABNORMAL HIGH (ref 70–99)
Potassium: 3.8 mmol/L (ref 3.5–5.2)
Sodium: 140 mmol/L (ref 134–144)
eGFR: 65 mL/min/1.73 (ref >59)

## 2023-12-05 ENCOUNTER — Encounter (HOSPITAL_BASED_OUTPATIENT_CLINIC_OR_DEPARTMENT_OTHER): Payer: Self-pay

## 2023-12-05 DIAGNOSIS — M0579 Rheumatoid arthritis with rheumatoid factor of multiple sites without organ or systems involvement: Secondary | ICD-10-CM | POA: Diagnosis not present

## 2023-12-05 DIAGNOSIS — Z79899 Other long term (current) drug therapy: Secondary | ICD-10-CM | POA: Diagnosis not present

## 2023-12-06 ENCOUNTER — Ambulatory Visit (INDEPENDENT_AMBULATORY_CARE_PROVIDER_SITE_OTHER)

## 2023-12-06 DIAGNOSIS — Z952 Presence of prosthetic heart valve: Secondary | ICD-10-CM | POA: Diagnosis not present

## 2023-12-06 LAB — ECHOCARDIOGRAM COMPLETE
AR max vel: 1.48 cm2
AV Area VTI: 1.47 cm2
AV Area mean vel: 1.38 cm2
AV Mean grad: 6 mmHg
AV Peak grad: 10.8 mmHg
Ao pk vel: 1.64 m/s
Area-P 1/2: 2.63 cm2
P 1/2 time: 376 ms
S' Lateral: 2.61 cm

## 2023-12-10 ENCOUNTER — Ambulatory Visit: Payer: Self-pay | Admitting: Cardiovascular Disease

## 2023-12-21 DIAGNOSIS — I872 Venous insufficiency (chronic) (peripheral): Secondary | ICD-10-CM | POA: Diagnosis not present

## 2023-12-25 ENCOUNTER — Other Ambulatory Visit (HOSPITAL_COMMUNITY)

## 2023-12-25 ENCOUNTER — Ambulatory Visit: Attending: Physician Assistant | Admitting: Physician Assistant

## 2023-12-25 VITALS — BP 128/62 | HR 68 | Ht 63.5 in | Wt 204.2 lb

## 2023-12-25 DIAGNOSIS — Z952 Presence of prosthetic heart valve: Secondary | ICD-10-CM | POA: Diagnosis not present

## 2023-12-25 DIAGNOSIS — R2681 Unsteadiness on feet: Secondary | ICD-10-CM | POA: Diagnosis not present

## 2023-12-25 DIAGNOSIS — I878 Other specified disorders of veins: Secondary | ICD-10-CM | POA: Insufficient documentation

## 2023-12-25 DIAGNOSIS — E785 Hyperlipidemia, unspecified: Secondary | ICD-10-CM | POA: Insufficient documentation

## 2023-12-25 DIAGNOSIS — M069 Rheumatoid arthritis, unspecified: Secondary | ICD-10-CM | POA: Diagnosis not present

## 2023-12-25 DIAGNOSIS — I10 Essential (primary) hypertension: Secondary | ICD-10-CM | POA: Diagnosis not present

## 2023-12-25 NOTE — Progress Notes (Signed)
 [facility]   MULTIDISCIPLINARY HEART VALVE CLINIC                                     Cardiology Office Note:    Date:  12/25/2023   ID:  JOYCELIN RADLOFF, DOB 10/28/1935, MRN [id number]  PCP:  Shayne Anes, MD  [facility] Cardiologist:  Ozell Fell, MD  [facility] Structural heart: Ozell Fell, MD [facility] Electrophysiologist:  None   Referring MD: Shayne Anes, MD   1 year s/p TAVR  History of Present Illness:    [name] is a 88 y.o. female with a hx of HLD, morbid obesity (BMI 36), 1st degree AV block, rheumatoid arthritis and severe aortic stenosis s/p TAVR (01/03/23) who presents to clinic for follow up.   She has been followed over time for aortic stenosis. She recently developed fatigue and dizziness. Echo 09/20/22 showed EF 60% and severe AS with a mean grad 42 mmHg, AVA 0.98 cm2. [facility] 12/08/22 showed chronic total occlusion of the RCA, collateralized by the left coronary artery as well a patent left main, LAD, and left circumflex with mild nonobstructive plaquing. Medical therapy recommended given lack of exertional angina. S/p TAVR with a 26 Medtronic FX THV via the TF approach on 01/03/23. Post operative echo showed EF 65%, normally functioning TAVR with a mean gradient of 9 mmHg and no PVL as well as mild MR. Started on a baby Asprin 81mg  daily (computer flagged a potential interaction with methotrexate but discussed with pharmacy and it should be okay). Given progressive first deg AV block and intermittent junctional rhythm, a Zio AT was placed at discharge. This did not show any significant arrhythmias. She had a significant improvement since TAVR.  Seen by Dr. Fell in early October with worsening LE edema felt to be related to venous insufficiency. Lifestyle modifications recommended and Lasix  switched to torsemide. Echo 12/05/33 showed EF 60%, mild MR, normally functioning TAVR with a mean gradient of 6 mm hg and no PVL.    Today  the patient presents to clinic for follow up. Here with her son Ozell. She has several UTIs followed by issues with worsening LE edema felt to be 2/2 chronic venous stasis. Switched from lasix  to torsemide and developed a rash on right leg that has now resolved. No CP or SOB. No orthopnea or PND. Occasional dizziness with head movements but no syncope. Has some occasional fatigue a couple times a week. Has calluses on feet that keep her from walking due to gait instability. She and her son understand that venous stasis is not easily managed with a pill and requires exercise and leg elevation.    Past Medical History:  Diagnosis Date   GERD (gastroesophageal reflux disease)    Hyperlipidemia    Hyperthyroidism    Hypothyroidism    Macular degeneration    Morbid obesity (HCC)    Osteoporosis    PONV (postoperative nausea and vomiting)    none recently   S/P TAVR (transcatheter aortic valve replacement) 01/03/2023   s/p TAVR with a 26 mm Medtronic FX via the TF approach by Dr. Fell & Barle   Severe aortic stenosis    Shingles 04/21/2014   seen by Dr Shayne and treated with Valtrex     Current Medications: Current Meds  Medication Sig   acetaminophen  (TYLENOL ) 500 MG tablet Take 500 mg by mouth every  6 (six) hours as needed for headache.   aspirin  EC 81 MG tablet Take 1 tablet (81 mg total) by mouth daily. Swallow whole.   Cholecalciferol (VITAMIN D3) 250 MCG (10000 UT) capsule Take 10,000 Units by mouth every Monday, Wednesday, and Friday.   diphenhydrAMINE  (BENADRYL  ALLERGY) 25 MG tablet Take 12.5 mg by mouth 2 (two) times daily.   diphenhydrAMINE  (BENADRYL ) 50 MG/ML injection 50 mg Injection with infusions   estrogens, conjugated, (PREMARIN) 0.3 MG tablet Take 0.3 mg by mouth daily with breakfast.    folic acid  (FOLVITE ) 1 MG tablet Take 1 mg by mouth daily with breakfast.   methotrexate (RHEUMATREX) 2.5 MG tablet Take 7.5 mg by mouth 2 (two) times a week. Caution:Chemotherapy.  Protect from light. On Saturdays and Sundays   Multiple Vitamins-Minerals (PRESERVISION AREDS 2 PO) Take 1 capsule by mouth 2 (two) times daily.   OVER THE COUNTER MEDICATION Take 1 tablet by mouth daily. [employer] supplement   potassium chloride  SA (KLOR-CON  M20) 20 MEQ tablet Take 1 tablet (20 mEq total) by mouth daily.   Propylene Glycol (SYSTANE BALANCE OP) Place 1 drop into both eyes 2 (two) times daily as needed (dry eyes).   Psyllium (METAMUCIL 3 IN 1 DAILY FIBER PO) Take 1-2 capsules by mouth daily.   rosuvastatin  (CRESTOR ) 20 MG tablet Take 20 mg by mouth daily.   saccharomyces boulardii (FLORASTOR) 250 MG capsule Take 250 mg by mouth every other day.   tocilizumab  (ACTEMRA ) 400 MG/20ML SOLN injection Inject into the vein every 30 (thirty) days.   torsemide (DEMADEX) 20 MG tablet Take 1 tablet (20 mg total) by mouth daily.   traMADol  (ULTRAM ) 50 MG tablet Take 1-2 tablets (50-100 mg total) by mouth every 6 (six) hours as needed. (Patient taking differently: Take 25 mg by mouth daily.)   vitamin C (ASCORBIC ACID) 500 MG tablet Take 500 mg by mouth every other day.      ROS:   Please see the history of present illness.    All other systems reviewed and are negative.  EKGs       Risk Assessment/Calculations:           Physical Exam:    VS:  BP 128/62   Pulse 68   Ht 5' 3.5 (1.613 m)   Wt 204 lb 3.2 oz (92.6 kg)   BMI 35.61 kg/m     Wt Readings from Last 3 Encounters:  12/25/23 204 lb 3.2 oz (92.6 kg)  11/08/23 216 lb 9.6 oz (98.2 kg)  05/29/23 206 lb 3.2 oz (93.5 kg)     GEN: Well nourished, well developed in no acute distress NECK: No JVD CARDIAC: RRR, no murmurs, rubs, gallops RESPIRATORY:  Clear to auscultation without rales, wheezing or rhonchi  ABDOMEN: Soft, non-tender, non-distended EXTREMITIES:  2+ pitting edema to just below knees bilaterally.   ASSESSMENT:    1. S/P TAVR (transcatheter aortic valve replacement)   2. Venous stasis   3. Gait  instability   4. Essential hypertension   5. Hyperlipidemia with target LDL less than 70   6. Rheumatoid arthritis, involving unspecified site, unspecified whether rheumatoid factor present (HCC)     PLAN:    In order of problems listed above:  Severe AS s/p TAVR:  -- Echo 12/05/33 showed EF 60%, mild MR, normally functioning TAVR with a mean gradient of 6 mm hg and no PVL.  -- NYHA class I symptoms.  -- Continue aspirin  81mg  daily.  -- Gets cephalexin  through dentist.  -- Continue regular follow up with Dr. Wonda.   Chronic venous insufficiency with gait instability: -- Continue torsemide 20mg  daily and KCL 20 meq daily.  -- I have offered HHPT to help her get moving as much as possible -- Scheduled for venous dopplers and vascular appt in Feb. Would like to see if these can be moved up.   HTN: -- BP well controlled off anti hypertensives aside from torsemide.    HLD:  -- Continue Crestor  20mg  daily.   RA: -- Continue methotrexate and Actemra      Medication Adjustments/Labs and Tests Ordered: Current medicines are reviewed at length with the patient today.  Concerns regarding medicines are outlined above.  Orders Placed This Encounter  Procedures   Home Health   Face-to-face encounter (required for Medicare/Medicaid patients)   No orders of the defined types were placed in this encounter.   Patient Instructions  Medication Instructions:   Your provider recommends that you continue on your current medications as directed. Please refer to the Current Medication list given to you today.  *If you need a refill on your cardiac medications before your next appointment, please call your pharmacy*  Lab Work: none If you have labs (blood work) drawn today and your tests are completely normal, you will receive your results only by: MyChart Message (if you have MyChart) OR A paper copy in the mail If you have any lab test that is abnormal or we need to change your  treatment, we will call you to review the results.  Testing/Procedures: none  Follow-Up: At [facility], you and your health needs are our priority.  As part of our continuing mission to provide you with exceptional heart care, our providers are all part of one team.  This team includes your primary Cardiologist (physician) and Advanced Practice Providers or APPs (Physician Assistants and Nurse Practitioners) who all work together to provide you with the care you need, when you need it.  Your next appointment:    Please keep your follow up as scheduled.   We have sent in a referral for home health PT and will reach out to vascular to see if Feb apts can be moved up.    Signed, Lamarr Hummer, PA-C  12/25/2023 10:10 AM    [facility]

## 2023-12-25 NOTE — Patient Instructions (Signed)
 Medication Instructions:   Your provider recommends that you continue on your current medications as directed. Please refer to the Current Medication list given to you today.  *If you need a refill on your cardiac medications before your next appointment, please call your pharmacy*  Lab Work: none If you have labs (blood work) drawn today and your tests are completely normal, you will receive your results only by: MyChart Message (if you have MyChart) OR A paper copy in the mail If you have any lab test that is abnormal or we need to change your treatment, we will call you to review the results.  Testing/Procedures: none  Follow-Up: At [facility], you and your health needs are our priority.  As part of our continuing mission to provide you with exceptional heart care, our providers are all part of one team.  This team includes your primary Cardiologist (physician) and Advanced Practice Providers or APPs (Physician Assistants and Nurse Practitioners) who all work together to provide you with the care you need, when you need it.  Your next appointment:    Please keep your follow up as scheduled.   We have sent in a referral for home health PT and will reach out to vascular to see if Feb apts can be moved up.

## 2023-12-26 ENCOUNTER — Other Ambulatory Visit: Payer: Self-pay | Admitting: Internal Medicine

## 2023-12-26 DIAGNOSIS — Z1231 Encounter for screening mammogram for malignant neoplasm of breast: Secondary | ICD-10-CM

## 2023-12-27 ENCOUNTER — Other Ambulatory Visit: Payer: Self-pay | Admitting: Vascular Surgery

## 2023-12-27 DIAGNOSIS — M7989 Other specified soft tissue disorders: Secondary | ICD-10-CM

## 2023-12-29 ENCOUNTER — Ambulatory Visit: Payer: Medicare Other

## 2023-12-29 ENCOUNTER — Other Ambulatory Visit (HOSPITAL_COMMUNITY): Payer: Medicare Other

## 2024-01-02 DIAGNOSIS — M0579 Rheumatoid arthritis with rheumatoid factor of multiple sites without organ or systems involvement: Secondary | ICD-10-CM | POA: Diagnosis not present

## 2024-01-10 ENCOUNTER — Encounter: Payer: Self-pay | Admitting: Podiatry

## 2024-01-10 ENCOUNTER — Ambulatory Visit: Admitting: Podiatry

## 2024-01-10 DIAGNOSIS — M069 Rheumatoid arthritis, unspecified: Secondary | ICD-10-CM

## 2024-01-10 DIAGNOSIS — L97512 Non-pressure chronic ulcer of other part of right foot with fat layer exposed: Secondary | ICD-10-CM | POA: Diagnosis not present

## 2024-01-10 NOTE — Progress Notes (Signed)
 Subjective:  Patient ID: [name], female    DOB: [birth year],   MRN: [id number]  Chief Complaint  Patient presents with   Callouses    I have a callus on my right foot that is getting a blood blister and it's painful.  I have one on the left foot, it's not as bad.  My left ankle has started to bother me.  I think it may be Arthritis.  It's sore.    88 y.o. female presents for concern as above. She has a history of RA. She has been filing down and using corn pads but has recently worsened.  She relates some darkening around the area that has been present for about a couple weeks and presented to be seen.. Denies any other pedal complaints. Denies n/v/f/c.   Past Medical History:  Diagnosis Date   GERD (gastroesophageal reflux disease)    Hyperlipidemia    Hyperthyroidism    Hypothyroidism    Macular degeneration    Morbid obesity (HCC)    Osteoporosis    PONV (postoperative nausea and vomiting)    none recently   S/P TAVR (transcatheter aortic valve replacement) 01/03/2023   s/p TAVR with a 26 mm Medtronic FX via the TF approach by Dr. Wonda & Barle   Severe aortic stenosis    Shingles 04/21/2014   seen by Dr Shayne and treated with Valtrex    Objective:  Physical Exam: Vascular: DP/PT pulses 2/4 bilateral. CFT <3 seconds. Normal hair growth on digits. No edema.  Skin. No lacerations or abrasions bilateral feet. Hyperkeratotic lesion noted to third metatarsal on the right with ulceration noted underlying the hyperkeratosis.  Granular wound bed.  No erythema edema or purulence noted.  No probe to bone noted. Musculoskeletal: MMT 5/5 bilateral lower extremities in DF, PF, Inversion and Eversion. Deceased ROM in DF of ankle joint.  Neurological: Sensation intact to light touch.   Assessment:   1. Skin ulcer of fourth toe of right foot with fat layer exposed (HCC)   2. Rheumatoid arthritis involving both feet, unspecified whether rheumatoid factor present [facility])        Plan:  Patient was evaluated and treated and all questions answered. Ulcer plantar right fourth metatarsal head with fat layer exposed  -Debridement as below. -Dressed with betadine, DSD. -Off-loading with surgical shoe.Dispenesed.  -No abx indicated.  -Discussed glucose control and proper protein-rich diet.  -Discussed if any worsening redness, pain, fever or chills to call or may need to report to the emergency room. Patient expressed understanding.    Procedure: Excisional Debridement of Wound Tool: Sharp #312 chisel blade/tissue nipper Type of Debridement: Sharp Excisional Frequency: Every two weeks until appropriately healed.  Dressing is to be changed daily/keeping the wound clean and dry Rationale: Removal of non-viable soft tissue from the wound to promote healing.  Anesthesia: none Pre-Debridement Wound Measurements:Overlying callus  Post-Debridement Wound Measurements: 0.5 cm x 0.2 cm x 0.2 cm  Area devitalized tissue removed(nonviable tissue only): 0.5 cm x 0.2 cm.  Blood loss: Minimal (<10cc) Depth of Debridement: with fat layer exposed Description of tissue removed: Devitalized Tissue, Non-viable tissue, and Other: callsu  Technique: The wound and the surrounding skin were prepped and draped in usual aseptic fashion.  Aseptic technique was maintained throughout the procedure.  Using #312 blade/tissue nipper sharp debridement of necrotic/nonviable tissue was performed until healthy bleeding wound bed was achieved.  No underlying bone or tendon was exposed during debridement.  The wound was thoroughly irrigated with  normal saline solution Wound Progress:  Current Wound Volume: Debridement was performed of the chronic nonhealing diabetic foot wound on plantar fourth metatarsal head right foot .  Debridement removed 0.5 cm x 0.2 cm of the necrotic tissue and subcutaneous tissue and  purulent drainage was not present. Presence/absence of tissue: Necrotic tissue/nonviable  tissue present at the base of the wound.  Sharp debridement was performed to remove the necrotic tissue/nonviable tissue back to viable tissue.  No devitalized/nonviable tissue present postdebridement.  Wound appeared clean and clear of infection No material in the wound was present that was identified to be inhibiting healing. Dressing: Dry, sterile, compression dressing. Disposition: Patient tolerated procedure well. Patient to return in 2 weeks for follow-up or as listed above.  Return in about 2 weeks (around 01/24/2024) for wound check.    Asberry Failing, DPM

## 2024-01-19 DIAGNOSIS — H43811 Vitreous degeneration, right eye: Secondary | ICD-10-CM | POA: Diagnosis not present

## 2024-01-19 DIAGNOSIS — H353123 Nonexudative age-related macular degeneration, left eye, advanced atrophic without subfoveal involvement: Secondary | ICD-10-CM | POA: Diagnosis not present

## 2024-01-19 DIAGNOSIS — H35371 Puckering of macula, right eye: Secondary | ICD-10-CM | POA: Diagnosis not present

## 2024-01-19 DIAGNOSIS — H31093 Other chorioretinal scars, bilateral: Secondary | ICD-10-CM | POA: Diagnosis not present

## 2024-01-19 DIAGNOSIS — Z961 Presence of intraocular lens: Secondary | ICD-10-CM | POA: Diagnosis not present

## 2024-01-19 DIAGNOSIS — H353211 Exudative age-related macular degeneration, right eye, with active choroidal neovascularization: Secondary | ICD-10-CM | POA: Diagnosis not present

## 2024-01-24 ENCOUNTER — Ambulatory Visit: Admitting: Podiatry

## 2024-01-24 ENCOUNTER — Inpatient Hospital Stay: Admission: RE | Admit: 2024-01-24 | Discharge: 2024-01-24 | Attending: Internal Medicine | Admitting: Internal Medicine

## 2024-01-24 ENCOUNTER — Encounter: Payer: Self-pay | Admitting: Podiatry

## 2024-01-24 DIAGNOSIS — M19072 Primary osteoarthritis, left ankle and foot: Secondary | ICD-10-CM

## 2024-01-24 DIAGNOSIS — L84 Corns and callosities: Secondary | ICD-10-CM | POA: Diagnosis not present

## 2024-01-24 DIAGNOSIS — M069 Rheumatoid arthritis, unspecified: Secondary | ICD-10-CM

## 2024-01-24 DIAGNOSIS — Z1231 Encounter for screening mammogram for malignant neoplasm of breast: Secondary | ICD-10-CM

## 2024-01-24 NOTE — Progress Notes (Signed)
°  Subjective:  Patient ID: [name], female    DOB: 1935-07-04,   MRN: [id number]  Chief Complaint  Patient presents with   Wound Check    It's pretty much healed up.   Foot Pain    I have a sore ankle on my left foot.    88 y.o. female presents for concern as above. She has a history of RA. She has been filing down and using corn pads but has recently worsened.  She relates some darkening around the area that has been present for about a couple weeks and presented to be seen.. Denies any other pedal complaints. Denies n/v/f/c.   Past Medical History:  Diagnosis Date   GERD (gastroesophageal reflux disease)    Hyperlipidemia    Hyperthyroidism    Hypothyroidism    Macular degeneration    Morbid obesity (HCC)    Osteoporosis    PONV (postoperative nausea and vomiting)    none recently   S/P TAVR (transcatheter aortic valve replacement) 01/03/2023   s/p TAVR with a 26 mm Medtronic FX via the TF approach by Dr. Wonda & Barle   Severe aortic stenosis    Shingles 04/21/2014   seen by Dr Shayne and treated with Valtrex    Objective:  Physical Exam: Vascular: DP/PT pulses 2/4 bilateral. CFT <3 seconds. Normal hair growth on digits. No edema.  Skin. No lacerations or abrasions bilateral feet. Hyperkeratotic lesion noted to third metatarsal on the right with ulceration noted underlying the hyperkeratosis.  Granular wound bed.  No erythema edema or purulence noted.  No probe to bone noted. Musculoskeletal: MMT 5/5 bilateral lower extremities in DF, PF, Inversion and Eversion. Deceased ROM in DF of ankle joint.  Neurological: Sensation intact to light touch.   Assessment:   1. Pre-ulcerative calluses   2. Rheumatoid arthritis involving both feet, unspecified whether rheumatoid factor present (HCC)   3. Arthritis of left ankle        Plan:  Patient was evaluated and treated and all questions answered. Ulcer plantar right fourth metatarsal head healed d   -Debridement of hyperkeratotic tissue as courtesy with underlying ulceration healed. -Dressed with Band-Aid - May return to regular shoes. -No abx indicated.  -Discussed glucose control and proper protein-rich diet.  -Discussed if any worsening redness, pain, fever or chills to call or may need to report to the emergency room. Patient expressed understanding.   Discussed ankle arthritis with patient and treatment options.  Discussed NSAIDS, topicals, and possible injections.  Anti-inflammatories as needed Discussed trying an ankle brace.  Dispensed today Discussed if pain does not improve can discuss further options Patient to follow-up as needed.     Return in 4 weeks for wound check.  Return in about 4 weeks (around 02/21/2024) for wound check.    Asberry Failing, DPM

## 2024-01-29 ENCOUNTER — Other Ambulatory Visit: Payer: Self-pay | Admitting: Physician Assistant

## 2024-02-13 ENCOUNTER — Ambulatory Visit (HOSPITAL_COMMUNITY)
Admission: RE | Admit: 2024-02-13 | Discharge: 2024-02-13 | Disposition: A | Discharge status: Home / Self Care | Source: Ambulatory Visit | Attending: Surgery | Admitting: Surgery

## 2024-02-13 DIAGNOSIS — M7989 Other specified soft tissue disorders: Secondary | ICD-10-CM | POA: Insufficient documentation

## 2024-02-14 ENCOUNTER — Ambulatory Visit: Attending: Surgery | Admitting: Physician Assistant

## 2024-02-14 VITALS — BP 118/74 | HR 72 | Temp 97.7°F | Wt 198.7 lb

## 2024-02-14 DIAGNOSIS — I872 Venous insufficiency (chronic) (peripheral): Secondary | ICD-10-CM

## 2024-02-14 DIAGNOSIS — I890 Lymphedema, not elsewhere classified: Secondary | ICD-10-CM

## 2024-02-14 NOTE — Progress Notes (Signed)
 "   Requested by:  Katherine Jacob, NP [address] [place],  [place] [address]  Reason for consultation: BLE venous insufficiency     History of Present Illness   [name] is a 89 y.o. (Aug 29, 1935) female who presents for evaluation of bilateral lower extremity swelling. She explains that she has had some swelling for a while but then more recently within last 6 months she has had several bouts of extreme swelling. She had developed some rashes on her legs. Her legs also ache at times. She was seen by her Dermatologist and diagnosed with venous stasis dermatitis. She has started to elevate her legs more frequently with both her Lounge Doctor as well as bed that elevates her legs. This helped the rash resolve in addition to using zip up knee high compression stockings. She is unable to don and doff regular compression stockings so she has been using the knee high zipper ones. She has a caregiver that helps put these on and take them off a couple times a week. She otherwise is unable to use them on a daily basis.  She takes Torsemide  with minimal improvement. She does ambulate but not very much. She wears a brace on her left knee secondary to her patellar tendon reconstruction. She is scheduled to start PT tomorrow to help with her balance and mobility. This will be 2x/ week. She has no history of DVT. She does report that she has a sister who has had her veins ablated in the past.   Venous symptoms include: aching, tired,swelling, rash Onset/duration:  several years, progressing over past 6 months  Occupation:  retired Aggravating factors: sitting, standing Alleviating factors: elevation, compression  Compression:  yes Helps:  yes Pain medications:  Tramadol  Previous vein procedures:  no History of DVT:  no  Past Medical History:  Diagnosis Date   GERD (gastroesophageal reflux disease)    Hyperlipidemia    Hyperthyroidism    Hypothyroidism    Macular degeneration    Morbid  obesity (HCC)    Osteoporosis    PONV (postoperative nausea and vomiting)    none recently   S/P TAVR (transcatheter aortic valve replacement) 01/03/2023   s/p TAVR with a 26 mm Medtronic FX via the TF approach by Dr. Wonda & Barle   Severe aortic stenosis    Shingles 04/21/2014   seen by Dr Shayne and treated with Valtrex    Past Surgical History:  Procedure Laterality Date   ABDOMINAL HYSTERECTOMY  1984   ovarys left in   BREAST BIOPSY Right 2018   CHOLECYSTECTOMY  2005   DILATION AND CURETTAGE OF UTERUS     INTRAOPERATIVE TRANSTHORACIC ECHOCARDIOGRAM N/A 01/03/2023   Procedure: INTRAOPERATIVE TRANSTHORACIC ECHOCARDIOGRAM;  Surgeon: Wonda Sharper, MD;  Location: [facility] INVASIVE CV LAB;  Service: Cardiovascular;  Laterality: N/A;   JOINT REPLACEMENT  1990   right knee   KNEE RECONSTRUCTION Left 09/26/12   LEFT HEART CATH AND CORONARY ANGIOGRAPHY N/A 12/08/2022   Procedure: LEFT HEART CATH AND CORONARY ANGIOGRAPHY;  Surgeon: Wonda Sharper, MD;  Location: [facility] INVASIVE CV LAB;  Service: Cardiovascular;  Laterality: N/A;   left knee replacement  2012   x2 surgeries-ruptures patella tendon, fractured patella   PATELLAR TENDON REPAIR  07/21/2011   Procedure: PATELLA TENDON REPAIR;  Surgeon: Dempsey LULLA Moan, MD;  Location: [facility];  Service: Orthopedics;  Laterality: Left;  Left Knee Patella Tendon Reconstruction with allograft   QUADRICEPS TENDON REPAIR Left 09/26/2012   Procedure: LEFT EXTENSOR MECHANISM  RECONSTRUCTION/WITH GRAFT;  Surgeon: Dempsey LULLA Moan, MD;  Location: [facility];  Service: Orthopedics;  Laterality: Left;   REPAIR EXTENSOR TENDON Right 03/05/2015   Procedure: RECONSTRUCTION EXTENSOR HOOD RIGHT MIDDLE FINGER;  Surgeon: Arley Curia, MD;  Location: [facility];  Service: Orthopedics;  Laterality: Right;  ANESTHESIA:  [facility]   TONSILLECTOMY     as child   wrist bilateral  [id number]   for arthritis    Social History   Socioeconomic History   Marital  status: Divorced    Spouse name: Not on file   Number of children: 3   Years of education: 16   Highest education level: Bachelor's degree (e.g., BA, AB, BS)  Occupational History   Not on file  Tobacco Use   Smoking status: Former    Current packs/day: 0.00    Average packs/day: 0.5 packs/day for 15.0 years (7.5 ttl pk-yrs)    Types: Cigarettes    Start date: 02/07/1990    Quit date: 02/07/2005    Years since quitting: 19.0   Smokeless tobacco: Never  Vaping Use   Vaping status: Never Used  Substance and Sexual Activity   Alcohol  use: Not Currently    Comment: occasionally at Christmas time I may have a drink.   Drug use: No   Sexual activity: Not Currently  Other Topics Concern   Not on file  Social History Narrative   Not on file   Social Drivers of Health   Tobacco Use: Medium Risk (02/14/2024)   Patient History    Smoking Tobacco Use: Former    Smokeless Tobacco Use: Never    Passive Exposure: Not on Actuary Strain: Not on file  Food Insecurity: No Food Insecurity (01/03/2023)   Hunger Vital Sign    Worried About Running Out of Food in the Last Year: Never true    Ran Out of Food in the Last Year: Never true  Transportation Needs: No Transportation Needs (01/03/2023)   PRAPARE - Administrator, Civil Service (Medical): No    Lack of Transportation (Non-Medical): No  Physical Activity: Not on file  Stress: Not on file  Social Connections: Not on file  Intimate Partner Violence: Not At Risk (01/03/2023)   Humiliation, Afraid, Rape, and Kick questionnaire    Fear of Current or Ex-Partner: No    Emotionally Abused: No    Physically Abused: No    Sexually Abused: No  Depression (PHQ2-9): Low Risk (05/25/2023)   Depression (PHQ2-9)    PHQ-2 Score: 0  Alcohol  Screen: Not on file  Housing: Low Risk (01/03/2023)   Housing    Last Housing Risk Score: 0  Utilities: Not At Risk (01/03/2023)   AHC Utilities    Threatened with loss of  utilities: No  Health Literacy: Not on file    Family History  Problem Relation Age of Onset   Throat cancer Mother 66       smoker   Lung cancer Father 59       smoker   Arthritis/Rheumatoid Sister    Breast cancer Neg Hx     Current Outpatient Medications  Medication Sig Dispense Refill   acetaminophen  (TYLENOL ) 500 MG tablet Take 500 mg by mouth every 6 (six) hours as needed for headache.     aspirin  EC 81 MG tablet Take 1 tablet (81 mg total) by mouth daily. Swallow whole.     Cholecalciferol (VITAMIN D3) 250 MCG (10000 UT) capsule Take 10,000  Units by mouth every Monday, Wednesday, and Friday.     diphenhydrAMINE  (BENADRYL  ALLERGY) 25 MG tablet Take 12.5 mg by mouth 2 (two) times daily.     diphenhydrAMINE  (BENADRYL ) 50 MG/ML injection 50 mg Injection with infusions     estrogens, conjugated, (PREMARIN) 0.3 MG tablet Take 0.3 mg by mouth daily with breakfast.      folic acid  (FOLVITE ) 1 MG tablet Take 1 mg by mouth daily with breakfast.     methotrexate (RHEUMATREX) 2.5 MG tablet Take 7.5 mg by mouth 2 (two) times a week. Caution:Chemotherapy. Protect from light. On Saturdays and Sundays     Multiple Vitamins-Minerals (PRESERVISION AREDS 2 PO) Take 1 capsule by mouth 2 (two) times daily.     OVER THE COUNTER MEDICATION Take 1 tablet by mouth daily. [employer] supplement     potassium chloride  SA (KLOR-CON  M20) 20 MEQ tablet Take 1 tablet (20 mEq total) by mouth daily. 30 tablet 3   Propylene Glycol (SYSTANE BALANCE OP) Place 1 drop into both eyes 2 (two) times daily as needed (dry eyes).     Psyllium (METAMUCIL 3 IN 1 DAILY FIBER PO) Take 1-2 capsules by mouth daily.     rosuvastatin  (CRESTOR ) 20 MG tablet Take 20 mg by mouth daily.     saccharomyces boulardii (FLORASTOR) 250 MG capsule Take 250 mg by mouth every other day.     tocilizumab  (ACTEMRA ) 400 MG/20ML SOLN injection Inject into the vein every 30 (thirty) days.     torsemide  (DEMADEX ) 20 MG tablet Take 1 tablet (20 mg  total) by mouth daily. 30 tablet 3   traMADol  (ULTRAM ) 50 MG tablet Take 1-2 tablets (50-100 mg total) by mouth every 6 (six) hours as needed. (Patient taking differently: Take 25 mg by mouth daily.) 80 tablet 1   vitamin C (ASCORBIC ACID) 500 MG tablet Take 500 mg by mouth every other day.     No current facility-administered medications for this visit.    Allergies[1]  REVIEW OF SYSTEMS (negative unless checked):   Cardiac:  []  Chest pain or chest pressure? []  Shortness of breath upon activity? []  Shortness of breath when lying flat? []  Irregular heart rhythm?  Vascular:  []  Pain in calf, thigh, or hip brought on by walking? []  Pain in feet at night that wakes you up from your sleep? []  Blood clot in your veins? [x]  Leg swelling?  Pulmonary:  []  Oxygen at home? []  Productive cough? []  Wheezing?  Neurologic:  []  Sudden weakness in arms or legs? []  Sudden numbness in arms or legs? []  Sudden onset of difficult speaking or slurred speech? []  Temporary loss of vision in one eye? []  Problems with dizziness?  Gastrointestinal:  []  Blood in stool? []  Vomited blood?  Genitourinary:  []  Burning when urinating? []  Blood in urine?  Psychiatric:  []  Major depression  Hematologic:  []  Bleeding problems? []  Problems with blood clotting?  Dermatologic:  []  Rashes or ulcers?  Constitutional:  []  Fever or chills?  Ear/Nose/Throat:  []  Change in hearing? []  Nose bleeds? []  Sore throat?  Musculoskeletal:  []  Back pain? []  Joint pain? []  Muscle pain?   Physical Examination     Vitals:   02/14/24 0830  BP: 118/74  Pulse: 72  Temp: 97.7 F (36.5 C)  TempSrc: Temporal  Weight: 198 lb 11.2 oz (90.1 kg)   Body mass index is 34.65 kg/m.  General:  WDWN in NAD; vital signs documented above Gait: Normal HENT: WNL, normocephalic Pulmonary: normal  non-labored breathing Cardiac: regular HR Abdomen: soft Vascular Exam/Pulses: Extremities: without varicose  veins, without reticular veins, with edema, without stasis pigmentation, without lipodermatosclerosis, without ulcers Musculoskeletal: no muscle wasting or atrophy  Neurologic: A&O X 3;  No focal weakness or paresthesias are detected Psychiatric:  The pt has Normal affect.  Non-invasive Vascular Imaging   BLE Venous Insufficiency Duplex (02/14/24):  LLE: No DVT and SVT GSV reflux at [facility] GSV diameter 0.31-0.36 cm No SSV reflux  CFV deep venous reflux   Medical Decision Making   DIAHN WAIDELICH is a 89 y.o. female who presents with: LLE chronic venous insufficiency with edema bilaterally and I suspect mixed lymphedema. Duplex shows no DVT or SVT. She does have some incompetence in the CFV and at the [facility] in the greater saphenous vein. Otherwise her deep system and superficial venous system are competent. No SSV reflux. Based on these findings she is not a candidate for a venous ablation. I discussed with her that clinically she appears to have lymphedema as well, which is managed in the same way.  I will make recommendation to her PCP to refer her to a lymphedema clinic as well  Based on the patient's history and examination, I recommend: daily elevation of 20-30 minutes above level of heart, daily compression stocking use, exercise, weight reduction, refraining from prolonged sitting or standing. I discussed with the patient the use of her 15-20 mm knee high compression stockings Patient was provided with information about vein health She can follow up as needed if she has new or worsening symptoms   Teretha Damme, PA-C [facility] Office: [phone]  02/14/2024, 9:15 AM  Clinic MD: Sheree     [1]  Allergies Allergen Reactions   Atorvastatin      hands hurt   Morphine And Codeine Other (See Comments)    makes me queezy   Sulfa Antibiotics Hives    Hives as child-age 26   "

## 2024-03-05 ENCOUNTER — Ambulatory Visit: Admitting: Podiatry

## 2024-03-12 ENCOUNTER — Ambulatory Visit: Admitting: Podiatry

## 2024-04-04 ENCOUNTER — Encounter

## 2024-04-04 ENCOUNTER — Ambulatory Visit (HOSPITAL_COMMUNITY)

## 2024-04-16 ENCOUNTER — Ambulatory Visit: Admitting: Podiatry

## 2024-05-03 ENCOUNTER — Ambulatory Visit: Admitting: Cardiovascular Disease
